# Patient Record
Sex: Female | Born: 1955 | Race: White | Hispanic: No | Marital: Married | Smoking: Current every day smoker
Health system: Southern US, Community
[De-identification: ages and names within clinical notes are randomized; demographics above are authoritative.]

## PROBLEM LIST (undated history)

## (undated) DIAGNOSIS — I10 Essential (primary) hypertension: Secondary | ICD-10-CM

## (undated) DIAGNOSIS — F419 Anxiety disorder, unspecified: Secondary | ICD-10-CM

## (undated) DIAGNOSIS — J449 Chronic obstructive pulmonary disease, unspecified: Secondary | ICD-10-CM

## (undated) HISTORY — PX: ABDOMINAL HYSTERECTOMY: SHX81

---

## 2005-02-06 ENCOUNTER — Emergency Department: Payer: Self-pay | Admitting: Emergency Medicine

## 2006-04-07 ENCOUNTER — Other Ambulatory Visit: Payer: Self-pay

## 2006-04-07 ENCOUNTER — Emergency Department: Payer: Self-pay | Admitting: Emergency Medicine

## 2006-09-15 ENCOUNTER — Emergency Department: Payer: Self-pay | Admitting: Emergency Medicine

## 2007-04-19 ENCOUNTER — Inpatient Hospital Stay: Payer: Self-pay | Admitting: Internal Medicine

## 2007-04-19 ENCOUNTER — Other Ambulatory Visit: Payer: Self-pay

## 2009-05-02 ENCOUNTER — Emergency Department: Payer: Self-pay | Admitting: Emergency Medicine

## 2009-08-04 ENCOUNTER — Emergency Department: Payer: Self-pay | Admitting: Emergency Medicine

## 2010-06-05 ENCOUNTER — Emergency Department: Payer: Self-pay | Admitting: Emergency Medicine

## 2011-11-24 ENCOUNTER — Inpatient Hospital Stay: Payer: Self-pay | Admitting: Internal Medicine

## 2011-12-10 ENCOUNTER — Emergency Department: Payer: Self-pay | Admitting: Emergency Medicine

## 2012-05-13 ENCOUNTER — Inpatient Hospital Stay: Payer: Self-pay | Admitting: Internal Medicine

## 2012-05-13 LAB — URINALYSIS, COMPLETE
Bacteria: NONE SEEN
Bilirubin,UR: NEGATIVE
Blood: NEGATIVE
Glucose,UR: NEGATIVE mg/dL
Leukocyte Esterase: NEGATIVE
Nitrite: NEGATIVE
Ph: 7
Protein: NEGATIVE
RBC,UR: <1 /HPF
Specific Gravity: 1.009
Squamous Epithelial: <1
WBC UR: 1 /HPF

## 2012-05-13 LAB — CBC
HGB: 14.5 g/dL (ref 12.0–16.0)
MCH: 30.2 pg (ref 26.0–34.0)
MCHC: 33.5 g/dL (ref 32.0–36.0)
MCV: 90 fL (ref 80–100)
RDW: 13.6 % (ref 11.5–14.5)

## 2012-05-13 LAB — COMPREHENSIVE METABOLIC PANEL
Albumin: 3.7 g/dL (ref 3.4–5.0)
Alkaline Phosphatase: 137 U/L — ABNORMAL HIGH (ref 50–136)
Bilirubin,Total: 0.4 mg/dL (ref 0.2–1.0)
Calcium, Total: 8.9 mg/dL (ref 8.5–10.1)
Chloride: 106 mmol/L (ref 98–107)
Co2: 29 mmol/L (ref 21–32)
EGFR (African American): >60
EGFR (Non-African Amer.): >60
Potassium: 3.2 mmol/L — ABNORMAL LOW (ref 3.5–5.1)
SGOT(AST): 18 U/L (ref 15–37)
Sodium: 144 mmol/L (ref 136–145)

## 2012-05-13 LAB — CK TOTAL AND CKMB (NOT AT ARMC): CK, Total: 45 U/L (ref 21–215)

## 2012-05-13 LAB — TROPONIN I: Troponin-I: <0.02 ng/mL

## 2012-05-14 LAB — BASIC METABOLIC PANEL
Anion Gap: 8 (ref 7–16)
Chloride: 107 mmol/L (ref 98–107)
Co2: 27 mmol/L (ref 21–32)
EGFR (African American): >60
Glucose: 156 mg/dL — ABNORMAL HIGH (ref 65–99)
Osmolality: 286 (ref 275–301)
Potassium: 4 mmol/L (ref 3.5–5.1)
Sodium: 142 mmol/L (ref 136–145)

## 2012-06-06 ENCOUNTER — Observation Stay: Payer: Self-pay | Admitting: Internal Medicine

## 2012-06-06 LAB — COMPREHENSIVE METABOLIC PANEL
Alkaline Phosphatase: 130 U/L (ref 50–136)
Anion Gap: 7 (ref 7–16)
BUN: 13 mg/dL (ref 7–18)
Bilirubin,Total: 1.2 mg/dL — ABNORMAL HIGH (ref 0.2–1.0)
Calcium, Total: 8.9 mg/dL (ref 8.5–10.1)
Chloride: 106 mmol/L (ref 98–107)
Co2: 28 mmol/L (ref 21–32)
Creatinine: 0.45 mg/dL — ABNORMAL LOW (ref 0.60–1.30)
EGFR (African American): >60
EGFR (Non-African Amer.): >60
Glucose: 122 mg/dL — ABNORMAL HIGH (ref 65–99)
SGPT (ALT): 20 U/L
Total Protein: 6.8 g/dL (ref 6.4–8.2)

## 2012-06-06 LAB — CBC
HCT: 45 % (ref 35.0–47.0)
HGB: 15 g/dL (ref 12.0–16.0)
MCH: 30.6 pg (ref 26.0–34.0)
MCHC: 33.2 g/dL (ref 32.0–36.0)
Platelet: 146 10*3/uL — ABNORMAL LOW (ref 150–440)
RBC: 4.89 10*6/uL (ref 3.80–5.20)
WBC: 5.2 10*3/uL (ref 3.6–11.0)

## 2012-06-11 LAB — CULTURE, BLOOD (SINGLE)

## 2012-06-24 ENCOUNTER — Inpatient Hospital Stay: Payer: Self-pay | Admitting: Internal Medicine

## 2012-06-24 LAB — BASIC METABOLIC PANEL
BUN: 14 mg/dL (ref 7–18)
Calcium, Total: 9.1 mg/dL (ref 8.5–10.1)
Chloride: 105 mmol/L (ref 98–107)
Co2: 32 mmol/L (ref 21–32)
EGFR (Non-African Amer.): >60
Glucose: 94 mg/dL (ref 65–99)
Osmolality: 283 (ref 275–301)
Potassium: 3.8 mmol/L (ref 3.5–5.1)
Sodium: 142 mmol/L (ref 136–145)

## 2012-06-25 LAB — CBC WITH DIFFERENTIAL/PLATELET
Basophil #: 0 10*3/uL (ref 0.0–0.1)
Eosinophil #: 0 10*3/uL (ref 0.0–0.7)
HCT: 38.4 % (ref 35.0–47.0)
Lymphocyte #: 0.6 10*3/uL — ABNORMAL LOW (ref 1.0–3.6)
Lymphocyte %: 6.1 %
MCH: 30.8 pg (ref 26.0–34.0)
MCHC: 33.6 g/dL (ref 32.0–36.0)
MCV: 92 fL (ref 80–100)
Monocyte #: 0.2 x10 3/mm (ref 0.2–0.9)
Monocyte %: 1.7 %
Neutrophil #: 9.5 10*3/uL — ABNORMAL HIGH (ref 1.4–6.5)
RBC: 4.18 10*6/uL (ref 3.80–5.20)
RDW: 13.3 % (ref 11.5–14.5)

## 2012-07-09 ENCOUNTER — Emergency Department: Payer: Self-pay | Admitting: Emergency Medicine

## 2012-07-09 LAB — CBC
HGB: 13.3 g/dL (ref 12.0–16.0)
MCH: 31 pg (ref 26.0–34.0)
MCV: 93 fL (ref 80–100)
Platelet: 152 10*3/uL (ref 150–440)
RDW: 14 % (ref 11.5–14.5)
WBC: 10.4 10*3/uL (ref 3.6–11.0)

## 2012-07-09 LAB — URINALYSIS, COMPLETE
Bilirubin,UR: NEGATIVE
Glucose,UR: NEGATIVE mg/dL (ref 0–75)
Ketone: NEGATIVE
Leukocyte Esterase: NEGATIVE
Ph: 6 (ref 4.5–8.0)
Protein: NEGATIVE
RBC,UR: <1 /HPF (ref 0–5)
Specific Gravity: 1.004 (ref 1.003–1.030)
Squamous Epithelial: <1
WBC UR: <1 /HPF (ref 0–5)

## 2012-07-09 LAB — BASIC METABOLIC PANEL
Anion Gap: 7 (ref 7–16)
BUN: 6 mg/dL — ABNORMAL LOW (ref 7–18)
Calcium, Total: 8.5 mg/dL (ref 8.5–10.1)
Chloride: 106 mmol/L (ref 98–107)
Co2: 31 mmol/L (ref 21–32)
Glucose: 85 mg/dL (ref 65–99)
Osmolality: 284 (ref 275–301)
Potassium: 3.5 mmol/L (ref 3.5–5.1)
Sodium: 144 mmol/L (ref 136–145)

## 2012-07-09 LAB — CK TOTAL AND CKMB (NOT AT ARMC)
CK, Total: 29 U/L (ref 21–215)
CK-MB: <0.5 ng/mL — ABNORMAL LOW (ref 0.5–3.6)

## 2012-07-10 LAB — URINE CULTURE

## 2012-08-20 ENCOUNTER — Emergency Department: Payer: Self-pay | Admitting: Emergency Medicine

## 2012-08-20 LAB — CBC WITH DIFFERENTIAL/PLATELET
Basophil %: 0.8 %
Eosinophil #: 0 10*3/uL (ref 0.0–0.7)
Eosinophil %: 0.8 %
HCT: 43.6 % (ref 35.0–47.0)
HGB: 15 g/dL (ref 12.0–16.0)
MCH: 31.8 pg (ref 26.0–34.0)
MCV: 92 fL (ref 80–100)
Monocyte %: 7.7 %
Neutrophil #: 2.4 10*3/uL (ref 1.4–6.5)
Neutrophil %: 54.8 %
Platelet: 223 10*3/uL (ref 150–440)
RBC: 4.71 10*6/uL (ref 3.80–5.20)
WBC: 4.4 10*3/uL (ref 3.6–11.0)

## 2012-08-20 LAB — COMPREHENSIVE METABOLIC PANEL
Alkaline Phosphatase: 118 U/L (ref 50–136)
BUN: 12 mg/dL (ref 7–18)
Calcium, Total: 9 mg/dL (ref 8.5–10.1)
Co2: 30 mmol/L (ref 21–32)
EGFR (African American): >60
EGFR (Non-African Amer.): >60
SGOT(AST): 28 U/L (ref 15–37)
SGPT (ALT): 15 U/L (ref 12–78)

## 2013-01-05 ENCOUNTER — Emergency Department: Payer: Self-pay | Admitting: Emergency Medicine

## 2013-04-19 ENCOUNTER — Emergency Department: Payer: Self-pay | Admitting: Emergency Medicine

## 2013-04-19 LAB — BASIC METABOLIC PANEL
Anion Gap: 12 (ref 7–16)
BUN: 10 mg/dL (ref 7–18)
Chloride: 106 mmol/L (ref 98–107)
Co2: 25 mmol/L (ref 21–32)
Creatinine: 0.69 mg/dL (ref 0.60–1.30)
EGFR (African American): >60
EGFR (Non-African Amer.): >60
Glucose: 103 mg/dL — ABNORMAL HIGH (ref 65–99)
Osmolality: 284 (ref 275–301)
Sodium: 143 mmol/L (ref 136–145)

## 2013-04-19 LAB — CBC
MCHC: 33.8 g/dL (ref 32.0–36.0)
MCV: 91 fL (ref 80–100)
Platelet: 230 10*3/uL (ref 150–440)
RBC: 5.03 10*6/uL (ref 3.80–5.20)
RDW: 13.6 % (ref 11.5–14.5)
WBC: 14.1 10*3/uL — ABNORMAL HIGH (ref 3.6–11.0)

## 2013-04-19 LAB — TROPONIN I: Troponin-I: <0.02 ng/mL

## 2013-04-25 LAB — CULTURE, BLOOD (SINGLE)

## 2013-06-08 ENCOUNTER — Inpatient Hospital Stay: Payer: Self-pay | Admitting: Internal Medicine

## 2013-06-08 LAB — COMPREHENSIVE METABOLIC PANEL
Alkaline Phosphatase: 124 U/L (ref 50–136)
BUN: 9 mg/dL (ref 7–18)
Bilirubin,Total: 0.4 mg/dL (ref 0.2–1.0)
Calcium, Total: 8.8 mg/dL (ref 8.5–10.1)
Chloride: 106 mmol/L (ref 98–107)
Co2: 28 mmol/L (ref 21–32)
Creatinine: 0.6 mg/dL (ref 0.60–1.30)
Glucose: 123 mg/dL — ABNORMAL HIGH (ref 65–99)
Osmolality: 279 (ref 275–301)
Potassium: 3.4 mmol/L — ABNORMAL LOW (ref 3.5–5.1)
SGOT(AST): 14 U/L — ABNORMAL LOW (ref 15–37)
SGPT (ALT): 14 U/L (ref 12–78)

## 2013-06-08 LAB — URINALYSIS, COMPLETE
Bacteria: NONE SEEN
Glucose,UR: >=500 mg/dL (ref 0–75)
Ketone: NEGATIVE
Nitrite: NEGATIVE
WBC UR: 6 /HPF (ref 0–5)

## 2013-06-08 LAB — TROPONIN I: Troponin-I: <0.02 ng/mL

## 2013-06-08 LAB — CBC
HCT: 41.2 % (ref 35.0–47.0)
HGB: 14.3 g/dL (ref 12.0–16.0)
MCH: 31.3 pg (ref 26.0–34.0)
MCHC: 34.6 g/dL (ref 32.0–36.0)
MCV: 90 fL (ref 80–100)
Platelet: 171 10*3/uL (ref 150–440)
RBC: 4.56 10*6/uL (ref 3.80–5.20)
RDW: 13.2 % (ref 11.5–14.5)
WBC: 10.4 10*3/uL (ref 3.6–11.0)

## 2013-06-08 LAB — CK TOTAL AND CKMB (NOT AT ARMC)
CK, Total: 36 U/L (ref 21–215)
CK-MB: 0.9 ng/mL (ref 0.5–3.6)

## 2013-06-08 LAB — PRO B NATRIURETIC PEPTIDE: B-Type Natriuretic Peptide: 239 pg/mL — ABNORMAL HIGH (ref 0–125)

## 2013-06-08 LAB — MAGNESIUM: Magnesium: 1.5 mg/dL — ABNORMAL LOW

## 2013-06-09 LAB — BASIC METABOLIC PANEL
Anion Gap: 5 — ABNORMAL LOW (ref 7–16)
BUN: 14 mg/dL (ref 7–18)
Co2: 29 mmol/L (ref 21–32)
Creatinine: 0.62 mg/dL (ref 0.60–1.30)
EGFR (African American): >60
EGFR (Non-African Amer.): >60
Glucose: 182 mg/dL — ABNORMAL HIGH (ref 65–99)
Osmolality: 285 (ref 275–301)

## 2013-06-09 LAB — CBC WITH DIFFERENTIAL/PLATELET
Basophil %: 0.3 %
Eosinophil #: 0 10*3/uL (ref 0.0–0.7)
Eosinophil %: 0 %
HCT: 40 % (ref 35.0–47.0)
Lymphocyte #: 0.5 10*3/uL — ABNORMAL LOW (ref 1.0–3.6)
Lymphocyte %: 8 %
MCH: 30.7 pg (ref 26.0–34.0)
MCHC: 33.9 g/dL (ref 32.0–36.0)
MCV: 91 fL (ref 80–100)

## 2013-06-14 ENCOUNTER — Emergency Department: Payer: Self-pay | Admitting: Emergency Medicine

## 2013-06-14 LAB — COMPREHENSIVE METABOLIC PANEL
Albumin: 3.5 g/dL (ref 3.4–5.0)
Alkaline Phosphatase: 134 U/L (ref 50–136)
Bilirubin,Total: 0.3 mg/dL (ref 0.2–1.0)
Calcium, Total: 8.8 mg/dL (ref 8.5–10.1)
Chloride: 105 mmol/L (ref 98–107)
Co2: 30 mmol/L (ref 21–32)
EGFR (African American): >60
Glucose: 110 mg/dL — ABNORMAL HIGH (ref 65–99)
Osmolality: 282 (ref 275–301)
SGOT(AST): 15 U/L (ref 15–37)
Sodium: 142 mmol/L (ref 136–145)
Total Protein: 6.7 g/dL (ref 6.4–8.2)

## 2013-06-14 LAB — CBC
HCT: 42.2 % (ref 35.0–47.0)
MCH: 30.7 pg (ref 26.0–34.0)
MCHC: 34.1 g/dL (ref 32.0–36.0)
Platelet: 283 10*3/uL (ref 150–440)
RBC: 4.69 10*6/uL (ref 3.80–5.20)
RDW: 13.2 % (ref 11.5–14.5)

## 2013-07-17 ENCOUNTER — Emergency Department: Payer: Self-pay | Admitting: Emergency Medicine

## 2013-07-17 LAB — BASIC METABOLIC PANEL WITH GFR
Anion Gap: 6 — ABNORMAL LOW (ref 7–16)
BUN: 10 mg/dL (ref 7–18)
Calcium, Total: 9.1 mg/dL (ref 8.5–10.1)
Chloride: 109 mmol/L — ABNORMAL HIGH (ref 98–107)
Co2: 27 mmol/L (ref 21–32)
Creatinine: 0.43 mg/dL — ABNORMAL LOW (ref 0.60–1.30)
EGFR (African American): >60
EGFR (Non-African Amer.): >60
Glucose: 100 mg/dL — ABNORMAL HIGH (ref 65–99)
Osmolality: 282 (ref 275–301)
Potassium: 4.3 mmol/L (ref 3.5–5.1)
Sodium: 142 mmol/L (ref 136–145)

## 2013-07-17 LAB — CBC
HCT: 44.1 % (ref 35.0–47.0)
MCHC: 34.1 g/dL (ref 32.0–36.0)
MCV: 88 fL (ref 80–100)
Platelet: 181 10*3/uL (ref 150–440)
RBC: 5 10*6/uL (ref 3.80–5.20)
RDW: 13.1 % (ref 11.5–14.5)
WBC: 5.2 10*3/uL (ref 3.6–11.0)

## 2013-07-17 LAB — TROPONIN I: Troponin-I: <0.02 ng/mL

## 2013-12-04 ENCOUNTER — Emergency Department: Payer: Self-pay | Admitting: Emergency Medicine

## 2013-12-04 LAB — URINALYSIS, COMPLETE
Bacteria: NONE SEEN
Bilirubin,UR: NEGATIVE
Glucose,UR: NEGATIVE mg/dL (ref 0–75)
Ketone: NEGATIVE
Ph: 7 (ref 4.5–8.0)
Squamous Epithelial: <1
WBC UR: 1 /HPF (ref 0–5)

## 2013-12-04 LAB — BASIC METABOLIC PANEL
Anion Gap: 4 — ABNORMAL LOW (ref 7–16)
BUN: 10 mg/dL (ref 7–18)
Calcium, Total: 9 mg/dL (ref 8.5–10.1)
Chloride: 106 mmol/L (ref 98–107)
Sodium: 139 mmol/L (ref 136–145)

## 2013-12-04 LAB — CBC WITH DIFFERENTIAL/PLATELET
Basophil #: 0.1 10*3/uL (ref 0.0–0.1)
Basophil %: 0.9 %
Eosinophil #: 0 10*3/uL (ref 0.0–0.7)
Eosinophil %: 0.5 %
HCT: 43.8 % (ref 35.0–47.0)
Lymphocyte #: 1.4 10*3/uL (ref 1.0–3.6)
Lymphocyte %: 24 %
MCH: 31.1 pg (ref 26.0–34.0)
MCHC: 34.6 g/dL (ref 32.0–36.0)
MCV: 90 fL (ref 80–100)
Monocyte #: 0.4 x10 3/mm (ref 0.2–0.9)
RBC: 4.87 10*6/uL (ref 3.80–5.20)
RDW: 13.7 % (ref 11.5–14.5)
WBC: 5.7 10*3/uL (ref 3.6–11.0)

## 2014-02-04 ENCOUNTER — Emergency Department: Payer: Self-pay | Admitting: Emergency Medicine

## 2014-02-04 LAB — COMPREHENSIVE METABOLIC PANEL
ALBUMIN: 3.7 g/dL (ref 3.4–5.0)
ANION GAP: 3 — AB (ref 7–16)
Alkaline Phosphatase: 129 U/L — ABNORMAL HIGH
BUN: 14 mg/dL (ref 7–18)
Bilirubin,Total: 0.2 mg/dL (ref 0.2–1.0)
CO2: 32 mmol/L (ref 21–32)
CREATININE: 0.63 mg/dL (ref 0.60–1.30)
Calcium, Total: 9.1 mg/dL (ref 8.5–10.1)
Chloride: 107 mmol/L (ref 98–107)
EGFR (African American): >60
EGFR (Non-African Amer.): >60
Glucose: 125 mg/dL — ABNORMAL HIGH (ref 65–99)
Osmolality: 285 (ref 275–301)
POTASSIUM: 3.7 mmol/L (ref 3.5–5.1)
SGOT(AST): 13 U/L — ABNORMAL LOW (ref 15–37)
SGPT (ALT): 15 U/L (ref 12–78)
Sodium: 142 mmol/L (ref 136–145)
TOTAL PROTEIN: 6.5 g/dL (ref 6.4–8.2)

## 2014-02-04 LAB — TROPONIN I

## 2014-02-05 LAB — CBC
HCT: 41.4 % (ref 35.0–47.0)
HGB: 14.1 g/dL (ref 12.0–16.0)
MCH: 31.7 pg (ref 26.0–34.0)
MCHC: 34.1 g/dL (ref 32.0–36.0)
MCV: 93 fL (ref 80–100)
Platelet: 217 10*3/uL (ref 150–440)
RBC: 4.46 10*6/uL (ref 3.80–5.20)
RDW: 13.3 % (ref 11.5–14.5)
WBC: 6.2 10*3/uL (ref 3.6–11.0)

## 2014-05-30 ENCOUNTER — Observation Stay: Payer: Self-pay | Admitting: Internal Medicine

## 2014-05-30 LAB — BASIC METABOLIC PANEL
ANION GAP: 6 — AB (ref 7–16)
BUN: 10 mg/dL (ref 7–18)
CREATININE: 0.5 mg/dL — AB (ref 0.60–1.30)
Calcium, Total: 8.7 mg/dL (ref 8.5–10.1)
Chloride: 107 mmol/L (ref 98–107)
Co2: 29 mmol/L (ref 21–32)
EGFR (African American): >60
Glucose: 92 mg/dL (ref 65–99)
Osmolality: 282 (ref 275–301)
POTASSIUM: 3.4 mmol/L — AB (ref 3.5–5.1)
Sodium: 142 mmol/L (ref 136–145)

## 2014-05-30 LAB — CBC
HCT: 42.6 % (ref 35.0–47.0)
HGB: 14 g/dL (ref 12.0–16.0)
MCH: 30.5 pg (ref 26.0–34.0)
MCHC: 33 g/dL (ref 32.0–36.0)
MCV: 93 fL (ref 80–100)
Platelet: 193 10*3/uL (ref 150–440)
RBC: 4.6 10*6/uL (ref 3.80–5.20)
RDW: 13 % (ref 11.5–14.5)
WBC: 4.6 10*3/uL (ref 3.6–11.0)

## 2014-05-30 LAB — TROPONIN I: Troponin-I: <0.02 ng/mL

## 2014-05-31 LAB — CBC WITH DIFFERENTIAL/PLATELET
BASOS PCT: 0.1 %
Basophil #: 0 10*3/uL (ref 0.0–0.1)
EOS PCT: 0 %
Eosinophil #: 0 10*3/uL (ref 0.0–0.7)
HCT: 38.2 % (ref 35.0–47.0)
HGB: 12.6 g/dL (ref 12.0–16.0)
LYMPHS PCT: 11.6 %
Lymphocyte #: 0.9 10*3/uL — ABNORMAL LOW (ref 1.0–3.6)
MCH: 30.5 pg (ref 26.0–34.0)
MCHC: 33.1 g/dL (ref 32.0–36.0)
MCV: 92 fL (ref 80–100)
Monocyte #: 0.5 x10 3/mm (ref 0.2–0.9)
Monocyte %: 6.7 %
Neutrophil #: 6.6 10*3/uL — ABNORMAL HIGH (ref 1.4–6.5)
Neutrophil %: 81.6 %
Platelet: 194 10*3/uL (ref 150–440)
RBC: 4.15 10*6/uL (ref 3.80–5.20)
RDW: 13 % (ref 11.5–14.5)
WBC: 8.1 10*3/uL (ref 3.6–11.0)

## 2014-05-31 LAB — BASIC METABOLIC PANEL
ANION GAP: 3 — AB (ref 7–16)
BUN: 9 mg/dL (ref 7–18)
CALCIUM: 9.1 mg/dL (ref 8.5–10.1)
Chloride: 106 mmol/L (ref 98–107)
Co2: 32 mmol/L (ref 21–32)
Creatinine: 0.54 mg/dL — ABNORMAL LOW (ref 0.60–1.30)
EGFR (African American): >60
EGFR (Non-African Amer.): >60
Glucose: 152 mg/dL — ABNORMAL HIGH (ref 65–99)
OSMOLALITY: 283 (ref 275–301)
Potassium: 3.9 mmol/L (ref 3.5–5.1)
Sodium: 141 mmol/L (ref 136–145)

## 2014-09-05 ENCOUNTER — Inpatient Hospital Stay: Payer: Self-pay | Admitting: Internal Medicine

## 2014-09-05 LAB — CBC
HCT: 40.1 % (ref 35.0–47.0)
HGB: 13.8 g/dL (ref 12.0–16.0)
MCH: 31.7 pg (ref 26.0–34.0)
MCHC: 34.5 g/dL (ref 32.0–36.0)
MCV: 92 fL (ref 80–100)
Platelet: 188 10*3/uL (ref 150–440)
RBC: 4.36 10*6/uL (ref 3.80–5.20)
RDW: 13.3 % (ref 11.5–14.5)
WBC: 7.6 10*3/uL (ref 3.6–11.0)

## 2014-09-05 LAB — BASIC METABOLIC PANEL
Anion Gap: 8 (ref 7–16)
BUN: 14 mg/dL (ref 7–18)
CALCIUM: 8.3 mg/dL — AB (ref 8.5–10.1)
Chloride: 109 mmol/L — ABNORMAL HIGH (ref 98–107)
Co2: 29 mmol/L (ref 21–32)
Creatinine: 0.59 mg/dL — ABNORMAL LOW (ref 0.60–1.30)
EGFR (African American): >60
EGFR (Non-African Amer.): >60
Glucose: 95 mg/dL (ref 65–99)
OSMOLALITY: 291 (ref 275–301)
Potassium: 3.6 mmol/L (ref 3.5–5.1)
Sodium: 146 mmol/L — ABNORMAL HIGH (ref 136–145)

## 2014-09-05 LAB — TROPONIN I: Troponin-I: <0.02 ng/mL

## 2015-01-11 ENCOUNTER — Inpatient Hospital Stay: Payer: Self-pay | Admitting: Internal Medicine

## 2015-01-11 LAB — CBC
HCT: 44.7 % (ref 35.0–47.0)
HGB: 14.7 g/dL (ref 12.0–16.0)
MCH: 30.4 pg (ref 26.0–34.0)
MCHC: 32.8 g/dL (ref 32.0–36.0)
MCV: 93 fL (ref 80–100)
PLATELETS: 181 10*3/uL (ref 150–440)
RBC: 4.82 10*6/uL (ref 3.80–5.20)
RDW: 13 % (ref 11.5–14.5)
WBC: 3.8 10*3/uL (ref 3.6–11.0)

## 2015-01-11 LAB — BASIC METABOLIC PANEL
Anion Gap: 4 — ABNORMAL LOW (ref 7–16)
BUN: 9 mg/dL (ref 7–18)
CHLORIDE: 108 mmol/L — AB (ref 98–107)
CO2: 32 mmol/L (ref 21–32)
CREATININE: 0.48 mg/dL — AB (ref 0.60–1.30)
Calcium, Total: 8.5 mg/dL (ref 8.5–10.1)
EGFR (African American): >60
EGFR (Non-African Amer.): >60
Glucose: 90 mg/dL (ref 65–99)
Osmolality: 285 (ref 275–301)
Potassium: 4.1 mmol/L (ref 3.5–5.1)
SODIUM: 144 mmol/L (ref 136–145)

## 2015-01-11 LAB — TROPONIN I: Troponin-I: <0.02 ng/mL

## 2015-01-11 LAB — PRO B NATRIURETIC PEPTIDE: B-Type Natriuretic Peptide: 132 pg/mL — ABNORMAL HIGH (ref 0–125)

## 2015-01-12 DIAGNOSIS — R000 Tachycardia, unspecified: Secondary | ICD-10-CM

## 2015-01-12 DIAGNOSIS — J80 Acute respiratory distress syndrome: Secondary | ICD-10-CM

## 2015-01-12 LAB — BASIC METABOLIC PANEL
ANION GAP: 8 (ref 7–16)
BUN: 9 mg/dL (ref 7–18)
CHLORIDE: 104 mmol/L (ref 98–107)
CO2: 28 mmol/L (ref 21–32)
CREATININE: 0.66 mg/dL (ref 0.60–1.30)
Calcium, Total: 9.3 mg/dL (ref 8.5–10.1)
EGFR (African American): >60
EGFR (Non-African Amer.): >60
GLUCOSE: 141 mg/dL — AB (ref 65–99)
Osmolality: 280 (ref 275–301)
POTASSIUM: 3.7 mmol/L (ref 3.5–5.1)
SODIUM: 140 mmol/L (ref 136–145)

## 2015-01-12 LAB — CBC WITH DIFFERENTIAL/PLATELET
BASOS PCT: 0.1 %
Basophil #: 0 10*3/uL (ref 0.0–0.1)
Eosinophil #: 0 10*3/uL (ref 0.0–0.7)
Eosinophil %: 0.1 %
HCT: 42.9 % (ref 35.0–47.0)
HGB: 14.2 g/dL (ref 12.0–16.0)
LYMPHS ABS: 0.3 10*3/uL — AB (ref 1.0–3.6)
LYMPHS PCT: 11.3 %
MCH: 30.4 pg (ref 26.0–34.0)
MCHC: 33.1 g/dL (ref 32.0–36.0)
MCV: 92 fL (ref 80–100)
MONOS PCT: 1.6 %
Monocyte #: 0 x10 3/mm — ABNORMAL LOW (ref 0.2–0.9)
Neutrophil #: 2.4 10*3/uL (ref 1.4–6.5)
Neutrophil %: 86.9 %
Platelet: 158 10*3/uL (ref 150–440)
RBC: 4.66 10*6/uL (ref 3.80–5.20)
RDW: 12.9 % (ref 11.5–14.5)
WBC: 2.7 10*3/uL — AB (ref 3.6–11.0)

## 2015-01-12 LAB — CK TOTAL AND CKMB (NOT AT ARMC)
CK, TOTAL: 38 U/L (ref 26–192)
CK-MB: 0.9 ng/mL (ref 0.5–3.6)

## 2015-01-12 LAB — TROPONIN I
TROPONIN-I: 0.04 ng/mL
Troponin-I: <0.02 ng/mL
Troponin-I: 0.03 ng/mL

## 2015-01-14 LAB — MAGNESIUM: Magnesium: 1.9 mg/dL

## 2015-01-14 LAB — CBC WITH DIFFERENTIAL/PLATELET
BASOS PCT: 0.4 %
Basophil #: 0 10*3/uL (ref 0.0–0.1)
Eosinophil #: 0.1 10*3/uL (ref 0.0–0.7)
Eosinophil %: 0.7 %
HCT: 48.2 % — AB (ref 35.0–47.0)
HGB: 16.2 g/dL — AB (ref 12.0–16.0)
LYMPHS ABS: 2.1 10*3/uL (ref 1.0–3.6)
LYMPHS PCT: 26.3 %
MCH: 30.8 pg (ref 26.0–34.0)
MCHC: 33.5 g/dL (ref 32.0–36.0)
MCV: 92 fL (ref 80–100)
MONO ABS: 0.6 x10 3/mm (ref 0.2–0.9)
Monocyte %: 7.2 %
NEUTROS ABS: 5.3 10*3/uL (ref 1.4–6.5)
Neutrophil %: 65.4 %
Platelet: 192 10*3/uL (ref 150–440)
RBC: 5.24 10*6/uL — ABNORMAL HIGH (ref 3.80–5.20)
RDW: 13 % (ref 11.5–14.5)
WBC: 8 10*3/uL (ref 3.6–11.0)

## 2015-01-14 LAB — LIPASE, BLOOD: LIPASE: 90 U/L (ref 73–393)

## 2015-01-14 LAB — PROTIME-INR
INR: 0.9
PROTHROMBIN TIME: 12.2 s (ref 11.5–14.7)

## 2015-01-15 ENCOUNTER — Inpatient Hospital Stay: Payer: Self-pay | Admitting: Internal Medicine

## 2015-01-15 LAB — COMPREHENSIVE METABOLIC PANEL
ALBUMIN: 4.1 g/dL (ref 3.4–5.0)
ALK PHOS: 120 U/L — AB
ANION GAP: 7 (ref 7–16)
AST: 28 U/L (ref 15–37)
BUN: 9 mg/dL (ref 7–18)
Bilirubin,Total: 0.4 mg/dL (ref 0.2–1.0)
CALCIUM: 9.1 mg/dL (ref 8.5–10.1)
CHLORIDE: 103 mmol/L (ref 98–107)
CO2: 33 mmol/L — AB (ref 21–32)
Creatinine: 0.63 mg/dL (ref 0.60–1.30)
EGFR (African American): >60
Glucose: 89 mg/dL (ref 65–99)
Osmolality: 283 (ref 275–301)
POTASSIUM: 3.5 mmol/L (ref 3.5–5.1)
SGPT (ALT): 19 U/L
SODIUM: 143 mmol/L (ref 136–145)
Total Protein: 7.4 g/dL (ref 6.4–8.2)

## 2015-01-15 LAB — CK-MB
CK-MB: 1.1 ng/mL (ref 0.5–3.6)
CK-MB: 1.1 ng/mL (ref 0.5–3.6)
CK-MB: 0.9 ng/mL (ref 0.5–3.6)

## 2015-01-15 LAB — TROPONIN I
Troponin-I: <0.02 ng/mL
Troponin-I: <0.02 ng/mL

## 2015-01-16 LAB — CBC WITH DIFFERENTIAL/PLATELET
BASOS PCT: 0 %
Basophil #: 0 10*3/uL (ref 0.0–0.1)
EOS PCT: 0 %
Eosinophil #: 0 10*3/uL (ref 0.0–0.7)
HCT: 39.9 % (ref 35.0–47.0)
HGB: 13.2 g/dL (ref 12.0–16.0)
Lymphocyte #: 0.8 10*3/uL — ABNORMAL LOW (ref 1.0–3.6)
Lymphocyte %: 11.5 %
MCH: 30.1 pg (ref 26.0–34.0)
MCHC: 33.1 g/dL (ref 32.0–36.0)
MCV: 91 fL (ref 80–100)
MONO ABS: 0.4 x10 3/mm (ref 0.2–0.9)
Monocyte %: 6.5 %
NEUTROS ABS: 5.6 10*3/uL (ref 1.4–6.5)
Neutrophil %: 82 %
Platelet: 194 10*3/uL (ref 150–440)
RBC: 4.39 10*6/uL (ref 3.80–5.20)
RDW: 12.7 % (ref 11.5–14.5)
WBC: 6.9 10*3/uL (ref 3.6–11.0)

## 2015-01-16 LAB — BASIC METABOLIC PANEL
ANION GAP: 7 (ref 7–16)
BUN: 14 mg/dL (ref 7–18)
CALCIUM: 9 mg/dL (ref 8.5–10.1)
CO2: 32 mmol/L (ref 21–32)
Chloride: 102 mmol/L (ref 98–107)
Creatinine: 0.69 mg/dL (ref 0.60–1.30)
EGFR (Non-African Amer.): >60
Glucose: 155 mg/dL — ABNORMAL HIGH (ref 65–99)
Osmolality: 285 (ref 275–301)
Potassium: 3.3 mmol/L — ABNORMAL LOW (ref 3.5–5.1)
Sodium: 141 mmol/L (ref 136–145)

## 2015-02-01 ENCOUNTER — Inpatient Hospital Stay: Payer: Self-pay | Admitting: Internal Medicine

## 2015-02-11 ENCOUNTER — Emergency Department: Payer: Self-pay | Admitting: Emergency Medicine

## 2015-03-06 ENCOUNTER — Emergency Department: Payer: Self-pay | Admitting: Emergency Medicine

## 2015-03-08 ENCOUNTER — Emergency Department: Payer: Self-pay | Admitting: Emergency Medicine

## 2015-03-10 ENCOUNTER — Emergency Department: Payer: Self-pay | Admitting: Internal Medicine

## 2015-03-17 ENCOUNTER — Emergency Department: Payer: Self-pay | Admitting: Emergency Medicine

## 2015-03-17 LAB — CBC WITH DIFFERENTIAL/PLATELET
BASOS PCT: 1 %
Basophil #: 0.1 10*3/uL (ref 0.0–0.1)
EOS PCT: 0.6 %
Eosinophil #: 0 10*3/uL (ref 0.0–0.7)
HCT: 47.6 % — AB (ref 35.0–47.0)
HGB: 15.6 g/dL (ref 12.0–16.0)
LYMPHS PCT: 15.9 %
Lymphocyte #: 1.2 10*3/uL (ref 1.0–3.6)
MCH: 29.5 pg (ref 26.0–34.0)
MCHC: 32.7 g/dL (ref 32.0–36.0)
MCV: 90 fL (ref 80–100)
MONOS PCT: 8.3 %
Monocyte #: 0.7 x10 3/mm (ref 0.2–0.9)
NEUTROS ABS: 5.8 10*3/uL (ref 1.4–6.5)
Neutrophil %: 74.2 %
Platelet: 272 10*3/uL (ref 150–440)
RBC: 5.29 10*6/uL — AB (ref 3.80–5.20)
RDW: 13.6 % (ref 11.5–14.5)
WBC: 7.9 10*3/uL (ref 3.6–11.0)

## 2015-03-17 LAB — COMPREHENSIVE METABOLIC PANEL WITH GFR
Albumin: 4.5 g/dL
Alkaline Phosphatase: 94 U/L
Anion Gap: 9
BUN: 8 mg/dL
Bilirubin,Total: 0.6 mg/dL
Calcium, Total: 9.6 mg/dL
Chloride: 101 mmol/L
Co2: 31 mmol/L
Creatinine: 0.56 mg/dL
EGFR (African American): >60
EGFR (Non-African Amer.): >60
Glucose: 133 mg/dL — ABNORMAL HIGH
Potassium: 3.5 mmol/L
SGOT(AST): 22 U/L
SGPT (ALT): 15 U/L
Sodium: 141 mmol/L
Total Protein: 7 g/dL

## 2015-03-17 LAB — TROPONIN I: Troponin-I: <0.03 ng/mL

## 2015-03-17 LAB — MAGNESIUM: MAGNESIUM: 1.9 mg/dL

## 2015-03-17 LAB — LIPASE, BLOOD: LIPASE: 45 U/L

## 2015-04-15 NOTE — Discharge Summary (Signed)
PATIENT NAME:  Ashley [name], Kaniya E MR#:  [id number] DATE OF BIRTH:  May 05, 1956  DATE OF ADMISSION:  06/08/2013 DATE OF DISCHARGE:  06/09/2013  PRIMARY CARE PHYSICIAN:  None local.  DISCHARGE DIAGNOSES:  1.  Chronic obstructive pulmonary disease exacerbation.  2.  Tachycardia.  3.  Hypertension.   CONDITION: Stable.   CODE STATUS: Full code.   HOME MEDICATIONS: 1.  DuoNeb 0.5 mg/2.5 mg/3 mL inhalations solution 3 mL inhaled  4 times a day p.r.n. for shortness of breath.  2.  Spiriva 18 mcg inhalation one inhaled once a day.  3.  Albuterol CFC-free 19 mcg inhalation aerosol 2 puffs 4 times a day p.r.n. for shortness of breath.  4.  Advair 250 mcg/50 mcg 1 puff b.i.d.  5.  Prednisone 20 mg p.o. 1 tablet daily for two days.   DIET: Low-sodium diet.   ACTIVITY: As tolerated.   FOLLOW-UP CARE: Follow with PCP within 1 to 2 weeks.   REASON FOR ADMISSION: Shortness of breath.   HOSPITAL COURSE: The patient is a 59 year old Caucasian female with a history of chronic obstructive pulmonary disease, hypertension, presented to the ED with shortness of breath starting last weekend and worsening over the weekend, so she came to the ED for further evaluation. In the ED, she still had wheezing and did not feel comfort going home, so she was admitted for chronic obstructive pulmonary disease exacerbation. For detailed history and physical examination, please refer to the admission note dictated by Dr. Sherryll BurgerShah. The patient's chest x-ray did not show any acute pulmonary disease, only chronic obstructive pulmonary disease.   LABORATORY AND RADIOLOGICAL DATA: Showed normal BNP, except potassium 3.4, and blood sugar 12, BNP 239. ABG showed pH 7.39, pCO2 44, pO2 103.   The patient was admitted for chronic obstructive pulmonary disease exacerbation. On admission, the patient has been treated with Solu-Medrol, Spiriva, Advair and DuoNeb. In addition, the patient has been treated with oxygen by nasal cannula, 2 liters.  Today, the patient is feeling much better. No shortness of breath, cough or wheezing. The patient has been off oxygen for the whole day without shortness of breath. The patient's hypokalemia was treated with potassium supplement and potassium level was normal today. The patient in a sinus tachycardia on admission, but improved after treatment for chronic obstructive pulmonary disease.    The patient is clinically stable. She will be discharged to home today. I discussed the patient's discharge plan with the patient, case manager and nurse.   TIME SPENT: About 33 minutes   ____________________________ Shaune PollackQing Iyanah Demont, MD qc:cc D: 06/09/2013 16:05:24 ET T: 06/09/2013 17:10:56 ET JOB#: [id number]  cc: Shaune PollackQing Jaree Dwight, MD, <Dictator> Shaune PollackQING Taisia Fantini MD ELECTRONICALLY SIGNED 06/10/2013 16:21

## 2015-04-15 NOTE — H&P (Signed)
PATIENT NAME:  Ashley [name], APGAR MR#:  [id number] DATE OF BIRTH:  Sep 25, 1956  DATE OF ADMISSION:  06/08/2013  PRIMARY CARE PHYSICIAN:  [facility]  PULMONARY:  [facility]  REQUESTING PHYSICIAN:  Dr. Suella Broad   CHIEF COMPLAINT:  Shortness of breath.   HISTORY OF PRESENT ILLNESS:  The patient is a 59 year old female with a known history of COPD. She is being admitted for COPD exacerbation. The patient was doing well until the end of last week when she started feeling short of breath. She had to keep her grandson, who  was running a cold and had some norovirus infection last week for about 3 days, on Tuesday, Wednesday, Thursday. On Friday, she felt like she was catching some infection and started feeling short of breath. This kept getting worse over the weekend and this morning she could not catch her breath. Usually Spiriva works, but it did not seem to help her today and she came down to the Emergency Department, as she could not breathe. While in the ED, she was still wheezing and did not feel comfortable going home, and is being admitted for further evaluation and management.   She feels some chest tightness, also has some dry cough. Denies any fever.   PAST MEDICAL HISTORY: 1.  COPD.   2.  Hypertension.   PAST SURGICAL HISTORY:  Hysterectomy.   ALLERGIES:  No known drug allergies.   SOCIAL HISTORY:  She is a former smoker. No alcohol drinking or illicit drug use.   FAMILY HISTORY:  Mother with heart disease. Father died of MI. Brother has cancer.   MEDICATIONS AT HOME:   1.  Advair 250/50 one puff b.i.d.  2.  Albuterol 2 puffs inhaled 4 times a day as needed. 3.  DuoNeb inhaled 4 times a day as needed. 4.  Spiriva once daily.   REVIEW OF SYSTEMS: CONSTITUTIONAL:  No fever, fatigue, weakness.  EYES:  No blurred or double vision.  EARS, NOSE, THROAT:  No tinnitus or ear pain.  RESPIRATORY: Positive for dry cough occasionally. Positive for wheezing. No hemoptysis.  Positive for dyspnea. CARDIOVASCULAR:  Some chest tightness present. No orthopnea or edema.  GASTROINTESTINAL:  No nausea, vomiting, diarrhea.  GENITOURINARY:  No dysuria or hematuria. ENDOCRINE:  No polyuria or nocturia.  HEMATOLOGY:  No anemia or easy bruising.   SKIN:  No rash or lesion.  MUSCULOSKELETAL:  No arthritis or muscle cramp.  NEUROLOGIC:  No tingling, numbness, weakness.  PSYCHIATRIC:  No history of anxiety or depression.   PHYSICAL EXAMINATION: VITAL SIGNS:  Temperature 98.7, heart rate 111 per minute, respirations 18 per minute, blood pressure 140/97 mmHg. She is saturating 94% on 2 liters oxygen via nasal cannula. Her room air oxygen saturation was 92%.  GENERAL:  The patient is a 59 year old female lying in the bed comfortably, without any acute distress.  EYES:  Pupils equal, round, react to light and accommodation. No scleral icterus. Extraocular muscles intact.  HEENT:  Head atraumatic, normocephalic. Oropharynx and nasopharynx clear.  NECK:  Supple. No jugular venous distention. No thyroid enlargement or tenderness.  LUNGS: Decreased breath sounds at bases. Bilateral expiratory wheezing. No rales or rhonchi.  CARDIOVASCULAR:  S1, S2 normal, tachycardic. No murmurs, rubs or gallop.  ABDOMEN:  Soft, nontender, nondistended. Bowel sounds present. No organomegaly or masses.  EXTREMITIES:  No pedal edema, cyanosis or clubbing.  NEUROLOGIC:  Cranial nerves II through XII intact. Muscle strength 5/5 in all extremities. Sensation intact.  PSYCHIATRIC:  The patient is alert and oriented to time, place and person x 3.  SKIN:  No obvious rash, lesion or ulcer.   LABORATORY PANEL:  Normal BMP, except potassium of 3.4. Blood sugar of 123. BNP of 239. We have normal liver function tests. Normal first set of cardiac enzymes. Normal CBC. ABG showed pH of 7.39, pCO2 of 44, pO2 of 103 and bicarb of 26.6.   Chest x-ray did not show any acute cardiopulmonary disease.   EKG showed sinus  tachycardia, rate of 110 per minute, right atrial enlargement, no major ST-T changes.   IMPRESSION AND PLAN: 1.  Chronic obstructive pulmonary disease exacerbation. Will continue her Advair and Spiriva. Will start her on IV Solu-Medrol, nebulizer breathing treatment and monitor oxygen saturation. Will keep her on off-unit telemetry.  2.  Sinus tachycardia, likely due to chronic obstructive pulmonary disease flare. Will monitor on off-unit telemetry.  3.  Hypokalemia. Will replete and recheck. Will check her magnesium.  4.  CODE STATUS:  FULL CODE.   Total time taking care of this patient is 55 minutes.   ____________________________ Ellamae SiaVipul S. Sherryll BurgerShah, MD vss:mr D: 06/08/2013 19:16:00 ET T: 06/08/2013 20:08:54 ET JOB#: [id number]  cc: [facility] Vaneta Hammontree S. Sherryll BurgerShah, MD, <Dictator> [facility]     Ellamae SiaVIPUL S [facility] MD ELECTRONICALLY SIGNED 06/10/2013 12:33

## 2015-04-16 NOTE — Discharge Summary (Signed)
PATIENT NAME:  Ashley [name], Ashley [name] MR#:  [id number] DATE OF BIRTH:  04/21/1956  DATE OF ADMISSION:  09/05/2014 DATE OF DISCHARGE:  09/06/2014  For a detailed note please see the history and physical done on admission by Dr. Angelica Ranavid Hower.   DIAGNOSIS AT DISCHARGE: Acute chronic obstructive pulmonary disease exacerbation secondary to bronchitis. Hypertension.   DISCHARGE INSTRUCTIONS: The patient is being discharged on a low-sodium diet. Activity as tolerated. Followup is with her primary care physician in next 1-2 weeks.   DISCHARGE MEDICATIONS: Aspirin 81 mg daily, albuterol inhaler 2 puffs 4 times daily as needed, Advair 250 one puff b.i.d., Norvasc 5 mg daily, DuoNebs q.i.d. as needed, Spiriva 1 puff daily, prednisone taper starting at 60 mg down to 10 mg over the next 6 days, Zithromax 500 mg daily x 3 days, lisinopril 10 mg daily, and HCTZ 12.5 mg daily.    PERTINENT STUDIES DURING THE HOSPITAL COURSE: Chest x-ray done on admission showing severe emphysema without acute cardiopulmonary disease.    HOSPITAL COURSE: This is a 59 year old female with medical problems as mentioned above who presented to the hospital with shortness of breath and noted to be in COPD exacerbation.    1. COPD exacerbation. This was likely secondary to an acute bronchitis. The patient apparently has a husband who has been suffering from upper respiratory illness. She presented with a dry cough and says that her shortness of breath felt worse when she had a cough. She was admitted to the hospital, started on IV steroids and around-the-clock nebulizer treatments, maintained on Advair and Spiriva and on empiric Zithromax. After maximal therapy the patient's clinical symptoms have improved. She was ambulated on room air, did not desaturate below 88%. Since she is less bronchospastic and feeling better she is being discharged home on oral prednisone taper along with oral Zithromax as stated.   2. Hypertension. The patient remained  hemodynamically stable. She will continue on lisinopril, HCTZ, and Norvasc as stated.   CODE STATUS: The patient is a full code.   TIME SPENT ON DISCHARGE: 40 minutes.     ____________________________ Rolly PancakeVivek J. Cherlynn KaiserSainani, MD vjs:bu D: 09/06/2014 15:24:33 ET T: 09/06/2014 19:24:54 ET JOB#: [id number]  cc: Rolly PancakeVivek J. Cherlynn KaiserSainani, MD, <Dictator> Primary care physician Houston SirenVIVEK J Welborn Keena MD ELECTRONICALLY SIGNED 09/13/2014 9:42

## 2015-04-16 NOTE — H&P (Signed)
PATIENT NAME:  Ashley [name], Ashley [name] MR#:  [id number] DATE OF BIRTH:  Jan 16, 1956  DATE OF ADMISSION:  05/30/2014  REASON FOR ADMISSION: Shortness of breath.    PRIMARY CARE PHYSICIAN: [facility], but now she goes to [facility]. Her pulmonologist is on [facility]. Her cardiologist is Dr.khan, who is monitoring her blood pressure.    CHIEF COMPLAINT: Shortness of breath.   HISTORY OF PRESENT ILLNESS:  This is a very nice 59 year old female with history of chronic obstructive pulmonary disease, hypertension, who comes today with a history of not feeling very good. She states that for past 6 months she quit smoking and she has been doing great, but she recently started smoking again. Yesterday, she had increased work of breathing. She needed to use her nebulizer more than 10 times. Last night, she had some worsening of the symptoms and this morning she has been hurting a lot around her chest wall whenever she takes deep breaths, She states that she is not coughing and she has not had any sputum or wheezing. She just have a lot of difficulty breathing, getting air in.  On examination, the patient actually is a little bit tight on her chest. There is no wheezing but her air entrance is diminished all over. When evaluated by the Emergency Room physician, the patient is doing overall okay. She came via EMS who reported that the patient has been at 97% on room air and 98% to 99% on 2 liters; received 2 DuoNebs and her breathing has improved and received 1 dose of Solu-Medrol as well. The patient is admitted for observation as she is still not feeling quite well.   REVIEW OF SYSTEMS:  A 12-system review of systems is done.    CONSTITUTIONAL: No fever, fatigue, weakness, weight loss or weight gain.  EYES:  No blurry vision, double vision.  EARS, NOSE, THROAT: No difficulty swallowing or tinnitus.  RESPIRATORY: Positive cough no wheezing. No hemoptysis. Positive dyspnea. Positive painful respirations. Positive chronic  obstructive pulmonary disease.  CARDIOVASCULAR: No chest pain, orthopnea, edema or arrhythmias. The patient does have some soreness around the rib cage due to increased work of breathing.  GENITOURINARY: No dysuria, hematuria or changes in frequency.  GASTROINTESTINAL:  No abdominal pain, constipation or diarrhea.  GYNECOLOGIC:  No breast masses.  ENDOCRINE: No polyuria, polydipsia or polyphagia.  HEMATOLOGIC AND LYMPHATIC: No anemia, easy bruising or bleeding.  SKIN: No rashes or petechiae.  MUSCULOSKELETAL: No neck pain or back pain.  NEUROLOGIC: No numbness, tingling, vertigo, cerebrovascular accident.  PSYCHIATRIC: No insomnia or depression.   PAST MEDICAL HISTORY: 1.  Chronic obstructive pulmonary disease.  2.  Hypertension.  3.  The patient states that she has what she calls leaky valve and she has been seen by cardiology service.   PAST SURGICAL HISTORY:  Teeth surgery and hysterectomy.   ALLERGIES: Not known drug allergies.   SOCIAL HISTORY: The patient lives with her husband. She has not been able to work since 2008 due to her shortness of breath. She is smokes on and off. She started smoking recently last month. She smokes 1 pack a day. Smoking cessation counseling given to the patient for over 5 minutes. The patient states that she just quits all the time and starts smoking again. She has been smoking since she was an early teenager. Denies any alcohol or drugs.   FAMILY HISTORY: Positive for father who died from myocardial infarction. Her mother had heart disease as well and her brother had  prostate cancer. Her daughter has melanoma.   MEDICATIONS: Advair 250/50 twice daily, albuterol 2 puffs 4 times a day as needed, DuoNebs at home as needed, Spiriva 18 mcg daily, Norvasc 5 mg daily, benazepril with HCTZ once daily.   PHYSICAL EXAMINATION: GENERAL:  The patient is alert, oriented x 3, in no acute distress. No respiratory distress. She is hemodynamically stable.  VITAL SIGNS:   Her blood pressure is 146/93, pulse 76, respirations 20, temperature 98.4, oxygen saturation 99% on 2 liters of oxygen.  HEENT: Her pupils are equal and reactive. Extraocular movements are intact. Mucosa are a little dry. Anicteric sclerae. Pink conjunctivae. No oral lesions. No oropharyngeal exudates.  NECK: Supple. No JVD. No thyromegaly. No adenopathy. No carotid bruits.  CARDIOVASCULAR: Regular rate and rhythm. No murmurs, rubs or gallops are appreciated. No displacement of PMI.  LUNGS: Clear at this moment, but decreased air entrance all throughout the lungs. No signs of consolidation. No dullness to percussion. I cannot hear any wheezing but, again, her air entrance is diminished all over her lungs. No use of accessory muscles at this moment.  ABDOMEN: Soft, nontender, nondistended. No hepatosplenomegaly. No masses. Bowel sounds are positive.  GENITAL: Deferred.  EXTREMITIES: No edema, cyanosis or clubbing.  VASCULAR: Pulses +1. Capillary refill less than 3.  MUSCULOSKELETAL: No joint effusions or joint swelling.  SKIN: No rashes or petechiae.  NEUROLOGIC: Cranial nerves II through XII intact. No focal findings.  PSYCHIATRIC: No agitation. The patient is alert, oriented x 3.  LYMPHATIC: Negative for lymphadenopathy in the neck or supraclavicular areas.   LABORATORY, DIAGNOSTIC AND RADIOLOGICAL DATA:  Creatinine 0.5, sodium 142, potassium 3.4, glucose of 92. Troponin is negative x 1. White count 4.6, hemoglobin 14, platelet count 193. EKG normal sinus rhythm, criteria for left ventricular hypertrophy is present. The patient had a P pulmonale, other that normal sinus rhythm.   ASSESSMENT AND PLAN: A 59 year old female with history of chronic obstructive pulmonary disease and hypertension, comes with increased shortness of breath.  1.  Chronic obstructive pulmonary disease exacerbation. At this moment, the patient is overall stable. She had some improvement after nebulizers and after  Solu-Medrol given by the paramedics. The patient is going to be admitted as an observation, continue advair , Spiriva. Continue nebulizers around the clock, pulmonary toilet as well as steroids. We are going to start her with oral steroids, prednisone 60 mg decreasing 10 mg every day until gone, and start her on azithromycin to help the inflammatory process on the lung. The patient is overall stable, titrate oxygen down. No signs of pneumonia.  2.  Hypokalemia. Replace with p.o. potassium.  3.  Hypertension. Continue Norvasc, benazepril and hydrochlorothiazide.  4.  Deep vein thrombosis prophylaxis. Heparin.  5.  Gastrointestinal prophylaxis. Protonix.  6.  The patient had some tightness of the chest with some pain in the rib cage. EKG is overall not indicative of ischemic changes and her first troponin is negative. P.r.n. troponin, p.r.n. EKG if the pain persists.  7.  CODE STATUS:  Full code. 8. [employer]obb[employer] abuse; smoking sessation councelling for 4 minutes  nicotine patches    TIME SPENT: I spent about 45 minutes with this patient.    ____________________________ Felipa Furnaceoberto Sanchez Gutierrez, MD rsg:cs D: 05/30/2014 13:27:00 ET T: 05/30/2014 14:23:16 ET JOB#: [id number]  cc: Felipa Furnaceoberto Sanchez Gutierrez, MD, <Dictator> Joseluis Alessio Juanda ChanceSANCHEZ GUTIERRE MD ELECTRONICALLY SIGNED 05/30/2014 18:27

## 2015-04-16 NOTE — Discharge Summary (Signed)
PATIENT NAME:  Ashley [name], Shelsie E MR#:  [id number] DATE OF BIRTH:  May 17, 1956  DATE OF ADMISSION:  05/30/2014 DATE OF DISCHARGE:  05/31/2014  DISCHARGE DIAGNOSES:  1. Chronic obstructive pulmonary disease exacerbation.  2. Hypertension.  3. Tobacco abuse.   DISCHARGE MEDICATIONS:  1. DuoNeb 3 mL every 4 hours as needed for shortness of breath or wheezing.  2. Advair Diskus 250/50 one puff inhaled 2 times a day.  3. Spiriva 18 mcg inhaled daily.  4. Hydrochlorothiazide 10/12.5 one tablet oral once a day.   6. Proventil HFA 2 puffs inhaled 4 times a day as needed.  7. Prednisone 60 mg tapered over 6 days.  8. Erythromycin 5 mg oral once a day for 3 days.   DISCHARGE INSTRUCTIONS: Low-sodium, regular consistency diet. Activity as tolerated. Follow up with primary care physician in 1 or 2 weeks and quit smoking.   ADMITTING HISTORY AND PHYSICAL AND HOSPITAL COURSE: Please see the detailed H and P dictated by Dr. Mordecai MaesSanchez. In brief, a 59 year old female patient with history of COPD who has done well over the past year after quitting smoking returns to the Emergency Room with complaints of worsening shortness of breath. The patient was wheezing.  Did not improve completely in the emergency room with IV steroids and multiple nebulizer treatment. Admitted to the hospitalist service. By the day of discharge, the patient does not have any wheezing, has good air entry, feels back to baseline, and is being discharged home.  Her flare-up of chronic obstructive pulmonary disease was secondary to restarting her smoking for which the patient has been counseled.   TIME SPENT ON DISCHARGE: On discharge day, was 45 minutes.    ____________________________ Molinda BailiffSrikar R. Leilani Cespedes, MD srs:dd D: 06/01/2014 15:34:52 ET T: 06/01/2014 19:51:25 ET JOB#: [id number]  cc: Wardell HeathSrikar R. Dalonte Hardage, MD, <Dictator> Orie FishermanSRIKAR R Lenora Gomes MD ELECTRONICALLY SIGNED 06/03/2014 16:39

## 2015-04-16 NOTE — H&P (Signed)
PATIENT NAME:  Ashley [name], Ashley [name] MR#:  [id number] DATE OF BIRTH:  August 30, 1956  DATE OF ADMISSION:  09/05/2014  REFERRING PHYSICIAN:  Lurena Joinerebecca L. Lord, MD  PR[facility]   CHIEF COMPLAINT:  Short of breath.   HISTORY OF PRESENT ILLNESS:  A 59 year old Caucasian female with past medical history of COPD, non-O2-dependent, as well as hypertension, presenting with shortness of breath. She describes shortness of breath worsening over the last 1-day duration with associated nonproductive cough. She also describes some URI-like symptoms consistent with nasal congestion, as well as positive sick contacts with her husband having similar symptoms for the last 2-3 days. Upon arrival to the Emergency Department she was unable to speak in full sentences secondary to respiratory distress. Her status has improved thus far after receiving multiple breathing treatments.   REVIEW OF SYSTEMS:  CONSTITUTIONAL:  Denies fever. Positive for fatigue, weakness.  EYES:  Denies blurred vision, double vision, or eye pain.  EARS, NOSE, THROAT:  Denies tinnitus, ear pain, hearing loss.  RESPIRATORY:  Positive for cough, shortness of breath, wheezing as described above.  CARDIOVASCULAR:  Denies chest pain, palpitations, edema.  GASTROINTESTINAL:  Denies nausea, vomiting, diarrhea, or abdominal pain.  GENITOURINARY:  Denies dysuria or hematuria.  ENDOCRINE:  Denies nocturia or thyroid problems. HEMATOLOGY AND LYMPHATIC:  Denies easy bruising or bleeding.  SKIN:  Denies rashes or lesions.  MUSCULOSKELETAL:  Denies pain in neck, back, shoulder, knees, hips, or arthritic symptoms.  NEUROLOGIC:  Denies paralysis, paresthesias.  PSYCHIATRIC:  Denies anxiety or depressive symptoms.  Otherwise, full review of systems performed is negative.   PAST MEDICAL HISTORY:  COPD, non-O2-dependence, hypertension.   SOCIAL HISTORY:  Remote tobacco use. Denies any alcohol or drug use.   FAMILY HISTORY:  Positive  for coronary artery disease.   ALLERGIES:  No known drug allergies.   HOME MEDICATIONS:  Include benazepril/hydrochlorothiazide 10/12.5 mg p.o. daily, Advair 250/50 mcg inhalation 1 puff b.i.d., DuoNeb treatments 3 mL up to 4 times daily as needed for shortness of breath, Proventil 90 mcg inhalation 2 puffs 4 times daily for shortness of breath, Spiriva 18 mcg inhalation once daily, Norvasc 5 mg p.o. daily.   PHYSICAL EXAMINATION:  VITAL SIGNS:  Temperature 97.9, heart rate 91, respirations 24, blood pressure 164/74, saturating 96% on supplemental O2. Weight 36.3 kg, BMI 14.7.  GENERAL:  Chronically ill-appearing Caucasian female currently in minimal distress given respiratory status.  HEAD:  Normocephalic, atraumatic.  EYES:  Pupils equal, round, reactive to light. Extraocular muscles intact. No scleral icterus.  MOUTH:  Moist mucosal membranes. Dentition intact. No abscess noted.  EARS, NOSE, THROAT:  Clear without exudates. No external lesions.  NECK:  Supple. No thyromegaly. No nodules. No JVD.  PULMONARY:  Grossly diminished breath sounds in all lung fields; however, no wheezing, rales, or rhonchi at this time. She has tachypneic without use of accessory muscles. Poor respiratory effort.  CHEST:  Nontender to palpation.  CARDIOVASCULAR:  S1, S2, regular rate and rhythm. No murmurs, rubs, or gallops. No edema. Pedal pulses 2+ bilaterally.  GASTROINTESTINAL:  Soft, nontender, nondistended. No masses. Positive bowel sounds. No hepatosplenomegaly.  MUSCULOSKELETAL:  No swelling, clubbing, or edema. Range of motion full in all extremities.  NEUROLOGIC:  Cranial nerves II to XII intact. No gross focal neurological deficits. Sensation intact. Reflexes intact.  SKIN:  No ulcerations, lesions, rashes, cyanosis. Skin warm, dry. Turgor intact.  PSYCHIATRIC:  Mood and affect within normal limits. She is  awake, alert, oriented  x 3. Insight and judgment intact.   LABORATORY DATA:  Chest x-ray  performed reveals severe emphysema without acute cardiopulmonary process. Remainder of laboratory data: Sodium 146, potassium 3.6, chloride 109, bicarbonate 29, BUN 14, creatinine 0.59, glucose 95. WBC 7.6, hemoglobin 13.8, platelets 188,000.   ASSESSMENT AND PLAN:  A 59 year old Caucasian female with history of chronic obstructive pulmonary disease, non-oxygen-dependent, presenting with shortness of breath. 1.  Chronic obstructive pulmonary disease exacerbation: Provide supplemental oxygen to keep oxygen saturation greater than 92%, DuoNeb treatments q. 4 hours, incentive spirometry, Solu-Medrol 60 mg intravenous daily, azithromycin daily, as well as continuing her Advair and Spiriva.  2.  Hypertension: Continue benazepril/hydrochlorothiazide and Norvasc.  3.  Venous thromboembolism prophylaxis with heparin subcutaneously.   CODE STATUS:  The patient is full code.  TIME SPENT: 45 minutes.    ____________________________ Cletis Athens. Aarthi Uyeno, MD dkh:ts D: 09/05/2014 22:06:57 ET T: 09/05/2014 22:52:12 ET JOB#: [id number]  cc: Cletis Athens. Sarah Baez, MD, <Dictator> Rolla Servidio Synetta Shadow MD ELECTRONICALLY SIGNED 09/06/2014 20:50

## 2015-04-17 NOTE — H&P (Signed)
PATIENT NAME:  Ashley [name], Ashley [name] MR#:  [id number] DATE OF BIRTH:  [birth year]  DATE OF ADMISSION:  06/24/2012  CHIEF COMPLAINT: Shortness of breath and wheezing for one day.   HISTORY OF PRESENT ILLNESS: The patient is a 59 year old Caucasian female with a history of chronic obstructive pulmonary disease, hypertension, and tobacco use who presented to the ED with shortness of breath and wheezing since yesterday. The patient was just discharged for chronic obstructive pulmonary disease exacerbation two weeks ago. She was treated with  steroid and nebulizer Advair and was discharged to home with prednisone. However, one week after discharge the patient felt drowsy and weak, and yesterday the patient developed shortness of breath, wheezing, and cough so she came to the ED for further evaluation. Her oxygen saturation decreased to 80% so she was admitted for chronic obstructive pulmonary disease exacerbation and treated with nebulizers.   PAST MEDICAL HISTORY:  1. Chronic obstructive pulmonary disease. 2. Hypertension. 3. Tobacco use.   PAST SURGICAL HISTORY: Hysterectomy.   MEDICATIONS: 1. Albuterol metered dose inhaler p.r.n.  2. Advair 250/50 1 puff b.i.d. 3. Norvasc 5 mg p.o. daily.   ALLERGIES: None.   SOCIAL HISTORY: The patient said she quit smoking four weeks ago. Denies any alcohol drinking or illicit drugs.   FAMILY HISTORY: Mother had heart disease. Father died of myocardial infarction. Brother has cancer.   REVIEW OF SYSTEMS: CONSTITUTIONAL: The patient denies any fever or chills. No headache or dizziness, but has weakness. EYES: No double vision or blurred vision. ENT: No postnasal drip, epistaxis, slurred speech, or dysphagia. PULMONARY: Positive for cough, sputum, wheezing, and shortness of breath, but no hemoptysis. CARDIOVASCULAR: No chest pain, palpitation, orthopnea, or nocturnal dyspnea. No leg edema. GI: No abdominal pain, nausea, vomiting, or diarrhea. GU: No dysuria or  hematuria. ENDOCRINE: No polyuria or polydipsia. HEMATOLOGY: No easy bruising or bleeding. SKIN: No rash or jaundice. NEUROLOGIC: No syncope, loss of consciousness, or seizure. PSYCHIATRIC: Positive for some anxiety.   PHYSICAL EXAMINATION:  VITALS: Temperature 99, blood pressure 124/65, pulse 106, respirations 30, oxygen saturation 100% on nasal cannula oxygen.   GENERAL: The patient is alert, awake, and oriented, in no distress.   HEENT: Pupils are round, equal, reactive to light and accommodation.   NECK: Supple. No JVD or carotid bruit. No lymphadenopathy. No thyromegaly.   PULMONARY: Bilateral air entry. No wheezing, but very weak breath sounds.  No crackles. No rales.   ABDOMEN: Soft. No distention or tenderness. No organomegaly. Bowel sounds present.   EXTREMITIES: No edema, clubbing, or cyanosis. No calf tenderness. Strong bilateral pedal pulses.   SKIN: No rash or jaundice.   NEUROLOGY: Alert and oriented times three. No focal deficits. Power 5/5. Sensation intact. Deep tendon reflexes 2+.   LABORATORY DATA:  Glucose 94, BUN 14, creatinine 0.73. Electrolytes normal. Chest x-ray: Marked hyperinflation. No evidence of pneumonia or congestive heart failure. EKG shows sinus tachycardia at 105 beats per minute with biatrial enlargement.   IMPRESSION:  1. Chronic obstructive pulmonary disease exacerbation.  2. Hypertension, controlled.  3. Tobacco abuse.  4.   Anxiety.  PLAN OF TREATMENT: The patient will be admitted to the medical floor. We will start Solu-Medrol, DuoNeb, and Zithromax, and continue Norvasc. Smoking cessation was counseled.   TIME SPENT: About 55 minutes. Discussed the patient's situation and plan of treatment with the patient and the patient's husband.   ____________________________ Shaune Pollack, MD qc:bjt D: 06/24/2012 13:23:02 ET T: 06/24/2012 14:40:18 ET JOB#: [id number]  cc: Shaune Pollack,  MD, <Dictator> Shaune PollackQING Azul Brumett MD ELECTRONICALLY SIGNED 06/24/2012 15:00

## 2015-04-17 NOTE — H&P (Signed)
PATIENT NAME:  Ashley [name], Ashley [name] MR#:  [id number] DATE OF BIRTH:  26-Oct-1956  DATE OF ADMISSION:  06/06/2012  PRIMARY CARE PHYSICIAN: [facility]   CHIEF COMPLAINT: Shortness of breath today.   HISTORY OF PRESENT ILLNESS: Ashley [name] is a 59 year old Caucasian female who was just discharged May 23rd with similar symptoms of COPD exacerbation who comes to the Emergency Room after she started experiencing increasing shortness of breath and chest tightness today along with feeling very shaky after she took nebulizer treatment here in the Emergency Room. She was found to have PaO2 of 53 on ABG. Currently sats are 93%. She felt shaky after the frequent nebulizer treatment. She received a dose of IV Solu-Medrol. She is being admitted for COPD exacerbation. Denies any fever or any productive cough. Chest x-ray in the ER is negative for pneumonia.   PAST MEDICAL HISTORY:  1. Chronic obstructive pulmonary disease.  2. Tobacco abuse. The patient reports quitting about two weeks ago.  3. Hypertension.   PAST SURGICAL HISTORY: Partial hysterectomy.   MEDICATIONS:  1. Albuterol metered-dose inhaler p.r.n.  2. Advair 250/50 1 puff b.i.d.  3. Norvasc 5 mg p.o. daily.    SOCIAL HISTORY: She lives at home. Reports smoking cessation about two weeks ago. Denies any alcohol or drug use.    FAMILY HISTORY: Mother had heart disease. Father died of MI. Brother had lung cancer.   REVIEW OF SYSTEMS: CONSTITUTIONAL: No fever, fatigue, weakness. EYES: No blurred or double vision. No glaucoma. ENT: No tinnitus, ear pain, hearing loss. RESPIRATORY: Positive for shortness of breath, wheeze, and COPD. CARDIOVASCULAR: No chest pain, orthopnea, edema. Positive for hypertension. GI: No nausea, vomiting, diarrhea, or abdominal pain. GU: No dysuria or hematuria. ENDOCRINE: No polyuria or nocturia. HEMATOLOGY: No anemia or easy bruising. SKIN: No acne or rash. MUSCULOSKELETAL: No arthritis. NEUROLOGIC: No CVA or TIA. PSYCH:  Positive for some anxiety. No depression. All other systems reviewed and negative.   LABORATORY, DIAGNOSTIC, AND RADIOLOGICAL DATA: Chest x-ray COPD without evidence of any pneumonia.   pH 7.41, pCO2 44, pO2 53 on room air with oxygen saturation of 87.4. Glucose 122, BUN 13, creatinine 0.45. Rest of the electrolytes normal. Bilirubin 1.2. Platelet count 146. Rest of the hemogram is normal. Troponin is 0.02.   EKG shows normal sinus rhythm with biatrial enlargement.   PHYSICAL EXAMINATION:   GENERAL: The patient is awake, alert, and oriented x3, mild to moderate distress, mildly anxious. She is thin, cachectic.   VITAL SIGNS: Afebrile, pulse 80, blood pressure 146/67, sats are 93% on room air.   HEENT: Atraumatic, normocephalic. Pupils equal, round, and reactive to light and accommodation. Extraocular movements intact. Oral mucosa is moist.   NECK: Supple. No JVD. No carotid bruits.   RESPIRATORY: Distant breath sounds. No wheezing, acute respiratory distress, or use of accessory muscles. No crackles heard.   CARDIOVASCULAR: Both the heart sounds are normal. Rate, rhythm is regular. PMI not lateralized. Chest nontender. Good femoral pulses. No lower extremity edema.   ABDOMEN: Soft, benign, nontender. No organomegaly. Positive bowel sounds.   NEUROLOGIC: Grossly intact cranial nerves II through XII. No motor or sensory deficits.   PSYCH: The patient is awake, alert, and oriented x3. She has some anxiety present.   ASSESSMENT: 59 year old Ashley [name] with:  1. Acute COPD exacerbation with hypoxia. Will admit patient to regular floor. FULL CODE. Will continue IV Solu-Medrol, nebulizer treatment along with inhalers. Chest x-ray no pneumonia. The patient is otherwise clinically stable. Hold off  on antibiotics. Currently sats are 93% on room air.  2. Hypertension. Continue Norvasc. Appears to be controlled well.  3. Mild hyperglycemia, appears reactive likely due to steroids. Will continue to  monitor.  4. Tobacco abuse. The patient reports she is not smoking for the past 10 to 12 days. She is also asked to continue cessation and not to go back smoking again after discharge. The patient is agreeable to it. About three minutes spent.  5. Further work-up according to the patient's clinical course.   Hospital admission plan was discussed with patient. No family members present.   TIME SPENT: 50 minutes.   ____________________________ Wylie HailSona A. Allena KatzPatel, MD sap:drc D: 06/06/2012 11:42:03 ET T: 06/06/2012 12:00:40 ET JOB#: [id number]  cc: Celinda Dethlefs A. Allena KatzPatel, MD, <Dictator> [facility] Willow OraSONA A Rayman Petrosian MD ELECTRONICALLY SIGNED 06/14/2012 15:43

## 2015-04-17 NOTE — Discharge Summary (Signed)
PATIENT NAME:  Ashley [name], Ashley [name] MR#:  [id number] DATE OF BIRTH:  1956/10/31  DATE OF ADMISSION:  06/24/2012 DATE OF DISCHARGE:  06/27/2012  PRIMARY CARE PHYSICIAN: [facility]   REASON FOR ADMISSION: Shortness of breath, cough and wheezing.   DISCHARGE DIAGNOSES:  1. Acute chronic obstructive pulmonary disease exacerbation.  2. Acute bronchitis.  3. History of hypertension.  4. Anxiety.   CONSULTATIONS: None.   DISCHARGE DISPOSITION: Home.   DISCHARGE MEDICATIONS:  1. Prednisone taper as written on prescription. 2. Omnicef 300 mg p.o. every 12 hours x3 days.  3. Azithromycin 250 mg p.o. daily x1 day.  4. Spiriva HandiHaler one cap inhaled daily.  5. Albuterol metered dose inhaler 1 to 2 puffs inhaled every 4 to 6 hours p.r.n. shortness of breath or wheezing.  6. DuoNebs one dose inhaled every 6 hours p.r.n. shortness of breath or wheezing.  7. Xanax 0.25 mg p.o. every 8 hours p.r.n. anxiety.  8. Tylenol 325 mg, 1 to 2 tablets p.o. every 6 hours p.r.n. pain.  9. Amlodipine 5 mg daily.  10. Advair Diskus 250/50, 1 dose inhaled b.i.d.   DISCHARGE CONDITION: Improved, stable, with improvement of shortness of breath and resolution of cough and wheezing.   DISCHARGE ACTIVITY: As tolerated.   DISCHARGE DIET: Low sodium, low fat, low cholesterol.   DISCHARGE INSTRUCTIONS:  1. Take medications as prescribed.  2. Return to Emergency Department for worsening shortness of breath or for recurrence of wheezing or cough or any development of any fever or chills.   FOLLOW-UP INSTRUCTIONS:   Follow-up with the [facility] within 1 to 2 weeks.   REFERRALS: The patient is being referred to [facility] within 2 to 3 weeks for chronic obstructive pulmonary disease.   PERTINENT LABORATORY, DIAGNOSTIC AND RADIOLOGICAL DATA:  Chest x-ray, PA and lateral, 06/24/2012: Marked hyperinflation. No evidence of pneumonia or congestive heart failure and otherwise no acute cardiopulmonary  abnormalities are noted.  BMP normal on admission.  CBC normal on admission.   BRIEF HISTORY/HOSPITAL COURSE: The patient is a very pleasant 59 year old female with past medical history of former tobacco abuse, chronic obstructive pulmonary disease, hypertension and anxiety, presented to the Emergency Department  with complaints of shortness of breath, cough and wheezing. Please see dictated admission History and Physical for pertinent details surrounding the onset of this hospitalization. Please see below for further details.   1. Acute chronic obstructive pulmonary disease exacerbation: This was due to acute bronchitis, and chest x-ray in the ER did not reveal any evidence of pneumonia or any acute cardiopulmonary abnormalities suggestive of hyperinflated lungs. She had significant shortness of breath and wheezing as well as cough at the time of admission which was nonproductive. She was initially placed on supplemental oxygen via nasal cannula, started on IV steroids and IV antibiotic therapy, and was given bronchodilator support and has responded extremely well to these measures with significant improvement of her shortness of breath and eventual resolution of her cough and wheezing. Once her condition was noted to have improved, she was transitioned off IV steroids onto oral prednisone taper which she will complete as an outpatient. Her antibiotics are transitioned from IV to oral, she is to continue antibiotics at the time of discharge as mentioned under discharge medications. She will also continue Advair and was also started on Spiriva. She will be referred to [facility] at the time of discharge for ongoing chronic obstructive pulmonary disease management.   2. Hypertension: Blood pressures remained well  controlled during this hospital on Norvasc therapy, and she will continue this as an outpatient.  3. Anxiety: The patient has responded well to p.r.n. Xanax for which she will be given a  prescription at the time of discharge to be taken on a p.r.n. basis, and she is currently not anxious at the time of discharge.   On the day of discharge on 06/27/2012, she reports that her shortness of breath has significantly improved, and she has ambulated in the hallways without worsening of her shortness of breath, and she is without any cough or wheezing and is otherwise hemodynamically stable. On 06/27/2012, the patient when I rounded on the patient she was hemodynamically and medically stable and felt to be stable for discharge home with close outpatient follow-up, to which the patient was agreeable.   DISCHARGE PHYSICAL EXAMINATION: VITAL SIGNS: Temperature 98, pulse 64, blood pressure 111/69, respirations 18, and oxygen saturation 97% on room air. The patient also ambulated in the hallways on room air, and oxygen saturations have persistently remained above 90%. She did not significantly desaturate with ambulation. GENERAL: The patient is alert and oriented, not acutely distressed, not in any respiratory distress. HEENT: Normocephalic, atraumatic. Pupils are equal, round and reactive to light and accommodation.. Extraocular movements are intact. Anicteric sclerae. Conjunctivae pink. Hearing intact to voice. Nares without drainage. Oral mucosa moist without lesions. NECK: Supple with full range of motion. No jugular venous distention, lymphadenopathy, or carotid bruits bilaterally. No thyromegaly or tenderness to palpation over the thyroid gland. CHEST: She has decreased breath sounds bilaterally with hyperresonance to percussion, but overall there has been significantly improved aeration of both lungs bilaterally since the time of admission, and she has got no wheezing, rhonchi, crackles or rales. She has normal respiratory effort without use of accessory respiratory muscles. CARDIOVASCULAR: S1, S2 positive but distant. Regular rate and rhythm. No murmurs, rubs, or gallops. PMI is nonlateralized.  ABDOMEN: Soft, nontender, nondistended. Normoactive bowel sounds. No hepatosplenomegaly or palpable masses. No hernias. EXTREMITIES: No clubbing, cyanosis, or edema. Pedal pulses are palpable bilaterally. SKIN: No suspicious rashes. Skin turgor is good. LYMPH: No cervical lymphadenopathy. NEUROLOGIC: Alert, and oriented x3. Cranial nerves II through XII are grossly intact. No  focal deficits. PSYCHIATRIC: A pleasant female with an appropriate affect.        TIME SPENT ON DISCHARGE: Greater than 30 minutes.  ____________________________ Elon AlasKamran N. Keiva Dina, MD knl:cbb D: 06/27/2012 11:06:07 ET T: 06/28/2012 15:11:13 ET JOB#: [id number]  cc: Elon AlasKamran N. Mandi Mattioli, MD, <Dictator> [facility] Elon AlasKAMRAN N Anuradha Chabot MD ELECTRONICALLY SIGNED 07/04/2012 17:35

## 2015-04-17 NOTE — H&P (Signed)
PATIENT NAME:  Ashley [name], Ashley [name] MR#:  [id number] DATE OF BIRTH:  [birth year]  DATE OF ADMISSION:  05/13/2012  REFERRING PHYSICIAN: ER physician, Dr. Mindi Junker PRIMARY CARE PHYSICIAN: [facility]  CHIEF COMPLAINT: Sinus congestion, drainage, shortness of breath.    HISTORY OF PRESENT ILLNESS: Patient is a 59 year old female with past medical history of chronic obstructive pulmonary disease, bronchitis, ongoing smoking who was last admitted to [facility] in December 2012 for an acute chronic obstructive pulmonary disease exacerbation. Patient reports that she was doing well since her last admission. About four days ago patient felt like she was having a cold. She developed sinus drainage, stuffiness in her nose and itching in her throat. She has been coughing up clear phlegm. She did not take her temperature but felt hot and sweaty. In the ED she was afebrile. Since last night she became severely short of breath. When she came in she was in some respiratory distress and was wheezing and received several respiratory treatments. Patient denies any other symptoms including lightheadedness, dizziness, chest pain, nausea, vomiting, diarrhea, constipation.   PAST MEDICAL HISTORY:  1. Chronic obstructive pulmonary disease. 2. Ongoing smoking.   PAST SURGICAL HISTORY: Partial hysterectomy.   MEDICATIONS:  1. Albuterol metered dose inhaler p.r.n.  2. Advair 250/50, 1 puff b.i.d.  3. DuoNebs p.r.n.   SOCIAL HISTORY: Patient continues to smoke. She says she smokes a couple of cigarettes very single day. Denies any alcohol or drug abuse. She is married and lives with her husband.   FAMILY HISTORY: Mother had heart disease. Father died of an myocardial infarction at age of 63. Brother had lung cancer.   REVIEW OF SYSTEMS: CONSTITUTIONAL: Denies any fever. Reports fatigue, weakness. EYES: Denies any blurred or double vision. ENT: Denies any tinnitus, ear pain. RESPIRATORY: Reports cough, shortness of breath.  CARDIOVASCULAR: Denies any chest pain, syncope. GASTROINTESTINAL: Denies any nausea, vomiting, diarrhea abdominal pain. GENITOURINARY: Denies any dysuria, hematuria. ENDO: Denies any polyuria or nocturia. HEME/LYMPH: Denies any anemia, easy bruisability. INTEGUMENT: Denies any acne, rash. MUSCULOSKELETAL: Denies any swelling, gout. NEUROLOGICAL: Denies any numbness, weakness. PSYCH: Denies any anxiety, depression.   PHYSICAL EXAMINATION:  VITAL SIGNS: Temperature 97.7, heart rate 99, respiratory rate 20, blood pressure 182/126, pulse ox 94% on room air.   GENERAL: Patient is a thin built Caucasian female laying in bed, not in acute distress.   HEAD: Atraumatic, normocephalic.   EYES: There is no pallor, icterus, or cyanosis. Pupils equal, round, and reactive to light and accommodation. Extraocular movements intact.   ENT: Wet mucous membranes. No oropharyngeal erythema or thrush.   NECK: Supple. No masses. No JVD. No thyromegaly or lymphadenopathy.   CHEST WALL: No tenderness to palpation. Not using accessory muscles of respiration. No intracostal muscle retractions.   LUNGS: Bilaterally reduced breath sounds, some scattered wheezing. No rhonchi or crepitus.   CARDIOVASCULAR: S1, S2 regular. No murmur, rubs, or gallops.  ABDOMEN: Soft, nontender, nondistended. No guarding. No rigidity. No organomegaly.   SKIN: No rashes or lesions.   PERIPHERIES: No pedal edema. 2+ pedal pulses.   MUSCULOSKELETAL: No cyanosis, clubbing.   NEUROLOGICAL: Awake, alert, oriented x3. Nonfocal neurological exam. Cranial nerves grossly intact.   PSYCH: Normal mood and affect.   LABORATORY, DIAGNOSTIC, AND RADIOLOGICAL DATA: CK, troponin normal. White count 7.4, hemoglobin 14.5, hematocrit 43.5, platelets 125, glucose 106, BUN 13, creatinine 0.63, sodium 144, potassium 3.2, chloride 106, CO2 29, calcium 8.9, bilirubin 0.4, alkaline phosphatase 137, ALT 13, AST 18.   ASSESSMENT AND  PLAN: 59 year old female  with past medical history of chronic obstructive pulmonary disease presents with shortness of breath, sinus symptoms.  1. Acute chronic obstructive pulmonary disease exacerbation and possible bronchitis, possibly induced by recent viral illness. Patient was in some respiratory distress and wheezing when she initially presented. Will admit her to the hospital and start her on Advair, IV Solu-Medrol, IV antibiotics and respiratory treatments. Will provide with oxygen supplementation.  2. Accelerated hypertension. [name] blood pressure was 182/126 on presentation possibly due to acute distress. Currently down to 155/77. Will start patient on low dose Norvasc and continue to monitor.  3. Hyperglycemia, possibly reactive. Patient has no history of diabetes.  4. Hypokalemia. Will replace p.o.  5. History of smoking. Patient was counseled about cessation for more than three minutes. Will provide with a nicotine patch.   TIME SPENT: 75 minutes.   ____________________________ Darrick MeigsSangeeta Jaylyn Booher, MD sp:cms D: 05/13/2012 09:53:51 ET T: 05/13/2012 10:10:42 ET JOB#: [id number]  cc: Darrick MeigsSangeeta Jeselle Hiser, MD, <Dictator> [facility] Darrick MeigsSANGEETA Infant Zink MD ELECTRONICALLY SIGNED 05/14/2012 15:42

## 2015-04-17 NOTE — Discharge Summary (Signed)
PATIENT NAME:  Ashley [name], Ashley [name] MR#:  [id number] DATE OF BIRTH:  04/20/1956  DATE OF ADMISSION:  06/06/2012 DATE OF DISCHARGE:  06/07/2012  PRESENTING COMPLAINT: Shortness of breath.   DISCHARGE DIAGNOSIS: Acute chronic obstructive pulmonary disease exacerbation.   CONDITION ON DISCHARGE: Fair. Sats are 95% on room air.   MEDICATIONS: 1. Norvasc 5 mg p.o. daily.  2. Advair 250/50, 1 puff b.i.d.  3. DuoNebs 3 mL every six hours as needed.  4. Prednisone taper.   FOLLOW UP: Follow up with [facility] in 1 to 2 weeks.   LABORATORY, DIAGNOSTIC AND RADIOLOGICAL DATA: Blood culture negative in 48 hours. pO2 on ABG was 53. Chest x-ray consistent with chronic obstructive pulmonary disease. Cardiac enzymes negative. Comprehensive metabolic panel within normal limits except bilirubin of 1.2.   Platelet count 146. Rest of CBC is normal.   BRIEF SUMMARY OF HOSPITAL COURSE: [name] is a 59 year old Caucasian female with history of chronic obstructive pulmonary disease comes in with increasing shortness of breath. She was admitted with:  1. Acute chronic obstructive pulmonary disease exacerbation with hypoxemia. She was started on IV Solu-Medrol around-the-clock, nebulizer treatment along with inhalers. Chest x-ray did not show pneumonia. Antibiotics were not initiated since patient was afebrile, white count was normal, patient was not having any cough. Sats were 93% to 95% on room air. [name] steroids were changed to p.o. and will taper off in the next few days as outpatient.  2. Hypertension. Norvasc was continued, appeared well controlled.  3. Mild hyperglycemia, appeared reactive likely due to steroids.  4. Tobacco abuse. Patient quit about two weeks ago. She was advised to continue cessation.  5. Hospital stay otherwise remained stable. CODE STATUS: Patient remained a FULL CODE.   TIME SPENT: 40 minutes.   ____________________________ Wylie HailSona A. Allena KatzPatel, MD sap:cms D: 06/08/2012 06:56:33  ET T: 06/09/2012 10:14:29 ET JOB#: [id number]  cc: Charlye Spare A. Allena KatzPatel, MD, <Dictator> [facility] Willow OraSONA A Morgin Halls MD ELECTRONICALLY SIGNED 06/14/2012 15:43

## 2015-04-17 NOTE — Discharge Summary (Signed)
PATIENT NAME:  Ashley [name], Ashley [name] MR#:  [id number] DATE OF BIRTH:  1956/07/27  DATE OF ADMISSION:  05/13/2012 DATE OF DISCHARGE:  05/15/2012  DISCHARGE DIAGNOSES: 1. Acute chronic obstructive pulmonary disease exacerbation. 2. Acute bronchitis. 3. Accelerated hypertension.  4. Hyperglycemia.  5. Hypokalemia. 6. Tobacco abuse.   DISPOSITION: The patient is being discharged home. Follow-up with PCP at the [facility] in 1 to 2 weeks after discharge.   DIET: Low sodium.   ACTIVITY: As tolerated.   DISCHARGE MEDICATIONS:  1. Norvasc 5 mg daily.  2. Levaquin 500 mg daily for five days. 3. Prednisone taper as prescribed. 4. Albuterol metered dose inhaler p.r.n.  5. Advair 250/50 one puff twice a day. 6. DuoNebs p.r.n.   RESULTS: Blood cultures no growth so far.   Chest x-ray showed no evidence of infiltrative. There was chronic obstructive pulmonary disease with fibrosis. CBC normal other than platelet count of 125. Hyperglycemia with glucose 106, likely steroid induced. Potassium 3.2, replaced. Cardiac enzymes negative. ALT 137. The rest of the CMP was normal.   HOSPITAL COURSE: The patient is a 59 year old female with past medical history of chronic obstructive pulmonary disease and ongoing smoking who presented with symptoms of shortness of breath and sinus drainage. She was found to have acute chronic obstructive pulmonary disease exacerbation and bronchitis possibly induced by recent sinus infection/viral illness. She was admitted to the hospital and started on nebulizer treatments, Advair, IV antibiotics and steroids with good response. She is being switched over to oral antibiotics and steroid taper by the time of discharge. The patient had accelerated hypertension on admission. She was started on low dose Norvasc. Her blood pressure is well controlled currently. The patient was found to be hyperglycemic, likely due to stress/steroids. She has no history of diabetes. She had mild  hypokalemia which was supplemented. She has been extensively counseled about smoking cessation. She will discharged home in stable condition.   TIME SPENT: 45 minutes.  ____________________________ Darrick MeigsSangeeta Carmichael Burdette, MD sp:slb D: 05/15/2012 13:00:28 ET T: 05/16/2012 11:55:53 ET JOB#: [id number]  cc: Darrick MeigsSangeeta Merinda Victorino, MD, <Dictator> [facility] Darrick MeigsSANGEETA Danamarie Minami MD ELECTRONICALLY SIGNED 05/16/2012 14:00

## 2015-04-24 NOTE — Discharge Summary (Signed)
PATIENT NAME:  Ashley [name], Ashley [name] MR#:  [id number] DATE OF BIRTH:  11/14/1956  DATE OF ADMISSION:  01/11/2015 DATE OF DISCHARGE:  01/13/2015  PRESENTING COMPLAINT: Shortness of breath.   DISCHARGE DIAGNOSES: 1.  Chronic obstructive pulmonary disease exacerbation.  2.  Protein calorie malnutrition, body mass index 14.9.  3.  Hypertension. 4.  Ongoing tobacco abuse.  CONSULTATIONS: Dr. Dema Severin, pulmonology.   PROCEDURES: Chest x-ray, January 19th: No active disease. Changes consistent with COPD.  Chest x-ray, January 20th: COPD. No acute findings.   HISTORY OF PRESENT ILLNESS: This very pleasant 59 year old female with history of known COPD and ongoing smoking presents for acute on chronic COPD exacerbation with acute respiratory failure. She had been feeling short of breath with coughing and clear sputum for several days. She was using her inhalers and nebulizers without benefit. On presentation to the Emergency Room, she was breathing 22 times per minute using accessory muscles and did not have improvement with nebulizer treatment.   HOSPITAL COURSE BY PROBLEM: 1.  COPD exacerbation: She was treated initially with IV Solu-Medrol, which was transitioned to p.o. prednisone within 24 hours of admission. Treated initially with IV Levaquin, which was transitioned to azithromycin. She was treated with albuterol ipratropium nebulizers during the admission. She did very well and recovered quickly. She has been saturating about 95% on room air for the 48 hours prior to discharge. She is being discharged on an increased dose of Advair, continued Spiriva, nebulizers and p.r.n. albuterol. She was followed by pulmonology inpatient. She will need to continue to follow with her pulmonologist at [facility] after she is discharged.  2.  Ongoing tobacco abuse: The patient was counseled multiple times during the admission regarding smoking cessation. She understands that she needs to do this for her health.  3.  Hypertension:  The patient was initially fairly hypertensive, but blood pressure was well controlled on her home medications of amlodipine and lisinopril/hydrochlorothiazide. No changes were made to her chronic regimen.  4.  Protein calorie malnutrition with a BMI of 14.9: I have recommended that the patient consume 1 to 2 bottles of Ensure daily. Her decreased body mass is likely also due to COPD. 5.  Weakness: The patient was seen by physical therapy during the hospitalization and does not have any further physical therapy needs.   DISCHARGE PHYSICAL EXAMINATION: VITAL SIGNS: Temperature 98.2, pulse 91, respirations 18, blood pressure 105/65, oxygenation 93% on room air.  GENERAL: No acute distress.  RESPIRATORY: Lungs clear to auscultation with good air movement. No respiratory distress.  CARDIOVASCULAR: Regular rate and rhythm. No murmurs, rubs, or gallops.  ABDOMEN: Soft, nontender, nondistended.  EXTREMITIES: No edema, clubbing or cyanosis. Peripheral pulses 2+.  PSYCHIATRIC: The patient alert and oriented x4 with good insight into her clinical conditions.   DIAGNOSTIC DATA: Sodium 140, potassium 3.7, chloride 104, bicarb 28, BUN 9, creatinine 0.66. Troponin is negative x4. White blood cells 2.7, hemoglobin 14.2, platelets 158,000, MCV 92.   DISCHARGE MEDICATIONS: 1.  Aspirin 81 mg 1 tablet daily.  2.  Proventil HFA CFC free 90 mcg/inhalation inhaler 2 puffs inhaled 4 times a day as needed for shortness of breath.  3.  DuoNeb 0.5 mg/2.5 mg/3 mL inhalation 3 mL inhaled 4 times a day as needed for shortness of breath.  4. Spiriva 18 mcg 1 capsule inhaled once a day.  5.  Amlodipine 5 mg 1 tablet once a day.  6.  Lisinopril 10 mg 1 tablet once a day.  7.  Hydrochlorothiazide 12.5  mg 1 tablet once a day.  8.  Prednisone 20 mg 2 tablets orally twice a day for the next 4 days.  9.  Fluticasone salmeterol 500 mcg/50 mcg inhalation powder 1 puff inhaled 2 times a day.  10.  Azithromycin 250 mg 1 tablet once a  day for the next 3 days.  11.  Nicotine patch 14 mg applied topically 1 patch once a day x14 days.  12.  Ensure 240 mL orally once a day for 30 days.   CONDITION ON DISCHARGE: Stable.   DISCHARGE INSTRUCTIONS: Diet: Regular. Dietary supplements: Ensure once a day. Activity: As tolerated.   DISPOSITION: The patient is being discharged to home with no further home health needs.   TIMEFRAME FOR FOLLOW-UP: Follow up within 1 to 2 weeks with your primary care physician. [facility]   TIME SPENT ON DISCHARGE: 40 minutes.  ____________________________ Ena Dawleyatherine P. Clent RidgesWalsh, MD cpw:sb D: 01/13/2015 14:38:50 ET T: 01/13/2015 15:18:33 ET JOB#: [id number]  cc: Santina Evansatherine P. Clent RidgesWalsh, MD, <Dictator> [facility] JourneyATHERINE P WALSH MD ELECTRONICALLY SIGNED 01/13/2015 18:47

## 2015-04-24 NOTE — Discharge Summary (Signed)
PATIENT NAME:  Ashley [name], Ashley [name] MR#:  [id number] DATE OF BIRTH:  Jun 28, 1956  DATE OF ADMISSION:  01/15/2015 DATE OF DISCHARGE:  01/16/2015  [facility]YS[facility].   FINAL DIAGNOSES:  1.  Acute respiratory failure, which resolved.  2.  Chronic obstructive pulmonary disease exacerbation.  3.  Hypertension.  4.  Tobacco abuse.  5.  Hypokalemia.   MEDICATIONS ON DISCHARGE: Include aspirin 81 mg daily, Proventil HFA 2 puffs 4 times a day as needed for shortness of breath, DuoNeb nebulizer solution 3 mL 4 times a day as needed for shortness of breath, Spiriva 1 inhalation daily, hydrochlorothiazide 12.5 mg daily, Advair Diskus 500/50 one puff twice a day, Ensure 240 mL daily, lisinopril 10 mg daily, prednisone 20 mg 2 tablets once a day for 4 days, nicotine patch 21 mg daily, doxycycline 100 mg 1 capsule twice a day for 8 more days, hydrocortisone topical 1% applied to face twice a day for 10 days. Stop taking amlodipine and Zithromax.   DIET: Low-sodium diet, regular consistency.   ACTIVITY: As tolerated.   HOSPITAL COURSE: The patient was admitted January 15, 2015, and discharged January 16, 2015. The patient was recently in the hospital for a COPD exacerbation and came back with shortness of breath and cough. She was admitted with COPD exacerbation, acute respiratory failure, initially placed on BiPAP.   LABORATORY AND RADIOLOGICAL DATA DURING THE HOSPITAL COURSE: Included troponin negative. INR 0.9. Magnesium 1.9. Lipase 90. Glucose 89, BUN 9, creatinine 0.63, sodium 143, potassium 3.5, chloride 103, CO2 of 33, calcium 9.1. Liver function tests: Alkaline phosphatase 120. Other liver function tests normal range. White blood cell count 8.0, H and H 16.2 and 48.2, platelet count of 192,000. Chest x-ray: Findings consistent with COPD, otherwise clear. Venous ABG showed a pH of 7.34, pCO2 of 74. Next 2 troponins were negative. Repeat hemoglobin 13.2. Repeat potassium 3.3.  Repeat creatinine 0.69.   Pulse oximetry on room air 95% after exercise.   ASSESSMENT AND PLAN:  1.  Acute respiratory failure. This had resolved. The patient initially required bilevel positive airway pressure and on nasal cannula, then off nasal cannula.  2.  Chronic obstructive pulmonary disease exacerbation. The patient was given intravenous Solu-Medrol here and a prednisone taper. She was given Levaquin here. The patient was recently sent home with Zithromax. I did switch this to doxycycline upon going home. The patient has all of her usual inhalers and nebulizer at home.  3.  Hypertension. We stopped the Norvasc here. Continue hydrochlorothiazide and lisinopril.  4.  Tobacco abuse. The patient was counseled 3 minutes smoking cessation counseling by me upon discharge.  5.  Hypokalemia. This was replaced upon discharge. If it continues to be low as outpatient can consider stopping the hydrochlorothiazide.   TIME SPENT ON DISCHARGE: 35 minutes.    ____________________________ Herschell Dimesichard J. Renae GlossWieting, [name] rjw:ts D: 01/16/2015 14:18:53 ET T: 01/16/2015 20:59:23 ET JOB#: [id number]  cc: Herschell Dimesichard J. Renae GlossWieting, [name], <Dictator> [facility] Ashley [name] ELECTRONICALLY SIGNED 01/21/2015 14:41

## 2015-04-24 NOTE — H&P (Signed)
PATIENT NAME:  Ashley [name], Ashley [name] MR#:  [id number] DATE OF BIRTH:  [birth year]  DATE OF ADMISSION:  01/11/2015  [facility]   REQUESTING PHYSICIAN: Sheryl L. Mindi JunkerGottlieb, MD   CHIEF COMPLAINT: Shortness of breath.   HISTORY OF PRESENT ILLNESS: The patient is a 59 year old female with a known history of COPD with ongoing smoking, hypertension, is being admitted for acute on chronic respiratory failure due to COPD exacerbation. The patient has been feeling short of breath associated with some coughing, producing clear phlegm since Friday. Was trying all her inhalers and nebulizer without any benefit until today, but now, she was at the point that she could not manage at home and decided to come to the Emergency Department via EMS. While in the ED, she was breathing 22 times per minute; using her accessory muscles of respiration, was given some nebulizer breathing treatment in the ED with some relief, and she is being admitted for further evaluation and management.   PAST MEDICAL HISTORY: 1. COPD.  2. Hypertension.   ALLERGIES: No known drug allergies.    SOCIAL HISTORY: She has been trying to quit now. She has been using nicotine patch on 7 mg per day in a gradual tapering dose. She was smoking 1/2 pack of cigarettes daily for a long time, but she has been having trouble with her labs and hoping that she can sustain off smoking. Occasional alcohol. No IV drugs of abuse. No drug use.   FAMILY HISTORY: Positive for coronary artery disease.   MEDICATIONS AT HOME: 1. Advair 250/50 one puff b.i.d.  2. Amlodipine  5 mg p.o. daily. 3. aspirin 81 mg p.o. daily.  4. DuoNeb inhalation 4 times a day as needed.  5. Hydrochlorothiazide 12.5 mg p.o. daily.  6. Lisinopril 10 mg p.o. daily.  7. Proventil 2 puffs inhaled 4 times a day as needed.  8. Spiriva once daily.   REVIEW OF SYSTEMS:  CONSTITUTIONAL: No fever, fatigue, weakness.  EYES: No blurred or double vision.  EARS, NOSE,  AND THROAT: No tinnitus or ear pain.  RESPIRATORY: Positive for cough with clear sputum, wheezing, also dyspnea, along with COPD.  CARDIOVASCULAR: No chest pain, orthopnea, edema.  GASTROINTESTINAL: No nausea, vomiting, diarrhea.  GENITOURINARY: No dysuria or hematuria.  ENDOCRINE: No polyuria or nocturia.  HEMATOLOGY: No anemia or easy bruising.  SKIN: No rash or lesion.  MUSCULOSKELETAL: No arthritis or muscle cramp.  NEUROLOGIC: No tingling, numbness, weakness.  PSYCHIATRIC: No history of anxiety or depression.   PHYSICAL EXAMINATION: VITAL SIGNS: Temperature 98.1, heart rate 89 per minute, respirations 22 per minute, blood pressure 159/115. She is saturating 93% on room air. GENERAL: The patient is a 59 year old female lying in bed, in acute respiratory distress.  EYES: Pupils equal, round, and reactive to light and accommodation. No scleral icterus. Extraocular muscles intact.  HEENT: Head atraumatic, normocephalic. Oropharynx and nasopharynx clear.  NECK: Supple. No jugular venous distention. No thyromegaly noted. LUNGS: Decreased breath sounds at the bases, wheezing throughout both lungs, using accessory muscles of respiration. She is tachypneic, has good respiratory effort at this time.  CARDIOVASCULAR: S1, S2 normal. No murmurs, rubs, or gallop.  ABDOMEN: Soft, nontender, nondistended. Bowel sounds present. No organomegaly or mass.  EXTREMITIES: No pedal edema, cyanosis, or clubbing.  NEUROLOGIC: Cranial nerves II through XII intact. Muscle strength is 5/5 in all extremities. Sensation intact.  PSYCHIATRIC: The patient is alert and oriented x3.  SKIN: No obvious rash, lesion, ulcers.  MUSCULOSKELETAL: No joint effusion  or tenderness.   LABORATORY PANEL: Normal BMP. Normal CBC. Normal 1st set of troponin.   Chest x-ray in the Emergency Department showed COPD. No other acute cardiopulmonary disease. EKG shows normal sinus rhythm, no major ST or T changes.   IMPRESSION AND  PLAN: 1. Acute on chronic respiratory failure, likely due to chronic obstructive pulmonary disease exacerbation. We will continue Advair, Spiriva. Start her on IV steroids, antibiotics empirically. Consult pulmonary. Continue nebulizer breathing treatment. 2. Chronic obstructive pulmonary disease exacerbation. Management as above. Consult pulmonary. Discussed the case with Dr. Meredeth Ide, who will see her.  3. Hypertension. We will continue home medication. Monitor her blood pressure.  4. Tobacco abuse.  She was counseled for about 3 minutes. She is really trying hard to quit, has been relapsing several times. As of now, she is using 7 mg nicotine patch, which we will continue at this time.   TOTAL TIME TAKING CARE OF THIS PATIENT: 55 minutes.   CRITICAL CARE: She remains at very high risk for cardiovascular failure and if she deteriorates, she may need intubation.   CODE STATUS: Full code.    ____________________________ Ellamae Sia. Sherryll Burger, MD vss:mw D: 01/11/2015 18:36:59 ET T: 01/11/2015 19:20:54 ET JOB#: [id number]  cc: Enzley Kitchens S. Sherryll Burger, MD, <Dictator> [facility] Herbon [name]. Meredeth Ide, MD Ellamae [facility] MD ELECTRONICALLY SIGNED 01/13/2015 10:15

## 2015-04-24 NOTE — H&P (Signed)
PATIENT NAME:  Ashley [name], Ashley [name] MR#:  [id number] DATE OF BIRTH:  Jul 31, 1956  DATE OF ADMISSION:  01/15/2015  PRIMARY CARE PHYSICIAN: Nonlocal.   REFERRING PHYSICIAN: Cory R. York Cerise, MD   CHIEF COMPLAINT: Shortness of breath.   HISTORY OF PRESENT ILLNESS: Ashley [name] is a 59 year old female with a history of COPD, malnutrition, hypertension, continued tobacco use who comes to the Emergency Department with severe shortness of breath. The patient had a recent admission on 01/11/2015 and was discharged on 01/13/2015 with good improvement of her shortness of breath. The patient states this evening the patient started to experience irritation in the throat followed by further worsening of the shortness of breath, which gradually worsened to the point that called EMS and was brought to the Emergency Department. The patient was diffusely wheezing. The patient was initially placed on BiPAP and given multiple breathing treatments with some improvement. The patient also received Rocephin, Zithromax, and Solu-Medrol.   PAST MEDICAL HISTORY:  1.  COPD.  2.  Hypertension.   ALLERGIES: No known drug allergies.   PAST SURGICAL HISTORY: Hysterectomy.   HOME MEDICATIONS:  1.  Spiriva 1 capsule once a day.  2.  Proventil 2 puffs 4 times as needed.  3.  Prednisone 40 mg daily.  4.  Nicotine as needed.  5.  Lisinopril 10 mg daily.  6.  Hydrochlorothiazide once a day.  7.  Fluticasone/Solu-Medrol 1 puff 2 times a day.  8.  DuoNebs 4 times a day.  9.  Azithromycin 250 mg once a day.  10.  Aspirin 81 mg daily.  11.  Amlodipine 5 mg daily.   SOCIAL HISTORY: Continues to smoke 1/2 pack a day. Denies drinking alcohol or using illicit drugs. Lives with her husband.   FAMILY HISTORY: Positive for coronary artery disease.   REVIEW OF SYSTEMS:  CONSTITUTIONAL: Generalized weakness.  EYES: No change in vision.  EAR, NOSE, AND THROAT: No change in hearing.  RESPIRATORY: Has cough, shortness of breath.   CARDIOVASCULAR: No chest pain, palpations.  GASTROINTESTINAL: No nausea, vomiting, abdominal pain.  GENITOURINARY: No dysuria or hematuria.  HEMATOLOGIC: No easy bruising or bleeding.  SKIN: No rash or lesions.  MUSCULOSKELETAL: No joint pains and aches.  NEUROLOGIC: No weakness or numbness in any part of the body.   PHYSICAL EXAMINATION:  GENERAL: This is a thin-built female lying down in the bed on BiPAP.  VITAL SIGNS: Temperature 97.8, pulse 99, blood pressure 157/80, respiratory rate of 26, oxygen saturation 97% on BiPAP.  HEENT: Head normocephalic, atraumatic. There is no scleral icterus. Conjunctivae normal. Pupils equal and react to light. Mucous membranes moist. No pharyngeal erythema.  NECK: Supple. No lymphadenopathy. No JVD. No carotid bruit. No thyromegaly.  CHEST: Has no focal tenderness. Bilateral diffuse wheezing. Has air entry bilaterally. Able to speak full sentences, she sometimes takes a pause about three-fourths of the sentence.   HEART: S1, S2, regular, tachycardia.  ABDOMEN: Bowel sounds present. Soft, nontender, nondistended. No hepatosplenomegaly.  EXTREMITIES: No pedal edema. Pulses 2+.  NEUROLOGIC: The patient is oriented to place, person, and time. Cranial nerves II-XII intact. Motor 5/5 in upper and lower extremities.   LABORATORY DATA: ABG: PH of 7.34, pCO2 of 74. CBC, CMP are completely within normal limits.   ASSESSMENT AND PLAN:  1.  Chronic obstructive pulmonary disease exacerbation: Continue with the breathing treatments and Solu-Medrol. Considering the patient's recent admission, the patient is started on vancomycin and cefepime, treating as a healthcare-associated pneumonia.  2.  Hypercarbic respiratory  failure: The patient was placed on BiPAP. We will repeat the ABG and admit the patient to a monitored bed.  3.  Hypertension, moderately controlled: Continue with home medications.  4.  Continued tobacco use: Counseled with the patient. The patient  expressed understanding.  5.  Debility: Involve physical therapy.  6.  Protein calorie malnutrition: Dietary consult. 7.  Keep the patient on deep vein thrombosis prophylaxis with Lovenox.   TIME SPENT: 50 minutes.    ____________________________ Susa GriffinsPadmaja Kiara Keep, MD pv:bm D: 01/15/2015 01:54:38 ET T: 01/15/2015 02:18:41 ET JOB#: [id number]  cc: Susa GriffinsPadmaja Capone Schwinn, MD, <Dictator> Susa GriffinsPADMAJA Thierry Dobosz MD ELECTRONICALLY SIGNED 01/15/2015 21:18

## 2015-07-31 ENCOUNTER — Emergency Department: Payer: Medicaid Other

## 2015-07-31 ENCOUNTER — Emergency Department
Admission: EM | Admit: 2015-07-31 | Discharge: 2015-07-31 | Disposition: A | Discharge status: Home / Self Care | Payer: Medicaid Other | Attending: Emergency Medicine | Admitting: Emergency Medicine

## 2015-07-31 ENCOUNTER — Encounter: Payer: Self-pay | Admitting: Medical Oncology

## 2015-07-31 ENCOUNTER — Other Ambulatory Visit: Payer: Self-pay

## 2015-07-31 DIAGNOSIS — Z720 Tobacco use: Secondary | ICD-10-CM | POA: Insufficient documentation

## 2015-07-31 DIAGNOSIS — J441 Chronic obstructive pulmonary disease with (acute) exacerbation: Secondary | ICD-10-CM | POA: Insufficient documentation

## 2015-07-31 DIAGNOSIS — I10 Essential (primary) hypertension: Secondary | ICD-10-CM | POA: Insufficient documentation

## 2015-07-31 HISTORY — DX: Essential (primary) hypertension: I10

## 2015-07-31 HISTORY — DX: Chronic obstructive pulmonary disease, unspecified: J44.9

## 2015-07-31 MED ORDER — PREDNISONE 20 MG PO TABS: 40.0000 mg | ORAL_TABLET | Freq: Every day | ORAL | Status: DC

## 2015-07-31 MED ORDER — PREDNISONE 20 MG PO TABS
40.0000 mg | ORAL_TABLET | Freq: Once | ORAL | Status: AC
  Administered 2015-07-31: 40 mg via ORAL
  Filled 2015-07-31: qty 2

## 2015-07-31 MED ORDER — IPRATROPIUM-ALBUTEROL 0.5-2.5 (3) MG/3ML IN SOLN
3.0000 mL | Freq: Once | RESPIRATORY_TRACT | Status: AC
  Administered 2015-07-31: 3 mL via RESPIRATORY_TRACT
  Filled 2015-07-31: qty 3

## 2015-07-31 NOTE — ED Provider Notes (Signed)
[facility] Emergency Department Provider Note  ____________________________________________  Time seen: 3:15 PM  I have reviewed the triage vital signs and the nursing notes.   HISTORY  Chief Complaint Shortness of Breath    HPI Ashley [name] is a 59 y.o. female who complains of central chest tightness and shortness of breath since last night. It's been constant since then and feels like her usual COPD. She notes that several of her husbands coworkers have been sick recently with viral illnesses, and he's been feeling ill for the past week or so, and she's been feeling fatigued with some intermittent nausea for the past 2 or 3 days. She denies any increased cough or sputum production. No fever chills dizziness syncope abdominal pain diarrhea. She used her home nebulizer which did help momentarily but the relief did not last.  No recent travel, hospitalization or surgery.  The chest tightness has been constant since last night. It is nonradiating, no associated vomiting diaphoresis. It is not exertional nor pleuritic.   Past Medical History  Diagnosis Date  . COPD (chronic obstructive pulmonary disease)   . Hypertension     There are no active problems to display for this patient.   Past Surgical History  Procedure Laterality Date  . Abdominal hysterectomy      Current Outpatient Rx  Name  Route  Sig  Dispense  Refill  . predniSONE (DELTASONE) 20 MG tablet   Oral   Take 2 tablets (40 mg total) by mouth daily.   8 tablet   0     Allergies Review of patient's allergies indicates no known allergies.  No family history on file.  Social History History  Substance Use Topics  . Smoking status: Current Every Day Smoker -- 1.00 packs/day    Types: Cigarettes  . Smokeless tobacco: Not on file  . Alcohol Use: No    Review of Systems  Constitutional: No fever or chills. No weight changes Eyes:No blurry vision or double vision.  ENT: No sore  throat. Cardiovascular: Chest tightness as above  Respiratory: Dyspnea without cough as above Gastrointestinal: Negative for abdominal pain, vomiting and diarrhea.  No BRBPR or melena. Genitourinary: Negative for dysuria, urinary retention, bloody urine, or difficulty urinating. Musculoskeletal: Negative for back pain. No joint swelling or pain. Skin: Negative for rash. Neurological: Negative for headaches, focal weakness or numbness. Psychiatric:No anxiety or depression.   Endocrine:No hot/cold intolerance, changes in energy, or sleep difficulty.  10-point ROS otherwise negative.  ____________________________________________   PHYSICAL EXAM:  VITAL SIGNS: ED Triage Vitals  Enc Vitals Group     BP 07/31/15 1444 184/89 mmHg     Pulse Rate 07/31/15 1444 86     Resp 07/31/15 1444 20     Temp 07/31/15 1444 98.1 F (36.7 C)     Temp Source 07/31/15 1444 Oral     SpO2 07/31/15 1444 95 %     Weight 07/31/15 1444 75 lb (34.02 kg)     Height 07/31/15 1444  (1.575 m)     Head Cir --      Peak Flow --      Pain Score --      Pain Loc --      Pain Edu? --      Excl. in GC? --      Constitutional: Alert and oriented. Well appearing and in no distress. Gaunt appearance Eyes: No scleral icterus. No conjunctival pallor. PERRL. EOMI ENT   Head: Normocephalic and  atraumatic.   Nose: No congestion/rhinnorhea. No septal hematoma   Mouth/Throat: MMM, no pharyngeal erythema. No peritonsillar mass. No uvula shift.   Neck: No stridor. No SubQ emphysema. No meningismus. Hematological/Lymphatic/Immunilogical: No cervical lymphadenopathy. Cardiovascular: RRR. Normal and symmetric distal pulses are present in all extremities. No murmurs, rubs, or gallops. Respiratory: Distant ref sounds in all fields. Mildly prolonged expiratory phase with inducible wheezing on forced expiration. No focal crackles or consolidation.. Gastrointestinal: Soft and nontender. No distention. There is  no CVA tenderness.  No rebound, rigidity, or guarding. No bruit or pulsatile mass Genitourinary: deferred Musculoskeletal: Nontender with normal range of motion in all extremities. No joint effusions.  No lower extremity tenderness.  No edema. Neurologic:   Normal speech and language.  CN 2-10 normal. Motor grossly intact. No pronator drift.  Normal gait. No gross focal neurologic deficits are appreciated.  Skin:  Skin is warm, dry and intact. No rash noted.  No petechiae, purpura, or bullae. Psychiatric: Mood and affect are normal. Speech and behavior are normal. Patient exhibits appropriate insight and judgment.  ____________________________________________    LABS (pertinent positives/negatives) (all labs ordered are listed, but only abnormal results are displayed) Labs Reviewed - No data to display ____________________________________________   EKG  Interpreted by me Normal sinus rhythm rate of 86, normal axis intervals QRS ST segments and T waves.  ____________________________________________    RADIOLOGY  Chest x-ray reveals emphysema but otherwise unremarkable  ____________________________________________   PROCEDURES  ____________________________________________   INITIAL IMPRESSION / ASSESSMENT AND PLAN / ED COURSE  Pertinent labs & imaging results that were available during my care of the patient were reviewed by me and considered in my medical decision making (see chart for details).  Patient presents with dyspnea that feels like her usual COPD. This appears to be exacerbation in the setting of a viral illness. The patient is otherwise well-appearing not in severe distress. Her vital signs are unremarkable except for chronic hypertension. Her exam is consistent with emphysema. Low suspicion for ACS PE TAD pneumothorax carditis mediastinitis pneumonia or sepsis. We'll give prednisone DuoNeb and check chest x-ray. Anticipate discharge home on a short course of  prednisone. ----------------------------------------- 4:42 PM on 07/31/2015 -----------------------------------------  Vital signs stable, comfortable, feeling better. We'll discharge home on prednisone. ____________________________________________   FINAL CLINICAL IMPRESSION(S) / ED DIAGNOSES  Final diagnoses:  COPD with acute exacerbation      Sharman Cheek, MD 07/31/15 1642

## 2015-07-31 NOTE — Discharge Instructions (Signed)

## 2015-07-31 NOTE — ED Notes (Signed)
AAOx3.  Skin warm and dry. No SOB/ DOE.  D/C home. 

## 2015-07-31 NOTE — ED Notes (Signed)
Pt from home via ems with reports that she began having some sob and felt "tight" with breathing last night. Pt has hx of copd. Denies pain.

## 2015-09-15 ENCOUNTER — Other Ambulatory Visit: Payer: Self-pay

## 2015-09-15 ENCOUNTER — Emergency Department
Admission: EM | Admit: 2015-09-15 | Discharge: 2015-09-15 | Disposition: A | Discharge status: Home / Self Care | Payer: Medicaid Other | Attending: Emergency Medicine | Admitting: Emergency Medicine

## 2015-09-15 ENCOUNTER — Emergency Department: Payer: Medicaid Other

## 2015-09-15 DIAGNOSIS — I10 Essential (primary) hypertension: Secondary | ICD-10-CM | POA: Insufficient documentation

## 2015-09-15 DIAGNOSIS — Z720 Tobacco use: Secondary | ICD-10-CM | POA: Insufficient documentation

## 2015-09-15 DIAGNOSIS — J449 Chronic obstructive pulmonary disease, unspecified: Secondary | ICD-10-CM

## 2015-09-15 DIAGNOSIS — J441 Chronic obstructive pulmonary disease with (acute) exacerbation: Secondary | ICD-10-CM | POA: Insufficient documentation

## 2015-09-15 DIAGNOSIS — R0602 Shortness of breath: Secondary | ICD-10-CM

## 2015-09-15 LAB — CBC WITH DIFFERENTIAL/PLATELET
Basophils Absolute: 0 10*3/uL (ref 0–0.1)
Basophils Relative: 0 %
Eosinophils Absolute: 0 10*3/uL (ref 0–0.7)
Eosinophils Relative: 1 %
HEMATOCRIT: 40 % (ref 35.0–47.0)
Hemoglobin: 13.8 g/dL (ref 12.0–16.0)
LYMPHS ABS: 1.2 10*3/uL (ref 1.0–3.6)
LYMPHS PCT: 14 %
MCH: 31 pg (ref 26.0–34.0)
MCHC: 34.6 g/dL (ref 32.0–36.0)
MCV: 89.4 fL (ref 80.0–100.0)
MONO ABS: 0.6 10*3/uL (ref 0.2–0.9)
MONOS PCT: 8 %
Neutro Abs: 6.6 10*3/uL — ABNORMAL HIGH (ref 1.4–6.5)
Neutrophils Relative %: 77 %
Platelets: 221 10*3/uL (ref 150–440)
RBC: 4.47 MIL/uL (ref 3.80–5.20)
RDW: 13 % (ref 11.5–14.5)
WBC: 8.5 10*3/uL (ref 3.6–11.0)

## 2015-09-15 LAB — BASIC METABOLIC PANEL
ANION GAP: 7 (ref 5–15)
BUN: 8 mg/dL (ref 6–20)
CO2: 29 mmol/L (ref 22–32)
Calcium: 9.1 mg/dL (ref 8.9–10.3)
Chloride: 107 mmol/L (ref 101–111)
Creatinine, Ser: 0.53 mg/dL (ref 0.44–1.00)
GFR calc Af Amer: >60 mL/min (ref >60)
GFR calc non Af Amer: >60 mL/min (ref >60)
GLUCOSE: 182 mg/dL — AB (ref 65–99)
Potassium: 3.1 mmol/L — ABNORMAL LOW (ref 3.5–5.1)
Sodium: 143 mmol/L (ref 135–145)

## 2015-09-15 LAB — TROPONIN I: Troponin I: <0.03 ng/mL (ref <0.031)

## 2015-09-15 MED ORDER — IPRATROPIUM-ALBUTEROL 0.5-2.5 (3) MG/3ML IN SOLN
3.0000 mL | Freq: Once | RESPIRATORY_TRACT | Status: AC
  Administered 2015-09-15: 3 mL via RESPIRATORY_TRACT

## 2015-09-15 MED ORDER — PREDNISONE 20 MG PO TABS: 40.0000 mg | ORAL_TABLET | Freq: Every day | ORAL | Status: DC

## 2015-09-15 NOTE — Discharge Instructions (Signed)
Please seek medical attention for any high fevers, chest pain, shortness of breath, change in behavior, persistent vomiting, bloody stool or any other new or concerning symptoms. ° °Chronic Obstructive Pulmonary Disease °Chronic obstructive pulmonary disease (COPD) is a common lung condition in which airflow from the lungs is limited. COPD is a general term that can be used to describe many different lung problems that limit airflow, including both chronic bronchitis and emphysema.  If you have COPD, your lung function will probably never return to normal, but there are measures you can take to improve lung function and make yourself feel better.  °CAUSES  °· Smoking (common).   °· Exposure to secondhand smoke.   °· Genetic problems. °· Chronic inflammatory lung diseases or recurrent infections. °SYMPTOMS  °· Shortness of breath, especially with physical activity.   °· Deep, persistent (chronic) cough with a large amount of thick mucus.   °· Wheezing.   °· Rapid breaths (tachypnea).   °· Gray or bluish discoloration (cyanosis) of the skin, especially in fingers, toes, or lips.   °· Fatigue.   °· Weight loss.   °· Frequent infections or episodes when breathing symptoms become much worse (exacerbations).   °· Chest tightness. °DIAGNOSIS  °Your health care provider will take a medical history and perform a physical examination to make the initial diagnosis.  Additional tests for COPD may include:  °· Lung (pulmonary) function tests. °· Chest X-ray. °· CT scan. °· Blood tests. °TREATMENT  °Treatment available to help you feel better when you have COPD includes:  °· Inhaler and nebulizer medicines. These help manage the symptoms of COPD and make your breathing more comfortable. °· Supplemental oxygen. Supplemental oxygen is only helpful if you have a low oxygen level in your blood.   °· Exercise and physical activity. These are beneficial for nearly all people with COPD. Some people may also benefit from a pulmonary  rehabilitation program. °HOME CARE INSTRUCTIONS  °· Take all medicines (inhaled or pills) as directed by your health care provider. °· Avoid over-the-counter medicines or cough syrups that dry up your airway (such as antihistamines) and slow down the elimination of secretions unless instructed otherwise by your health care provider.   °· If you are a smoker, the most important thing that you can do is stop smoking. Continuing to smoke will cause further lung damage and breathing trouble. Ask your health care provider for help with quitting smoking. He or she can direct you to community resources or hospitals that provide support. °· Avoid exposure to irritants such as smoke, chemicals, and fumes that aggravate your breathing. °· Use oxygen therapy and pulmonary rehabilitation if directed by your health care provider. If you require home oxygen therapy, ask your health care provider whether you should purchase a pulse oximeter to measure your oxygen level at home.   °· Avoid contact with individuals who have a contagious illness. °· Avoid extreme temperature and humidity changes. °· Eat healthy foods. Eating smaller, more frequent meals and resting before meals may help you maintain your strength. °· Stay active, but balance activity with periods of rest. Exercise and physical activity will help you maintain your ability to do things you want to do. °· Preventing infection and hospitalization is very important when you have COPD. Make sure to receive all the vaccines your health care provider recommends, especially the pneumococcal and influenza vaccines. Ask your health care provider whether you need a pneumonia vaccine. °· Learn and use relaxation techniques to manage stress. °· Learn and use controlled breathing techniques as directed by your health care provider.   Controlled breathing techniques include:   °¨ Pursed lip breathing. Start by breathing in (inhaling) through your nose for 1 second. Then, purse your  lips as if you were going to whistle and breathe out (exhale) through the pursed lips for 2 seconds.   °¨ Diaphragmatic breathing. Start by putting one hand on your abdomen just above your waist. Inhale slowly through your nose. The hand on your abdomen should move out. Then purse your lips and exhale slowly. You should be able to feel the hand on your abdomen moving in as you exhale.   °· Learn and use controlled coughing to clear mucus from your lungs. Controlled coughing is a series of short, progressive coughs. The steps of controlled coughing are:   °1. Lean your head slightly forward.   °2. Breathe in deeply using diaphragmatic breathing.   °3. Try to hold your breath for 3 seconds.   °4. Keep your mouth slightly open while coughing twice.   °5. Spit any mucus out into a tissue.   °6. Rest and repeat the steps once or twice as needed. °SEEK MEDICAL CARE IF:  °· You are coughing up more mucus than usual.   °· There is a change in the color or thickness of your mucus.   °· Your breathing is more labored than usual.   °· Your breathing is faster than usual.   °SEEK IMMEDIATE MEDICAL CARE IF:  °· You have shortness of breath while you are resting.   °· You have shortness of breath that prevents you from: °¨ Being able to talk.   °¨ Performing your usual physical activities.   °· You have chest pain lasting longer than 5 minutes.   °· Your skin color is more cyanotic than usual. °· You measure low oxygen saturations for longer than 5 minutes with a pulse oximeter. °MAKE SURE YOU:  °· Understand these instructions. °· Will watch your condition. °· Will get help right away if you are not doing well or get worse. °Document Released: 09/19/2005 Document Revised: 04/26/2014 Document Reviewed: 08/06/2013 °[employer]® Patient Information ©2015 [employer]. This information is not intended to replace advice given to you by your health care provider. Make sure you discuss any questions you have with your health care  provider. ° ° °

## 2015-09-15 NOTE — ED Notes (Signed)
According to EMS, pt had respiratory distress 1hr ago, x1 albuterol treatment at home self administered. EMS administered x1 duoneb and  of solu-medrol.

## 2015-09-15 NOTE — ED Provider Notes (Signed)
[facility] Emergency Department [place] Note   ____________________________________________  Time seen: On EMS arrival  I have reviewed the triage vital signs and the nursing notes.   HISTORY  Chief Complaint No chief complaint on file.   History limited by: Not Limited   HPI Ashley [name] is a 59 y.o. female who presents to the emergency department today via EMS because of shortness of breath. The patient states she is feeling slightly unwell for the past couple of days. This afternoon however her shortness of breath got worse. She does have a history of COPD and states that this feels like her previous COPD exacerbations. She denies any chest pain but does have some associated chest tightness. She has tried her albuterol inhaler at home. She denies any fevers. Denies any significant cough.   Past Medical History  Diagnosis Date  . COPD (chronic obstructive pulmonary disease)   . Hypertension     There are no active problems to display for this patient.   Past Surgical History  Procedure Laterality Date  . Abdominal hysterectomy      Current Outpatient Rx  Name  Route  Sig  Dispense  Refill  . predniSONE (DELTASONE) 20 MG tablet   Oral   Take 2 tablets (40 mg total) by mouth daily.   8 tablet   0     Allergies Review of patient's allergies indicates no known allergies.  No family history on file.  Social History Social History  Substance Use Topics  . Smoking status: Current Every Day Smoker -- 1.00 packs/day    Types: Cigarettes  . Smokeless tobacco: Not on file  . Alcohol Use: No    Review of Systems  Constitutional: Negative for fever. Cardiovascular: Negative for chest pain. Respiratory: positive for shortness of breath. Gastrointestinal: Negative for abdominal pain, vomiting and diarrhea. Genitourinary: Negative for dysuria. Musculoskeletal: Negative for back pain. Skin: Negative for rash. Neurological: Negative for  headaches, focal weakness or numbness.  10-point ROS otherwise negative.  ____________________________________________   PHYSICAL EXAM:  VITAL SIGNS:   99.4 F (37.4 C)  89  19   169/99 mmHg  100 %     Constitutional: Alert and oriented. Appears in mild respiratory distress Eyes: Conjunctivae are normal. PERRL. Normal extraocular movements. ENT   Head: Normocephalic and atraumatic.   Nose: No congestion/rhinnorhea.   Mouth/Throat: Mucous membranes are moist.   Neck: No stridor. Hematological/Lymphatic/Immunilogical: No cervical lymphadenopathy. Cardiovascular: Normal rate, regular rhythm.  No murmurs, rubs, or gallops. Respiratory: mildly increased respiratory effort. Type breath sounds bilaterally. Gastrointestinal: Soft and nontender. No distention.  Genitourinary: Deferred Musculoskeletal: Normal range of motion in all extremities. No joint effusions.  No lower extremity tenderness nor edema. Neurologic:  Normal speech and language. No gross focal neurologic deficits are appreciated. Speech is normal.  Skin:  Skin is warm, dry and intact. No rash noted. Psychiatric: Mood and affect are normal. Speech and behavior are normal. Patient exhibits appropriate insight and judgment.  ____________________________________________    LABS (pertinent positives/negatives)  Labs Reviewed  CBC WITH DIFFERENTIAL/PLATELET - Abnormal; Notable for the following:    Neutro Abs 6.6 (*)    All other components within normal limits  BASIC METABOLIC PANEL - Abnormal; Notable for the following:    Potassium 3.1 (*)    Glucose, Bld 182 (*)    All other components within normal limits  TROPONIN I     ____________________________________________   EKG  I, Phineas Semen, attending physician, personally viewed  and interpreted this EKG  EKG Time: 2014 Rate: 95 Rhythm: NSR Axis: normal Intervals: qtc 462 QRS: narrow ST changes: no st elevation Impression: no acute  STEMI   ____________________________________________    RADIOLOGY  CXR IMPRESSION: 1. No acute cardiopulmonary disease. 2. Advanced COPD.   ____________________________________________   PROCEDURES  Procedure(s) performed: None  Critical Care performed: No  ____________________________________________   INITIAL IMPRESSION / ASSESSMENT AND PLAN / ED COURSE  Pertinent labs & imaging results that were available during my care of the patient were reviewed by me and considered in my medical decision making (see chart for details).  Patient presented to the emergency department today with concerns for shortness breath and COPD exacerbation. The patient received Solu-Medrol by EMS and one DuoNeb by EMS. Patient received 2 more DuoNeb's here in the emergency department. She stated she felt better. Felt ready to go home at time of discharge. Will give prescription for steroids.  ____________________________________________   FINAL CLINICAL IMPRESSION(S) / ED DIAGNOSES  Final diagnoses:  Chronic obstructive pulmonary disease, unspecified COPD, unspecified chronic bronchitis type  Shortness of breath     Phineas Semen, MD 09/16/15 [phone]

## 2015-09-22 ENCOUNTER — Emergency Department: Payer: Self-pay

## 2015-09-22 ENCOUNTER — Encounter: Payer: Self-pay | Admitting: Emergency Medicine

## 2015-09-22 ENCOUNTER — Observation Stay
Admission: EM | Admit: 2015-09-22 | Discharge: 2015-09-24 | Disposition: A | Discharge status: Home / Self Care | Payer: Self-pay | Attending: Internal Medicine | Admitting: Internal Medicine

## 2015-09-22 DIAGNOSIS — Z8249 Family history of ischemic heart disease and other diseases of the circulatory system: Secondary | ICD-10-CM | POA: Insufficient documentation

## 2015-09-22 DIAGNOSIS — I272 Other secondary pulmonary hypertension: Secondary | ICD-10-CM | POA: Insufficient documentation

## 2015-09-22 DIAGNOSIS — F17210 Nicotine dependence, cigarettes, uncomplicated: Secondary | ICD-10-CM | POA: Insufficient documentation

## 2015-09-22 DIAGNOSIS — K760 Fatty (change of) liver, not elsewhere classified: Secondary | ICD-10-CM | POA: Insufficient documentation

## 2015-09-22 DIAGNOSIS — Z7982 Long term (current) use of aspirin: Secondary | ICD-10-CM | POA: Insufficient documentation

## 2015-09-22 DIAGNOSIS — E43 Unspecified severe protein-calorie malnutrition: Secondary | ICD-10-CM | POA: Insufficient documentation

## 2015-09-22 DIAGNOSIS — R000 Tachycardia, unspecified: Secondary | ICD-10-CM | POA: Insufficient documentation

## 2015-09-22 DIAGNOSIS — I10 Essential (primary) hypertension: Secondary | ICD-10-CM | POA: Insufficient documentation

## 2015-09-22 DIAGNOSIS — F419 Anxiety disorder, unspecified: Secondary | ICD-10-CM | POA: Insufficient documentation

## 2015-09-22 DIAGNOSIS — J439 Emphysema, unspecified: Secondary | ICD-10-CM | POA: Insufficient documentation

## 2015-09-22 DIAGNOSIS — J449 Chronic obstructive pulmonary disease, unspecified: Secondary | ICD-10-CM | POA: Insufficient documentation

## 2015-09-22 DIAGNOSIS — Z681 Body mass index (BMI) 19 or less, adult: Secondary | ICD-10-CM | POA: Insufficient documentation

## 2015-09-22 DIAGNOSIS — Z79899 Other long term (current) drug therapy: Secondary | ICD-10-CM | POA: Insufficient documentation

## 2015-09-22 DIAGNOSIS — Z7901 Long term (current) use of anticoagulants: Secondary | ICD-10-CM | POA: Insufficient documentation

## 2015-09-22 DIAGNOSIS — Z801 Family history of malignant neoplasm of trachea, bronchus and lung: Secondary | ICD-10-CM | POA: Insufficient documentation

## 2015-09-22 DIAGNOSIS — R079 Chest pain, unspecified: Secondary | ICD-10-CM

## 2015-09-22 DIAGNOSIS — Z8042 Family history of malignant neoplasm of prostate: Secondary | ICD-10-CM | POA: Insufficient documentation

## 2015-09-22 DIAGNOSIS — J479 Bronchiectasis, uncomplicated: Secondary | ICD-10-CM | POA: Insufficient documentation

## 2015-09-22 DIAGNOSIS — D72829 Elevated white blood cell count, unspecified: Secondary | ICD-10-CM | POA: Insufficient documentation

## 2015-09-22 DIAGNOSIS — Z7951 Long term (current) use of inhaled steroids: Secondary | ICD-10-CM | POA: Insufficient documentation

## 2015-09-22 DIAGNOSIS — R0781 Pleurodynia: Principal | ICD-10-CM | POA: Insufficient documentation

## 2015-09-22 HISTORY — DX: Anxiety disorder, unspecified: F41.9

## 2015-09-22 LAB — BASIC METABOLIC PANEL
Anion gap: 9 (ref 5–15)
BUN: 8 mg/dL (ref 6–20)
CO2: 29 mmol/L (ref 22–32)
CREATININE: 0.62 mg/dL (ref 0.44–1.00)
Calcium: 9.1 mg/dL (ref 8.9–10.3)
Chloride: 98 mmol/L — ABNORMAL LOW (ref 101–111)
GFR calc non Af Amer: >60 mL/min (ref >60)
Glucose, Bld: 125 mg/dL — ABNORMAL HIGH (ref 65–99)
POTASSIUM: 3.5 mmol/L (ref 3.5–5.1)
SODIUM: 136 mmol/L (ref 135–145)

## 2015-09-22 LAB — CBC
HCT: 45.7 % (ref 35.0–47.0)
Hemoglobin: 15.2 g/dL (ref 12.0–16.0)
MCH: 30 pg (ref 26.0–34.0)
MCHC: 33.4 g/dL (ref 32.0–36.0)
MCV: 89.8 fL (ref 80.0–100.0)
PLATELETS: 304 10*3/uL (ref 150–440)
RBC: 5.09 MIL/uL (ref 3.80–5.20)
RDW: 12.7 % (ref 11.5–14.5)
WBC: 18.7 10*3/uL — AB (ref 3.6–11.0)

## 2015-09-22 LAB — TROPONIN I
Troponin I: <0.03 ng/mL (ref <0.031)
Troponin I: <0.03 ng/mL (ref <0.031)

## 2015-09-22 MED ORDER — LISINOPRIL 10 MG PO TABS
10.0000 mg | ORAL_TABLET | Freq: Every day | ORAL | Status: DC
  Administered 2015-09-22: 10 mg via ORAL
  Filled 2015-09-22: qty 1

## 2015-09-22 MED ORDER — ACETAMINOPHEN 325 MG PO TABS: 650.0000 mg | ORAL_TABLET | Freq: Four times a day (QID) | ORAL | Status: DC | PRN

## 2015-09-22 MED ORDER — ENOXAPARIN SODIUM 40 MG/0.4ML ~~LOC~~ SOLN: 40.0000 mg | SUBCUTANEOUS | Status: DC

## 2015-09-22 MED ORDER — IOHEXOL 350 MG/ML SOLN
75.0000 mL | Freq: Once | INTRAVENOUS | Status: AC | PRN
  Administered 2015-09-22: 75 mL via INTRAVENOUS

## 2015-09-22 MED ORDER — ONDANSETRON HCL 4 MG/2ML IJ SOLN
4.0000 mg | Freq: Four times a day (QID) | INTRAMUSCULAR | Status: DC | PRN
  Administered 2015-09-23: 4 mg via INTRAVENOUS
  Filled 2015-09-22: qty 2

## 2015-09-22 MED ORDER — IPRATROPIUM-ALBUTEROL 0.5-2.5 (3) MG/3ML IN SOLN
3.0000 mL | Freq: Four times a day (QID) | RESPIRATORY_TRACT | Status: DC
  Administered 2015-09-22 – 2015-09-24 (×6): 3 mL via RESPIRATORY_TRACT
  Filled 2015-09-22 (×7): qty 3

## 2015-09-22 MED ORDER — ACETAMINOPHEN 650 MG RE SUPP: 650.0000 mg | Freq: Four times a day (QID) | RECTAL | Status: DC | PRN

## 2015-09-22 MED ORDER — HYDROCHLOROTHIAZIDE 12.5 MG PO CAPS
12.5000 mg | ORAL_CAPSULE | Freq: Every day | ORAL | Status: DC
  Administered 2015-09-22: 12.5 mg via ORAL
  Filled 2015-09-22: qty 1

## 2015-09-22 MED ORDER — ASPIRIN EC 81 MG PO TBEC
81.0000 mg | DELAYED_RELEASE_TABLET | Freq: Every day | ORAL | Status: DC
  Administered 2015-09-22 – 2015-09-24 (×3): 81 mg via ORAL
  Filled 2015-09-22 (×3): qty 1

## 2015-09-22 MED ORDER — TIOTROPIUM BROMIDE MONOHYDRATE 18 MCG IN CAPS
1.0000 | ORAL_CAPSULE | Freq: Every day | RESPIRATORY_TRACT | Status: DC
  Administered 2015-09-23 – 2015-09-24 (×2): 18 ug via RESPIRATORY_TRACT
  Filled 2015-09-22: qty 5

## 2015-09-22 MED ORDER — MOMETASONE FURO-FORMOTEROL FUM 200-5 MCG/ACT IN AERO
2.0000 | INHALATION_SPRAY | Freq: Two times a day (BID) | RESPIRATORY_TRACT | Status: DC
  Administered 2015-09-22 – 2015-09-24 (×4): 2 via RESPIRATORY_TRACT
  Filled 2015-09-22: qty 8.8

## 2015-09-22 MED ORDER — MORPHINE SULFATE (PF) 2 MG/ML IV SOLN: 2.0000 mg | Freq: Four times a day (QID) | INTRAVENOUS | Status: DC | PRN

## 2015-09-22 MED ORDER — SODIUM CHLORIDE 0.9 % IJ SOLN
3.0000 mL | Freq: Two times a day (BID) | INTRAMUSCULAR | Status: DC
  Administered 2015-09-22 – 2015-09-24 (×4): 3 mL via INTRAVENOUS

## 2015-09-22 MED ORDER — NICOTINE 21 MG/24HR TD PT24
21.0000 mg | MEDICATED_PATCH | Freq: Every day | TRANSDERMAL | Status: DC
  Administered 2015-09-22 – 2015-09-24 (×3): 21 mg via TRANSDERMAL
  Filled 2015-09-22 (×3): qty 1

## 2015-09-22 MED ORDER — ONDANSETRON HCL 4 MG PO TABS: 4.0000 mg | ORAL_TABLET | Freq: Four times a day (QID) | ORAL | Status: DC | PRN

## 2015-09-22 MED ORDER — HYDRALAZINE HCL 20 MG/ML IJ SOLN: 10.0000 mg | Freq: Four times a day (QID) | INTRAMUSCULAR | Status: DC | PRN

## 2015-09-22 MED ORDER — OXYCODONE HCL 5 MG PO TABS: 5.0000 mg | ORAL_TABLET | ORAL | Status: DC | PRN

## 2015-09-22 MED ORDER — ONDANSETRON HCL 4 MG/2ML IJ SOLN
4.0000 mg | Freq: Once | INTRAMUSCULAR | Status: AC
  Administered 2015-09-22: 4 mg via INTRAVENOUS
  Filled 2015-09-22: qty 2

## 2015-09-22 NOTE — H&P (Addendum)
[facility] [place] at [facility]   PATIENT NAME: Ashley [name]    MR#:  [id number]  DATE OF BIRTH:  [birth year]  DATE OF ADMISSION:  09/22/2015  PRIMARY CARE PHYSICIAN: [facility]  REQUESTING/REFERRING PHYSICIAN: Jene Every  CHIEF COMPLAINT:   Chief Complaint  Patient presents with  . Chest Pain    HISTORY OF PRESENT ILLNESS:  Ashley [name]  is a 59 y.o. female with a known history of COPD not on any home oxygen, anxiety and hypertension presents to the hospital secondary to chest pain that started yesterday. Pain is around the left shoulder blade, pleuritic in nature whenever she is taking deep breath. She is very concerned shin she has significant cardiac history in the family. It is also associated with some nausea. No shortness of breath, cough or fevers or chills noted. Patient just finished her prednisone taper 2 days ago. Yesterday she used her Spiriva in the morning after which she complains of chest pain has started. She is also tachycardic, heart rate in the 120s even though hasn't taken albuterol here. Patient is being admitted under observation. Her first troponin is negative.  PAST MEDICAL HISTORY:   Past Medical History  Diagnosis Date  . COPD (chronic obstructive pulmonary disease)   . Hypertension   . Anxiety     PAST SURGICAL HISTORY:   Past Surgical History  Procedure Laterality Date  . Abdominal hysterectomy      SOCIAL HISTORY:   Social History  Substance Use Topics  . Smoking status: Current Every Day Smoker -- 0.50 packs/day    Types: Cigarettes  . Smokeless tobacco: Not on file  . Alcohol Use: No    FAMILY HISTORY:   Family History  Problem Relation Age of Onset  . CAD Mother   . CAD Father   . Cancer - Prostate Brother   . Cancer - Lung Brother     DRUG ALLERGIES:  No Known Allergies  REVIEW OF SYSTEMS:   Review of Systems  Constitutional: Negative for fever, chills, weight loss and  malaise/fatigue.  HENT: Negative for ear discharge, ear pain, hearing loss, nosebleeds and tinnitus.   Eyes: Negative for blurred vision, double vision and photophobia.  Respiratory: Negative for cough, hemoptysis, shortness of breath and wheezing.   Cardiovascular: Positive for chest pain. Negative for palpitations, orthopnea and leg swelling.  Gastrointestinal: Positive for nausea. Negative for heartburn, vomiting, abdominal pain, diarrhea, constipation and melena.  Genitourinary: Negative for dysuria, urgency, frequency and hematuria.  Musculoskeletal: Negative for myalgias, back pain and neck pain.  Skin: Negative for rash.  Neurological: Positive for weakness. Negative for dizziness, tingling, tremors, sensory change, speech change, focal weakness and headaches.  Endo/Heme/Allergies: Does not bruise/bleed easily.  Psychiatric/Behavioral: Negative for depression. The patient is nervous/anxious.     MEDICATIONS AT HOME:   Prior to Admission medications   Medication Sig Start Date End Date Taking? Authorizing Provider  albuterol (PROAIR HFA) 108 (90 BASE) MCG/ACT inhaler Inhale 2 puffs into the lungs every 6 (six) hours as needed.   Yes Historical Provider, MD  aspirin EC 81 MG tablet Take 1 tablet by mouth daily.   Yes Historical Provider, MD  Fluticasone-Salmeterol (ADVAIR DISKUS) 500-50 MCG/DOSE AEPB Inhale 1 Dose into the lungs daily.   Yes Historical Provider, MD  ipratropium-albuterol (DUONEB) 0.5-2.5 (3) MG/3ML SOLN Inhale 3 mLs into the lungs 4 (four) times daily.   Yes Historical Provider, MD  predniSONE (DELTASONE) 20 MG tablet Take 2 tablets (40  mg total) by mouth daily. 09/15/15 09/14/16 Yes Phineas Semen, MD  tiotropium (SPIRIVA) 18 MCG inhalation capsule Place 1 capsule into inhaler and inhale daily.   Yes Historical Provider, MD  hydrochlorothiazide (MICROZIDE) 12.5 MG capsule Take 1 capsule by mouth daily.    Historical Provider, MD  lisinopril (PRINIVIL,ZESTRIL) 10 MG  tablet Take 1 tablet by mouth daily.    Historical Provider, MD      VITAL SIGNS:  Blood pressure 158/90, pulse 110, temperature 99.4 F (37.4 C), temperature source Oral, resp. rate 16, height  (1.575 m), weight 34.02 kg (75 lb), SpO2 96 %.  PHYSICAL EXAMINATION:   Physical Exam  GENERAL:  59 y.o.-year-old patient lying in the bed with no acute distress.  EYES: Pupils equal, round, reactive to light and accommodation. No scleral icterus. Extraocular muscles intact.  HEENT: Head atraumatic, normocephalic. Oropharynx and nasopharynx clear.  NECK:  Supple, no jugular venous distention. No thyroid enlargement, no tenderness.  LUNGS: Normal breath sounds bilaterally, no wheezing, rales,rhonchi or crepitation. No use of accessory muscles of respiration.  CARDIOVASCULAR: S1, S2 normal. No murmurs, rubs, or gallops.  ABDOMEN: Soft, nontender, nondistended. Bowel sounds present. No organomegaly or mass.  EXTREMITIES: No pedal edema, cyanosis, or clubbing.  NEUROLOGIC: Cranial nerves II through XII are intact. Muscle strength 5/5 in all extremities. Sensation intact. Gait not checked.  PSYCHIATRIC: The patient is alert and oriented x 3.  SKIN: No obvious rash, lesion, or ulcer.   LABORATORY PANEL:   CBC  Recent Labs Lab 09/22/15 1212  WBC 18.7*  HGB 15.2  HCT 45.7  PLT 304   ------------------------------------------------------------------------------------------------------------------  Chemistries   Recent Labs Lab 09/22/15 1212  NA 136  K 3.5  CL 98*  CO2 29  GLUCOSE 125*  BUN 8  CREATININE 0.62  CALCIUM 9.1   ------------------------------------------------------------------------------------------------------------------  Cardiac Enzymes  Recent Labs Lab 09/22/15 1212  TROPONINI <0.03   ------------------------------------------------------------------------------------------------------------------  RADIOLOGY:  Ct Angio Chest Pe W/cm &/or Wo  Cm  09/22/2015   CLINICAL DATA:  Left-sided chest pain  EXAM: CT ANGIOGRAPHY CHEST WITH CONTRAST  TECHNIQUE: Multidetector CT imaging of the chest was performed using the standard protocol during bolus administration of intravenous contrast. Multiplanar CT image reconstructions and MIPs were obtained to evaluate the vascular anatomy.  CONTRAST:  75mL OMNIPAQUE IOHEXOL 350 MG/ML SOLN  COMPARISON:  Chest radiograph September 22, 2015  FINDINGS: There is no demonstrable pulmonary embolus. There is prominence of the central main pulmonary arteries with rather rapid peripheral tapering suggesting a degree of underlying pulmonary arterial hypertension. There is no thoracic aortic aneurysm or dissection. The visualized great vessels appear unremarkable.  There is extensive centrilobular emphysematous change throughout the lungs, most pronounced in the upper lobe regions bilaterally. There is lobular septal thickening in the upper lobes bilaterally, more severe on the left than on the right, probably due to a combination of scarring and chronic mucous plugging. There is honeycombing in the periphery of the left upper lobe suggesting end-stage fibrosis in much of the left upper lobe. There is upper lobe bronchiectasis with a somewhat lesser degree of lower lobe bronchiectatic change. There is no lung edema or consolidation.  There is slight inhomogeneity in the thyroid without dominant mass. There is no appreciable thoracic adenopathy. Pericardium is not thickened.  In the visualized upper abdomen, there is a degree of hepatic steatosis. There is atherosclerotic change in the upper abdominal aorta.  There are no blastic or lytic bone lesions.  Review of the  MIP images confirms the above findings.  IMPRESSION: No demonstrable pulmonary embolus.  Emphysema with pulmonary arterial hypertension.  Areas of bronchiectatic change bilaterally, most notably in the upper lobes bilaterally.  Evidence of fibrotic type change in much of  the left upper lobe peripherally. Areas of lobular septal thickening throughout the left upper lobe are probably due to scarring and mild peripheral mucous plugging, chronic appearing. No lung edema or consolidation appreciable.  No appreciable thoracic adenopathy.   Electronically Signed   By: Bretta Bang III M.D.   On: 09/22/2015 14:46   Dg Chest Portable 1 View  09/22/2015   CLINICAL DATA:  Left-sided chest pain.  History of emphysema.  EXAM: PORTABLE CHEST 1 VIEW  COMPARISON:  09/15/2015  FINDINGS: The heart is normal in size and stable. The mediastinal and hilar contours are unchanged. Mild elevation of both pulmonary hila due to retraction from apical lung disease. There are advanced emphysematous changes and pulmonary scarring most notable in the left upper lobe. No acute overlying pulmonary process is identified. No pleural effusion. Bilateral nipple shadows are noted.  IMPRESSION: Stable advanced emphysematous changes and pulmonary scarring without definite acute overlying pulmonary process.   Electronically Signed   By: Rudie Meyer M.D.   On: 09/22/2015 13:00    EKG:   Orders placed or performed during the hospital encounter of 09/22/15  . EKG 12-Lead  . EKG 12-Lead  . ED EKG within 10 minutes  . ED EKG within 10 minutes    IMPRESSION AND PLAN:   Ashley [name]  is a 59 y.o. female with a known history of COPD not on any home oxygen, anxiety and hypertension presents to the hospital secondary to chest pain that started yesterday.  #1 chest pain-likely pleuritic chest pain. No prior cardiac history. -Admit to telemetry, recycle troponins. -Echocardiogram. -CT of the chest negative for any pulmonary embolus. She has significant emphysema and scarring noted on most of her left side. -No evidence of any bronchitis or pneumonia, so we'll hold off on antibiotics at this time. -Pain medications when necessary.  #2 leukocytosis-likely secondary to recent use of steroids  #3  anxiety-started on Xanax while in the hospital  #4 chronic COPD-stable at this time. Just finished her prednisone taper. -Continue her inhalers and also when necessary nebulizer treatments. -No indication for systemic steroids again.  #5 hypertension-with sinus tachycardia as well.  - Supposed to be on lisinopril hydrochlorothiazide at home, not taking this medications according to patient. -Blood pressures is elevated, so restart medications  #6 DVT prophylaxis-on Lovenox  #7 tobacco use disorder-counseled against smoking for 3 minutes. Started on nicotine patch    All the records are reviewed and case discussed with ED provider. Management plans discussed with the patient, family and they are in agreement.  CODE STATUS: Full Code  TOTAL TIME TAKING CARE OF THIS PATIENT: 50 minutes.    Enid Baas M.D on 09/22/2015 at 3:47 PM  Between 7am to 6pm - Pager - [phone]  After 6pm go to [email or web address] - password [email or web address] [facility]  [facility]  Office  [phone]  CC: Primary care physician; Suan Halter, MD

## 2015-09-22 NOTE — ED Notes (Signed)
Report given to Lauryn C.  

## 2015-09-22 NOTE — ED Notes (Signed)
Pt returned from CT and assisted to the bathroom.

## 2015-09-22 NOTE — ED Notes (Signed)
Pt seen here last week for bronchitis and COPD exacerbation. Pt states chest pain to L side of her ribs began last night, denies trauma or injury. Pt states the pain is like before when she was dx with pleurisy.

## 2015-09-22 NOTE — ED Provider Notes (Signed)
[facility] Emergency Department Provider Note  ____________________________________________  Time seen: On arrival  I have reviewed the triage vital signs and the nursing notes.   HISTORY  Chief Complaint Chest Pain    HPI Ashley [name] is a 59 y.o. female who presents with complaints of left sided chest pain. She reports this pain began last night and is mild to moderate. The pain is pleuritic in nature. She denies fevers chills. She has a history of COPD. She denies recent travel. No leg swelling or calf pain. No nausea no vomiting. She is never had this pain before.     Past Medical History  Diagnosis Date  . COPD (chronic obstructive pulmonary disease)   . Hypertension   . Anxiety     There are no active problems to display for this patient.   Past Surgical History  Procedure Laterality Date  . Abdominal hysterectomy      Current Outpatient Rx  Name  Route  Sig  Dispense  Refill  . albuterol (PROAIR HFA) 108 (90 BASE) MCG/ACT inhaler   Inhalation   Inhale 2 puffs into the lungs every 6 (six) hours as needed.         Marland Kitchen aspirin EC 81 MG tablet   Oral   Take 1 tablet by mouth daily.         . Fluticasone-Salmeterol (ADVAIR DISKUS) 500-50 MCG/DOSE AEPB   Inhalation   Inhale 1 Dose into the lungs daily.         Marland Kitchen ipratropium-albuterol (DUONEB) 0.5-2.5 (3) MG/3ML SOLN   Inhalation   Inhale 3 mLs into the lungs 4 (four) times daily.         . predniSONE (DELTASONE) 20 MG tablet   Oral   Take 2 tablets (40 mg total) by mouth daily.   8 tablet   0   . tiotropium (SPIRIVA) 18 MCG inhalation capsule   Inhalation   Place 1 capsule into inhaler and inhale daily.         . hydrochlorothiazide (MICROZIDE) 12.5 MG capsule   Oral   Take 1 capsule by mouth daily.         Marland Kitchen lisinopril (PRINIVIL,ZESTRIL) 10 MG tablet   Oral   Take 1 tablet by mouth daily.           Allergies Review of patient's allergies indicates no  known allergies.  History reviewed. No pertinent family history.  Social History Social History  Substance Use Topics  . Smoking status: Current Every Day Smoker -- 0.50 packs/day    Types: Cigarettes  . Smokeless tobacco: None  . Alcohol Use: No    Review of Systems  Constitutional: Negative for fever. Eyes: Negative for visual changes. ENT: Negative for sore throat Cardiovascular: Positive for chest pain Respiratory: Negative for shortness of breath. Gastrointestinal: Negative for abdominal pain, vomiting and diarrhea. Genitourinary: Negative for dysuria. Musculoskeletal: Negative for back pain. Skin: Negative for rash. Neurological: Negative for headaches or focal weakness Psychiatric: Anxiety    ____________________________________________   PHYSICAL EXAM:  VITAL SIGNS: ED Triage Vitals  Enc Vitals Group     BP 09/22/15 1206 171/107 mmHg     Pulse Rate 09/22/15 1206 118     Resp 09/22/15 1206 22     Temp 09/22/15 1206 99.4 F (37.4 C)     Temp Source 09/22/15 1206 Oral     SpO2 09/22/15 1206 98 %     Weight 09/22/15 1206 75 lb (34.02 kg)  Height 09/22/15 1206  (1.575 m)     Head Cir --      Peak Flow --      Pain Score 09/22/15 1208 4     Pain Loc --      Pain Edu? --      Excl. in GC? --      Constitutional: Alert and oriented. Cachectic, chronically ill-appearing Eyes: Conjunctivae are normal.  ENT   Head: Normocephalic and atraumatic.   Mouth/Throat: Mucous membranes are moist. Cardiovascular: Tachycardic, regular rhythm. Normal and symmetric distal pulses are present in all extremities. No murmurs, rubs, or gallops. Respiratory: Normal respiratory effort without tachypnea nor retractions. Breath sounds are clear and equal bilaterally.  Gastrointestinal: Soft and non-tender in all quadrants. No distention. There is no CVA tenderness. Genitourinary: deferred Musculoskeletal: Nontender with normal range of motion in all extremities.  No lower extremity tenderness nor edema. Neurologic:  Normal speech and language. No gross focal neurologic deficits are appreciated. Skin:  Skin is warm, dry and intact. No rash noted. Psychiatric: Mood and affect are normal. Patient exhibits appropriate insight and judgment.  ____________________________________________    LABS (pertinent positives/negatives)  Labs Reviewed  BASIC METABOLIC PANEL - Abnormal; Notable for the following:    Chloride 98 (*)    Glucose, Bld 125 (*)    All other components within normal limits  CBC - Abnormal; Notable for the following:    WBC 18.7 (*)    All other components within normal limits  TROPONIN I    ____________________________________________   EKG ED ECG REPORT I, Jene Every, the attending physician, personally viewed and interpreted this ECG.  Date: 09/22/2015 EKG Time: 12:08 PM Rate: 116 Rhythm: Sinus tachycardia rhythm QRS Axis: normal Intervals: normal ST/T Wave abnormalities: normal Conduction Disutrbances: none Narrative Interpretation: unremarkable   ____________________________________________    RADIOLOGY I have personally reviewed any xrays that were ordered on this patient: Chest x-ray unremarkable  ____________________________________________   PROCEDURES  Procedure(s) performed: none  Critical Care performed: none  ____________________________________________   INITIAL IMPRESSION / ASSESSMENT AND PLAN / ED COURSE  Pertinent labs & imaging results that were available during my care of the patient were reviewed by me and considered in my medical decision making (see chart for details).  Patient with concerning presentation of tachycardia, elevated white blood cell count and chest discomfort. Given her pleurisy I have a high suspicion for pulmonary embolus. We will obtain CT angiogram, give fluids and reevaluate  ----------------------------------------- 3:12 PM on  09/22/2015 -----------------------------------------  CT angiogram shows no PE. Patient continues to be significantly tachycardic after fluids and continues to have chest pain. I will admit the patient for observation  ____________________________________________   FINAL CLINICAL IMPRESSION(S) / ED DIAGNOSES  Final diagnoses:  Chest pain, unspecified chest pain type     Jene Every, MD 09/22/15 1527

## 2015-09-23 ENCOUNTER — Observation Stay
Admit: 2015-09-23 | Discharge: 2015-09-23 | Disposition: A | Discharge status: Home / Self Care | Payer: Medicaid Other | Attending: Internal Medicine | Admitting: Internal Medicine

## 2015-09-23 DIAGNOSIS — R079 Chest pain, unspecified: Secondary | ICD-10-CM

## 2015-09-23 LAB — CBC
HEMATOCRIT: 38.3 % (ref 35.0–47.0)
HEMOGLOBIN: 13 g/dL (ref 12.0–16.0)
MCH: 30.4 pg (ref 26.0–34.0)
MCHC: 33.9 g/dL (ref 32.0–36.0)
MCV: 89.7 fL (ref 80.0–100.0)
Platelets: 253 10*3/uL (ref 150–440)
RBC: 4.27 MIL/uL (ref 3.80–5.20)
RDW: 12.4 % (ref 11.5–14.5)
WBC: 16 10*3/uL — ABNORMAL HIGH (ref 3.6–11.0)

## 2015-09-23 LAB — LIPID PANEL
Cholesterol: 123 mg/dL (ref 0–200)
HDL: 46 mg/dL (ref >40)
LDL CALC: 68 mg/dL (ref 0–99)
TRIGLYCERIDES: 43 mg/dL (ref <150)
Total CHOL/HDL Ratio: 2.7 RATIO
VLDL: 9 mg/dL (ref 0–40)

## 2015-09-23 LAB — BASIC METABOLIC PANEL
ANION GAP: 6 (ref 5–15)
BUN: 11 mg/dL (ref 6–20)
CO2: 28 mmol/L (ref 22–32)
Calcium: 8.3 mg/dL — ABNORMAL LOW (ref 8.9–10.3)
Chloride: 105 mmol/L (ref 101–111)
Creatinine, Ser: 0.56 mg/dL (ref 0.44–1.00)
GFR calc Af Amer: >60 mL/min (ref >60)
GLUCOSE: 126 mg/dL — AB (ref 65–99)
POTASSIUM: 3.3 mmol/L — AB (ref 3.5–5.1)
Sodium: 139 mmol/L (ref 135–145)

## 2015-09-23 LAB — TROPONIN I: Troponin I: 0.03 ng/mL (ref <0.031)

## 2015-09-23 LAB — GLUCOSE, CAPILLARY: GLUCOSE-CAPILLARY: 125 mg/dL — AB (ref 65–99)

## 2015-09-23 MED ORDER — PNEUMOCOCCAL VAC POLYVALENT 25 MCG/0.5ML IJ INJ: 0.5000 mL | INJECTION | INTRAMUSCULAR | Status: DC

## 2015-09-23 MED ORDER — SODIUM CHLORIDE 0.9 % IV BOLUS (SEPSIS)
1000.0000 mL | Freq: Once | INTRAVENOUS | Status: AC
  Administered 2015-09-23: 1000 mL via INTRAVENOUS

## 2015-09-23 MED ORDER — POTASSIUM CHLORIDE CRYS ER 20 MEQ PO TBCR
40.0000 meq | EXTENDED_RELEASE_TABLET | Freq: Once | ORAL | Status: AC
  Administered 2015-09-23: 40 meq via ORAL
  Filled 2015-09-23: qty 2

## 2015-09-23 MED ORDER — ENOXAPARIN SODIUM 30 MG/0.3ML ~~LOC~~ SOLN
30.0000 mg | SUBCUTANEOUS | Status: DC
  Administered 2015-09-23: 30 mg via SUBCUTANEOUS
  Filled 2015-09-23: qty 0.3

## 2015-09-23 MED ORDER — ENSURE ENLIVE PO LIQD
237.0000 mL | Freq: Three times a day (TID) | ORAL | Status: DC
  Administered 2015-09-23 – 2015-09-24 (×2): 237 mL via ORAL

## 2015-09-23 MED ORDER — AZITHROMYCIN 500 MG IV SOLR
500.0000 mg | INTRAVENOUS | Status: DC
Start: 2015-09-23 — End: 2015-09-24
  Administered 2015-09-23: 500 mg via INTRAVENOUS
  Filled 2015-09-23 (×2): qty 500

## 2015-09-23 MED ORDER — KETOROLAC TROMETHAMINE 15 MG/ML IJ SOLN
15.0000 mg | Freq: Three times a day (TID) | INTRAMUSCULAR | Status: DC
  Administered 2015-09-23 – 2015-09-24 (×3): 15 mg via INTRAVENOUS
  Filled 2015-09-23 (×5): qty 1

## 2015-09-23 NOTE — Progress Notes (Signed)
   09/23/15 0945  Clinical Encounter Type  Visited With Patient  Visit Type Follow-up  Referral From Chaplain  Consult/Referral To Chaplain  Spiritual Encounters  Spiritual Needs Emotional  Stress Factors  Patient Stress Factors Exhausted;Financial concerns;Health changes  Followed up w/patient after last night rapid response. Patient is concerned about the ability to continue treatment due to problems with Medicare. Request Social Work follow up to access options.  Chap. Ilyana Manuele G. Kushi Kun, ext. [phone]

## 2015-09-23 NOTE — Progress Notes (Signed)
   09/23/15 0221  Clinical Encounter Type  Visited With Patient  Visit Type Initial  Referral From Nurse  Consult/Referral To Chaplain  Responded to Rapid Response page.  Patient was slow to respond and told nursing staff she was ok but felt "sick".  Nursing staff administered medication to pt.  No family present.  Pastoral presence and support provided to patient and staff.  [employer] Onie Hayashi-pager [phone]

## 2015-09-23 NOTE — Progress Notes (Signed)
[facility] - [place] [facility]   PATIENT NAME: Ashley [name]    MR#:  [id number]  DATE OF BIRTH:  1956/11/17  SUBJECTIVE:  CHIEF COMPLAINT: Patient is reporting left-sided rib pain with deep inspirations. Denies any  sick contacts   REVIEW OF SYSTEMS:  CONSTITUTIONAL: No fever, fatigue or weakness.  EYES: No blurred or double vision.  EARS, NOSE, AND THROAT: No tinnitus or ear pain.  RESPIRATORY: No cough, shortness of breath, wheezing or hemoptysis.  CARDIOVASCULAR: Left pleuritic chest pain with deep inspirations, orthopnea, edema.  GASTROINTESTINAL: No nausea, vomiting, diarrhea or abdominal pain.  GENITOURINARY: No dysuria, hematuria.  ENDOCRINE: No polyuria, nocturia,  HEMATOLOGY: No anemia, easy bruising or bleeding SKIN: No rash or lesion. MUSCULOSKELETAL: No joint pain or arthritis.   NEUROLOGIC: No tingling, numbness, weakness.  PSYCHIATRY: No anxiety or depression.   DRUG ALLERGIES:  No Known Allergies  VITALS:  Blood pressure 96/54, pulse 97, temperature 98.3 F (36.8 C), temperature source Oral, resp. rate 16, height  (1.575 m), weight 32.614 kg (71 lb 14.4 oz), SpO2 95 %.  PHYSICAL EXAMINATION:  GENERAL:  59 y.o.-year-old patient lying in the bed with no acute distress.  EYES: Pupils equal, round, reactive to light and accommodation. No scleral icterus. Extraocular muscles intact.  HEENT: Head atraumatic, normocephalic. Oropharynx and nasopharynx clear.  NECK:  Supple, no jugular venous distention. No thyroid enlargement, no tenderness.  LUNGS: Normal breath sounds bilaterally, no wheezing, rales,rhonchi or crepitation. No use of accessory muscles of respiration.  CARDIOVASCULAR: Left pleuritic chest pain with deep inspiration, reproducible S1, S2 normal. No murmurs, rubs, or gallops.  ABDOMEN: Soft, nontender, nondistended. Bowel sounds present. No organomegaly or mass.  EXTREMITIES: No pedal edema, cyanosis, or clubbing.   NEUROLOGIC: Cranial nerves II through XII are intact. Muscle strength 5/5 in all extremities. Sensation intact. Gait not checked.  PSYCHIATRIC: The patient is alert and oriented x 3.  SKIN: No obvious rash, lesion, or ulcer.    LABORATORY PANEL:   CBC  Recent Labs Lab 09/23/15 0541  WBC 16.0*  HGB 13.0  HCT 38.3  PLT 253   ------------------------------------------------------------------------------------------------------------------  Chemistries   Recent Labs Lab 09/23/15 0541  NA 139  K 3.3*  CL 105  CO2 28  GLUCOSE 126*  BUN 11  CREATININE 0.56  CALCIUM 8.3*   ------------------------------------------------------------------------------------------------------------------  Cardiac Enzymes  Recent Labs Lab 09/23/15 0541  TROPONINI <0.03   ------------------------------------------------------------------------------------------------------------------  RADIOLOGY:  Ct Angio Chest Pe W/cm &/or Wo Cm  09/22/2015   CLINICAL DATA:  Left-sided chest pain  EXAM: CT ANGIOGRAPHY CHEST WITH CONTRAST  TECHNIQUE: Multidetector CT imaging of the chest was performed using the standard protocol during bolus administration of intravenous contrast. Multiplanar CT image reconstructions and MIPs were obtained to evaluate the vascular anatomy.  CONTRAST:  75mL OMNIPAQUE IOHEXOL 350 MG/ML SOLN  COMPARISON:  Chest radiograph September 22, 2015  FINDINGS: There is no demonstrable pulmonary embolus. There is prominence of the central main pulmonary arteries with rather rapid peripheral tapering suggesting a degree of underlying pulmonary arterial hypertension. There is no thoracic aortic aneurysm or dissection. The visualized great vessels appear unremarkable.  There is extensive centrilobular emphysematous change throughout the lungs, most pronounced in the upper lobe regions bilaterally. There is lobular septal thickening in the upper lobes bilaterally, more severe on the left than on  the right, probably due to a combination of scarring and chronic mucous plugging. There is honeycombing in the periphery of the left upper  lobe suggesting end-stage fibrosis in much of the left upper lobe. There is upper lobe bronchiectasis with a somewhat lesser degree of lower lobe bronchiectatic change. There is no lung edema or consolidation.  There is slight inhomogeneity in the thyroid without dominant mass. There is no appreciable thoracic adenopathy. Pericardium is not thickened.  In the visualized upper abdomen, there is a degree of hepatic steatosis. There is atherosclerotic change in the upper abdominal aorta.  There are no blastic or lytic bone lesions.  Review of the MIP images confirms the above findings.  IMPRESSION: No demonstrable pulmonary embolus.  Emphysema with pulmonary arterial hypertension.  Areas of bronchiectatic change bilaterally, most notably in the upper lobes bilaterally.  Evidence of fibrotic type change in much of the left upper lobe peripherally. Areas of lobular septal thickening throughout the left upper lobe are probably due to scarring and mild peripheral mucous plugging, chronic appearing. No lung edema or consolidation appreciable.  No appreciable thoracic adenopathy.   Electronically Signed   By: Bretta Bang III M.D.   On: 09/22/2015 14:46   Dg Chest Portable 1 View  09/22/2015   CLINICAL DATA:  Left-sided chest pain.  History of emphysema.  EXAM: PORTABLE CHEST 1 VIEW  COMPARISON:  09/15/2015  FINDINGS: The heart is normal in size and stable. The mediastinal and hilar contours are unchanged. Mild elevation of both pulmonary hila due to retraction from apical lung disease. There are advanced emphysematous changes and pulmonary scarring most notable in the left upper lobe. No acute overlying pulmonary process is identified. No pleural effusion. Bilateral nipple shadows are noted.  IMPRESSION: Stable advanced emphysematous changes and pulmonary scarring without  definite acute overlying pulmonary process.   Electronically Signed   By: Rudie Meyer M.D.   On: 09/22/2015 13:00    EKG:   Orders placed or performed during the hospital encounter of 09/22/15  . EKG 12-Lead  . EKG 12-Lead  . ED EKG within 10 minutes  . ED EKG within 10 minutes  . EKG 12-Lead  . EKG 12-Lead    ASSESSMENT AND PLAN:   Breon Diss is a 59 y.o. female with a known history of COPD not on any home oxygen, anxiety and hypertension presents to the hospital secondary to chest pain that started yesterday.  #1 chest pain-likely pleuritic chest pain with leukocytosis could be from atypical pneumonia -Started patient on anti-inflammatories Toradol -Empiric antibiotic Azithromycin -Cardiology consult is pending -Ruled out acute MI with 3 negative sets of cardiac enzymes -Echocardiogram pending -CT of the chest negative for any pulmonary embolus. She has significant emphysema and scarring noted on most of her left side. -No evidence of any bronchitis or pneumonia, -Pain medications when necessary.  #2 leukocytosis-likely secondary to #1 / recent use of steroids  #3 anxiety-started on Xanax while in the hospital  #4 chronic COPD-stable at this time. Just finished her prednisone taper. -Continue her inhalers and also when necessary nebulizer treatments. -No indication for systemic steroids again.  #5 hypertension-with sinus tachycardia as well.  - Supposed to be on lisinopril hydrochlorothiazide at home, not taking this medications according to patient. -Blood pressures is elevated, so restart medications  #6 DVT prophylaxis-on Lovenox  #7 tobacco use disorder-counseled against smoking for 3 minutes. Started on nicotine patch     All the records are reviewed and case discussed with Care Management/Social Workerr. Management plans discussed with the patient, family and they are in agreement.  CODE STATUS: full code  TOTAL TIME TAKING CARE  OF THIS PATIENT: 35   minutes.   POSSIBLE D/C IN am DAYS, DEPENDING ON CLINICAL CONDITION.   Ramonita Lab M.D on 09/23/2015 at 4:07 PM  Between 7am to 6pm - Pager - [phone] After 6pm go to [email or web address] - password [email or web address] [facility]  [place] [facility]  Office  [phone]  CC: Primary care physician; Suan Halter, MD

## 2015-09-23 NOTE — Progress Notes (Signed)
Pt called this nurse in the room saying she didn't feel well, when this nurse got into the room the pt stated the room was turning black and it felt like she was going to pass out. Pt was sweaty. Got a blood sugar- 125. Got a set of vitals BP was 57/25. pt was starting to become unresponsive. Called a rapid response. MD paged. Started a bolus of normal saline & got a stat EKG. Dr. Anne Hahn came up to see pt. BP came back up & pt become more arousable, talking to [place], completed a set of neuro checks, with no deficits. Pt stated she felt sick to her stomach so gave some zofran. Pt is not resting in bed. No other complaints. Will continue to monitor. Shirley Friar, RN

## 2015-09-23 NOTE — Progress Notes (Signed)
Initial Nutrition Assessment  DOCUMENTATION CODES:   Severe malnutrition in context of chronic illness  INTERVENTION:   Meals and Snacks: Cater to patient preferences Medical Food Supplement Therapy: will recommend Ensure Enlive po TID, each supplement provides 350 kcal and 20 grams of protein Education: RD provided "High-Calorie High-Protein Nutrition Therapy" handout from the Academy of [employer] and [employer]etics. Reviewed patient's dietary recall. Provided examples on ways to increase caloric density of foods and beverages frequently consumed by the patient. Also provided ideas to promote variety and to incorporate additional nutrient dense foods into patient's diet. Discussed eating small frequent meals and snacks to assist in increasing overall po intake. Teach back method used. Expect good compliance.   NUTRITION DIAGNOSIS:   Malnutrition related to chronic illness as evidenced by percent weight loss, energy intake < or equal to 75% for > or equal to 1 month, severe depletion of muscle mass, severe depletion of body fat.  GOAL:   Patient will meet greater than or equal to 90% of their needs  MONITOR:    (Energy Intake, Electrolyte and Renal Profile, Anthropometrics, Digestive system)  REASON FOR ASSESSMENT:    (Low BMI)    ASSESSMENT:   Pt admitted with chest pain s/p ECHO. Per Nsg note, pt with rapid response last night with low BP.   Past Medical History  Diagnosis Date  . COPD (chronic obstructive pulmonary disease)   . Hypertension   . Anxiety     Diet Order:  Diet Heart Room service appropriate?: Yes; Fluid consistency:: Thin    Current Nutrition: Pt reports eating 'a little bit' of lunch today.  Food/Nutrition-Related History: Pt reports eating very small amounts of food multiple times a day. Pt reports back in March having a stomach bug that lasted 3 months where she lost weight and could not keep foods down.  Since then pt has had difficulty eating enough to  gain weight back. Pt does report drinking Ensure PTA.    Medications: KCl, Zofran  Electrolyte/Renal Profile and Glucose Profile:   Recent Labs Lab 09/22/15 1212 09/23/15 0541  NA 136 139  K 3.5 3.3*  CL 98* 105  CO2 29 28  BUN 8 11  CREATININE 0.62 0.56  CALCIUM 9.1 8.3*  GLUCOSE 125* 126*   Protein Profile: No results for input(s): ALBUMIN in the last 168 hours.  Gastrointestinal Profile: Last BM:  09/22/2015   Nutrition-Focused Physical Exam Findings: Nutrition-Focused physical exam completed. Findings are severe fat depletion, severe muscle depletion, and no edema.    Weight Change: Pt reports weight os 89lbs in March (20% weight loss in 7 months)   Skin:  Reviewed, no issues   Height:   Ht Readings from Last 1 Encounters:  09/22/15  (1.575 m)    Weight:   Wt Readings from Last 1 Encounters:  09/22/15 71 lb 14.4 oz (32.614 kg)    Wt Readings from Last 10 Encounters:  09/22/15 71 lb 14.4 oz (32.614 kg)  07/31/15 75 lb (34.02 kg)    Ideal Body Weight:   50kg  BMI:  Body mass index is 13.15 kg/(m^2).  Estimated Nutritional Needs:   Kcal:  using IBW of 50kg, BEE: 1066kcals, TEE: (IF 1.2-1.4)(AF 1.2) 1535-1790kcals  Protein:  50-60g protein (1.0-1.2g/kg) using IBW of 50kg  Fluid:  1250-1582mL of fluid (25-67mL/kg) using IBw of 50kg  EDUCATION NEEDS:   Education needs addressed   HIGH Care Level  Leda Quail, RD, LDN Pager [phone]

## 2015-09-23 NOTE — Progress Notes (Signed)
*  PRELIMINARY RESULTS* Echocardiogram 2D Echocardiogram has been performed.  Ashley [name] 09/23/2015, 2:10 PM

## 2015-09-24 LAB — CBC
HEMATOCRIT: 37.1 % (ref 35.0–47.0)
HEMOGLOBIN: 12.4 g/dL (ref 12.0–16.0)
MCH: 30.2 pg (ref 26.0–34.0)
MCHC: 33.4 g/dL (ref 32.0–36.0)
MCV: 90.3 fL (ref 80.0–100.0)
Platelets: 258 10*3/uL (ref 150–440)
RBC: 4.11 MIL/uL (ref 3.80–5.20)
RDW: 12.6 % (ref 11.5–14.5)
WBC: 9.4 10*3/uL (ref 3.6–11.0)

## 2015-09-24 LAB — BASIC METABOLIC PANEL
Anion gap: 6 (ref 5–15)
BUN: 12 mg/dL (ref 6–20)
CHLORIDE: 105 mmol/L (ref 101–111)
CO2: 30 mmol/L (ref 22–32)
CREATININE: 0.51 mg/dL (ref 0.44–1.00)
Calcium: 8.4 mg/dL — ABNORMAL LOW (ref 8.9–10.3)
GFR calc non Af Amer: >60 mL/min (ref >60)
Glucose, Bld: 89 mg/dL (ref 65–99)
POTASSIUM: 3.7 mmol/L (ref 3.5–5.1)
Sodium: 141 mmol/L (ref 135–145)

## 2015-09-24 MED ORDER — AZITHROMYCIN 250 MG PO TABS
250.0000 mg | ORAL_TABLET | Freq: Every day | ORAL | Status: DC
  Administered 2015-09-24: 250 mg via ORAL
  Filled 2015-09-24: qty 1

## 2015-09-24 MED ORDER — OXYCODONE HCL 5 MG PO TABS: 5.0000 mg | ORAL_TABLET | Freq: Four times a day (QID) | ORAL | Status: DC | PRN

## 2015-09-24 MED ORDER — AZITHROMYCIN 250 MG PO TABS: ORAL_TABLET | ORAL | Status: DC

## 2015-09-24 NOTE — Progress Notes (Signed)
A & O. No pain. Takes meds ok. IV and tele removed. Prescriptions given to pt. Discharge instructions given to pt. Brother at bedside to take pt home. Pt has no further concerns at this time.

## 2015-09-24 NOTE — Progress Notes (Signed)
A&O. Up with one assist. BP has been stable but low with the systolic BP in the 90s. No complaints.

## 2015-09-24 NOTE — Discharge Summary (Signed)
[facility] -  at [facility]   PATIENT NAME: Ashley [name]    MR#:  [id number]  DATE OF BIRTH:  04-03-1956  DATE OF ADMISSION:  09/22/2015 ADMITTING PHYSICIAN: Enid Baas, MD  DATE OF DISCHARGE:   PRIMARY CARE PHYSICIAN: Suan Halter, MD    ADMISSION DIAGNOSIS:  Chest pain, unspecified chest pain type [R07.9]  DISCHARGE DIAGNOSIS:  Left sided pleuritic chest pain suspected due to broncheictasis (likley chronic) Severe Emphysema/COPD Ongoing tobacco abuse  SECONDARY DIAGNOSIS:   Past Medical History  Diagnosis Date  . COPD (chronic obstructive pulmonary disease)   . Hypertension   . Anxiety     HOSPITAL COURSE:  Ashley [name] is a 58 y.o. female with a known history of COPD not on any home oxygen, anxiety and hypertension presents to the hospital secondary to chest pain that started yesterday.  #1 chest pain-likely pleuritic chest pain with leukocytosis could be from acute on crhonic bronchiectasis --Empiric antibiotic Azithromycin -Cardiology consult not needed at present -pt feels better with abxs and po pain meds. Vitals stable. CE x 4 negative -Ruled out acute MI with 4 negative sets of cardiac enzymes -CT of the chest negative for any pulmonary embolus. She has significant emphysema and scarring noted on most of her left side. -No evidence of any bronchitis or pneumonia -Pain medications when necessary. -wbc 18K-->9K  #2 leukocytosis-likely secondary to #1 / recent use of steroids  #3 anxiety- on Xanax while in the hospital  #4 chronic COPD-stable at this time. Just finished her prednisone taper. -Continue her inhalers and also when necessary nebulizer treatments. -No indication for systemic steroids again.  #5 hypertension-with sinus tachycardia as well.  - Supposed to be on lisinopril hydrochlorothiazide at home, not taking this medications according to patient. -Blood pressures is elevated, so restart  medications  #6 DVT prophylaxis-on Lovenox  #7 tobacco use disorder-counseled against smoking for 3 minutes.   #8 Severe protein-calorie malnutiriton Follow dietitian recommendations  Overall stable D/c home CONSULTS OBTAINED:  none  DRUG ALLERGIES:  No Known Allergies  DISCHARGE MEDICATIONS:   Current Discharge Medication List    START taking these medications   Details  azithromycin (ZITHROMAX) 250 MG tablet Take as directed Qty: 6 each, Refills: 0    oxyCODONE (OXY IR/ROXICODONE) 5 MG immediate release tablet Take 1 tablet (5 mg total) by mouth every 6 (six) hours as needed for moderate pain. Qty: 25 tablet, Refills: 0      CONTINUE these medications which have NOT CHANGED   Details  albuterol (PROAIR HFA) 108 (90 BASE) MCG/ACT inhaler Inhale 2 puffs into the lungs every 6 (six) hours as needed.    aspirin EC 81 MG tablet Take 1 tablet by mouth daily.    Fluticasone-Salmeterol (ADVAIR DISKUS) 500-50 MCG/DOSE AEPB Inhale 1 Dose into the lungs daily.    ipratropium-albuterol (DUONEB) 0.5-2.5 (3) MG/3ML SOLN Inhale 3 mLs into the lungs 4 (four) times daily.    tiotropium (SPIRIVA) 18 MCG inhalation capsule Place 1 capsule into inhaler and inhale daily.    hydrochlorothiazide (MICROZIDE) 12.5 MG capsule Take 1 capsule by mouth daily.    lisinopril (PRINIVIL,ZESTRIL) 10 MG tablet Take 1 tablet by mouth daily.      STOP taking these medications     predniSONE (DELTASONE) 20 MG tablet         If you experience worsening of your admission symptoms, develop shortness of breath, life threatening emergency, suicidal or homicidal thoughts you must seek medical attention immediately  by calling 911 or calling your MD immediately  if symptoms less severe.  You Must read complete instructions/literature along with all the possible adverse reactions/side effects for all the Medicines you take and that have been prescribed to you. Take any new Medicines after you have  completely understood and accept all the possible adverse reactions/side effects.   Please note  You were cared for by a hospitalist during your hospital stay. If you have any questions about your discharge medications or the care you received while you were in the hospital after you are discharged, you can call the unit and asked to speak with the hospitalist on call if the hospitalist that took care of you is not available. Once you are discharged, your primary care physician will handle any further medical issues. Please note that NO REFILLS for any discharge medications will be authorized once you are discharged, as it is imperative that you return to your primary care physician (or establish a relationship with a primary care physician if you do not have one) for your aftercare needs so that they can reassess your need for medications and monitor your lab values. Today   SUBJECTIVE  Feels better after getting po pain meds. No fever. Breathing ok.   VITAL SIGNS:  Blood pressure 101/57, pulse 89, temperature 99.3 F (37.4 C), temperature source Oral, resp. rate 20, height  (1.575 m), weight 32.614 kg (71 lb 14.4 oz), SpO2 95 %.  I/O:   Intake/Output Summary (Last 24 hours) at 09/24/15 0834 Last data filed at 09/24/15 0100  Gross per 24 hour  Intake      0 ml  Output   1200 ml  Net  -1200 ml    PHYSICAL EXAMINATION:  GENERAL:  59 y.o.-year-old patient lying in the bed with no acute distress. Thin,cahectic EYES: Pupils equal, round, reactive to light and accommodation. No scleral icterus. Extraocular muscles intact.  HEENT: Head atraumatic, normocephalic. Oropharynx and nasopharynx clear.  NECK:  Supple, no jugular venous distention. No thyroid enlargement, no tenderness.  LUNGS: Normal breath sounds bilaterally, no wheezing, rales,rhonchi or crepitation. No use of accessory muscles of respiration.  CARDIOVASCULAR: S1, S2 normal. No murmurs, rubs, or gallops.  ABDOMEN: Soft,  non-tender, non-distended. Bowel sounds present. No organomegaly or mass.  EXTREMITIES: No pedal edema, cyanosis, or clubbing.  NEUROLOGIC: Cranial nerves II through XII are intact. Muscle strength 5/5 in all extremities. Sensation intact. Gait not checked.  PSYCHIATRIC: The patient is alert and oriented x 3.  SKIN: No obvious rash, lesion, or ulcer.   DATA REVIEW:   CBC   Recent Labs Lab 09/24/15 0547  WBC 9.4  HGB 12.4  HCT 37.1  PLT 258    Chemistries   Recent Labs Lab 09/24/15 0547  NA 141  K 3.7  CL 105  CO2 30  GLUCOSE 89  BUN 12  CREATININE 0.51  CALCIUM 8.4*    Microbiology Results   No results found for this or any previous visit (from the past 240 hour(s)).  RADIOLOGY:  Ct Angio Chest Pe W/cm &/or Wo Cm  09/22/2015   CLINICAL DATA:  Left-sided chest pain  EXAM: CT ANGIOGRAPHY CHEST WITH CONTRAST  TECHNIQUE: Multidetector CT imaging of the chest was performed using the standard protocol during bolus administration of intravenous contrast. Multiplanar CT image reconstructions and MIPs were obtained to evaluate the vascular anatomy.  CONTRAST:  75mL OMNIPAQUE IOHEXOL 350 MG/ML SOLN  COMPARISON:  Chest radiograph September 22, 2015  FINDINGS: There  is no demonstrable pulmonary embolus. There is prominence of the central main pulmonary arteries with rather rapid peripheral tapering suggesting a degree of underlying pulmonary arterial hypertension. There is no thoracic aortic aneurysm or dissection. The visualized great vessels appear unremarkable.  There is extensive centrilobular emphysematous change throughout the lungs, most pronounced in the upper lobe regions bilaterally. There is lobular septal thickening in the upper lobes bilaterally, more severe on the left than on the right, probably due to a combination of scarring and chronic mucous plugging. There is honeycombing in the periphery of the left upper lobe suggesting end-stage fibrosis in much of the left upper  lobe. There is upper lobe bronchiectasis with a somewhat lesser degree of lower lobe bronchiectatic change. There is no lung edema or consolidation.  There is slight inhomogeneity in the thyroid without dominant mass. There is no appreciable thoracic adenopathy. Pericardium is not thickened.  In the visualized upper abdomen, there is a degree of hepatic steatosis. There is atherosclerotic change in the upper abdominal aorta.  There are no blastic or lytic bone lesions.  Review of the MIP images confirms the above findings.  IMPRESSION: No demonstrable pulmonary embolus.  Emphysema with pulmonary arterial hypertension.  Areas of bronchiectatic change bilaterally, most notably in the upper lobes bilaterally.  Evidence of fibrotic type change in much of the left upper lobe peripherally. Areas of lobular septal thickening throughout the left upper lobe are probably due to scarring and mild peripheral mucous plugging, chronic appearing. No lung edema or consolidation appreciable.  No appreciable thoracic adenopathy.   Electronically Signed   By: Bretta Bang III M.D.   On: 09/22/2015 14:46   Dg Chest Portable 1 View  09/22/2015   CLINICAL DATA:  Left-sided chest pain.  History of emphysema.  EXAM: PORTABLE CHEST 1 VIEW  COMPARISON:  09/15/2015  FINDINGS: The heart is normal in size and stable. The mediastinal and hilar contours are unchanged. Mild elevation of both pulmonary hila due to retraction from apical lung disease. There are advanced emphysematous changes and pulmonary scarring most notable in the left upper lobe. No acute overlying pulmonary process is identified. No pleural effusion. Bilateral nipple shadows are noted.  IMPRESSION: Stable advanced emphysematous changes and pulmonary scarring without definite acute overlying pulmonary process.   Electronically Signed   By: Rudie Meyer M.D.   On: 09/22/2015 13:00     Management plans discussed with the patient, family and they are in  agreement.  CODE STATUS:     Code Status Orders        Start     Ordered   09/22/15 1748  Full code   Continuous     09/22/15 1747      TOTAL TIME TAKING CARE OF THIS PATIENT: 40 minutes.    Mistie Adney M.D on 09/24/2015 at 8:34 AM  Between 7am to 6pm - Pager - [phone] After 6pm go to [email or web address] - password [email or web address] [facility]  [place] [facility]  Office  [phone]  CC: Primary care physician; Suan Halter, MD

## 2015-09-24 NOTE — Discharge Instructions (Signed)
Stop smoking

## 2015-09-25 ENCOUNTER — Emergency Department
Admission: EM | Admit: 2015-09-25 | Discharge: 2015-09-25 | Disposition: A | Discharge status: Home / Self Care | Payer: Self-pay | Attending: Emergency Medicine | Admitting: Emergency Medicine

## 2015-09-25 ENCOUNTER — Other Ambulatory Visit: Payer: Self-pay

## 2015-09-25 ENCOUNTER — Emergency Department: Payer: Self-pay

## 2015-09-25 DIAGNOSIS — Z720 Tobacco use: Secondary | ICD-10-CM | POA: Insufficient documentation

## 2015-09-25 DIAGNOSIS — R079 Chest pain, unspecified: Secondary | ICD-10-CM | POA: Insufficient documentation

## 2015-09-25 DIAGNOSIS — I10 Essential (primary) hypertension: Secondary | ICD-10-CM | POA: Insufficient documentation

## 2015-09-25 DIAGNOSIS — Z7951 Long term (current) use of inhaled steroids: Secondary | ICD-10-CM | POA: Insufficient documentation

## 2015-09-25 DIAGNOSIS — R091 Pleurisy: Secondary | ICD-10-CM | POA: Insufficient documentation

## 2015-09-25 DIAGNOSIS — Z7982 Long term (current) use of aspirin: Secondary | ICD-10-CM | POA: Insufficient documentation

## 2015-09-25 DIAGNOSIS — Z79899 Other long term (current) drug therapy: Secondary | ICD-10-CM | POA: Insufficient documentation

## 2015-09-25 LAB — COMPREHENSIVE METABOLIC PANEL
ALK PHOS: 80 U/L (ref 38–126)
ALT: 9 U/L — AB (ref 14–54)
ANION GAP: 9 (ref 5–15)
AST: 15 U/L (ref 15–41)
Albumin: 3.3 g/dL — ABNORMAL LOW (ref 3.5–5.0)
BILIRUBIN TOTAL: 0.8 mg/dL (ref 0.3–1.2)
BUN: 8 mg/dL (ref 6–20)
CALCIUM: 8.8 mg/dL — AB (ref 8.9–10.3)
CO2: 27 mmol/L (ref 22–32)
CREATININE: 0.51 mg/dL (ref 0.44–1.00)
Chloride: 99 mmol/L — ABNORMAL LOW (ref 101–111)
Glucose, Bld: 135 mg/dL — ABNORMAL HIGH (ref 65–99)
Potassium: 3.3 mmol/L — ABNORMAL LOW (ref 3.5–5.1)
SODIUM: 135 mmol/L (ref 135–145)
TOTAL PROTEIN: 6.7 g/dL (ref 6.5–8.1)

## 2015-09-25 LAB — CBC
HCT: 41.9 % (ref 35.0–47.0)
HEMOGLOBIN: 13.8 g/dL (ref 12.0–16.0)
MCH: 29.6 pg (ref 26.0–34.0)
MCHC: 33 g/dL (ref 32.0–36.0)
MCV: 89.7 fL (ref 80.0–100.0)
Platelets: 286 10*3/uL (ref 150–440)
RBC: 4.67 MIL/uL (ref 3.80–5.20)
RDW: 13 % (ref 11.5–14.5)
WBC: 12.9 10*3/uL — ABNORMAL HIGH (ref 3.6–11.0)

## 2015-09-25 LAB — TROPONIN I

## 2015-09-25 MED ORDER — ONDANSETRON HCL 4 MG/2ML IJ SOLN
4.0000 mg | Freq: Once | INTRAMUSCULAR | Status: AC
  Administered 2015-09-25: 4 mg via INTRAVENOUS
  Filled 2015-09-25: qty 2

## 2015-09-25 MED ORDER — PREDNISONE 20 MG PO TABS: 40.0000 mg | ORAL_TABLET | Freq: Every day | ORAL | Status: DC

## 2015-09-25 MED ORDER — FENTANYL CITRATE (PF) 100 MCG/2ML IJ SOLN
50.0000 ug | Freq: Once | INTRAMUSCULAR | Status: AC
  Administered 2015-09-25: 50 ug via INTRAVENOUS
  Filled 2015-09-25: qty 2

## 2015-09-25 NOTE — Discharge Instructions (Signed)
Please follow-up with her primary care physician tomorrow. Please take your pain medication and antibiotics as prescribed during her hospitalization. Please take her steroids as prescribed by myself. Return to the emergency department for any acutely concerning symptoms.   Chest Pain (Nonspecific) It is often hard to give a specific diagnosis for the cause of chest pain. There is always a chance that your pain could be related to something serious, such as a heart attack or a blood clot in the lungs. You need to follow up with your health care provider for further evaluation. CAUSES   Heartburn.  Pneumonia or bronchitis.  Anxiety or stress.  Inflammation around your heart (pericarditis) or lung (pleuritis or pleurisy).  A blood clot in the lung.  A collapsed lung (pneumothorax). It can develop suddenly on its own (spontaneous pneumothorax) or from trauma to the chest.  Shingles infection (herpes zoster virus). The chest wall is composed of bones, muscles, and cartilage. Any of these can be the source of the pain.  The bones can be bruised by injury.  The muscles or cartilage can be strained by coughing or overwork.  The cartilage can be affected by inflammation and become sore (costochondritis). DIAGNOSIS  Lab tests or other studies may be needed to find the cause of your pain. Your health care provider may have you take a test called an ambulatory electrocardiogram (ECG). An ECG records your heartbeat patterns over a 24-hour period. You may also have other tests, such as:  Transthoracic echocardiogram (TTE). During echocardiography, sound waves are used to evaluate how blood flows through your heart.  Transesophageal echocardiogram (TEE).  Cardiac monitoring. This allows your health care provider to monitor your heart rate and rhythm in real time.  Holter monitor. This is a portable device that records your heartbeat and can help diagnose heart arrhythmias. It allows your health  care provider to track your heart activity for several days, if needed.  Stress tests by exercise or by giving medicine that makes the heart beat faster. TREATMENT   Treatment depends on what may be causing your chest pain. Treatment may include:  Acid blockers for heartburn.  Anti-inflammatory medicine.  Pain medicine for inflammatory conditions.  Antibiotics if an infection is present.  You may be advised to change lifestyle habits. This includes stopping smoking and avoiding alcohol, caffeine, and chocolate.  You may be advised to keep your head raised (elevated) when sleeping. This reduces the chance of acid going backward from your stomach into your esophagus. Most of the time, nonspecific chest pain will improve within 2-3 days with rest and mild pain medicine.  HOME CARE INSTRUCTIONS   If antibiotics were prescribed, take them as directed. Finish them even if you start to feel better.  For the next few days, avoid physical activities that bring on chest pain. Continue physical activities as directed.  Do not use any tobacco products, including cigarettes, chewing tobacco, or electronic cigarettes.  Avoid drinking alcohol.  Only take medicine as directed by your health care provider.  Follow your health care provider's suggestions for further testing if your chest pain does not go away.  Keep any follow-up appointments you made. If you do not go to an appointment, you could develop lasting (chronic) problems with pain. If there is any problem keeping an appointment, call to reschedule. SEEK MEDICAL CARE IF:   Your chest pain does not go away, even after treatment.  You have a rash with blisters on your chest.  You have a  fever. SEEK IMMEDIATE MEDICAL CARE IF:   You have increased chest pain or pain that spreads to your arm, neck, jaw, back, or abdomen.  You have shortness of breath.  You have an increasing cough, or you cough up blood.  You have severe back or  abdominal pain.  You feel nauseous or vomit.  You have severe weakness.  You faint.  You have chills. This is an emergency. Do not wait to see if the pain will go away. Get medical help at once. Call your local emergency services (911 in [place] Do not drive yourself to the hospital. MAKE SURE YOU:   Understand these instructions.  Will watch your condition.  Will get help right away if you are not doing well or get worse. Document Released: 09/19/2005 Document Revised: 12/15/2013 Document Reviewed: 07/15/2008 [facility] Patient Information 2015 [place], [place]. This information is not intended to replace advice given to you by your health care provider. Make sure you discuss any questions you have with your health care provider.  Pleurisy Pleurisy is redness, puffiness (swelling), and soreness (inflammation) of the lining of the lungs. It can be hard to breathe and hurt to breathe. Coughing or deep breathing will make it hurt more. It is often caused by an existing infection or disease.  HOME CARE  Only take medicine as told by your doctor.  Only take antibiotic medicine as directed. Make sure to finish it even if you start to feel better. GET HELP RIGHT AWAY IF:   Your lips, fingernails, or toenails are blue or dark.  You cough up blood.  You have a hard time breathing.  Your pain is not controlled with medicine or it lasts for more than 1 week.  Your pain spreads (radiates) into your neck, arms, or jaw.  You are short of breath or wheezing.  You develop a fever, rash, throw up (vomit), or faint. MAKE SURE YOU:   Understand these instructions.  Will watch your condition.  Will get help right away if you are not doing well or get worse. Document Released: 11/22/2008 Document Revised: 08/12/2013 Document Reviewed: 05/24/2013 [facility] Patient Information 2015 [place], [place]. This information is not intended to replace advice given to you by your health care provider.  Make sure you discuss any questions you have with your health care provider.

## 2015-09-25 NOTE — ED Notes (Signed)
Bib ems c/o continued left sided chest pain sharp, with sob and syncopal episode per ems. Pt seen for same in ed. States no change in condition but no improvement.

## 2015-09-25 NOTE — ED Provider Notes (Signed)
[facility] Emergency Department Provider Note  Time seen: 7:56 AM  I have reviewed the triage vital signs and the nursing notes.   HISTORY  Chief Complaint Chest Pain    HPI Ashley [name] is a 58 y.o. female with past medical history of COPD, hypertension, anxiety presents the emergency department left-sided chest pain. Patient states ongoing left-sided chest pain for the past 6 days. Patient was admitted to the hospital on Thursday and discharged yesterday for the same left-sided chest pain. She has undergone 4 sets of cardiac enzymes which are negative, CT angiography of the chest which ruled out pulmonary emboli, pneumonia, pneumothorax. Patient states the chest pain continues, is severe, worse with deep breath or any type of movement of the chest. Located in the left lateral chest. States it is a sharp stabbing pain. Unchanged since discharge. Patient has pain medication at home which she has been taking without relief. Patient was also prescribed an antibiotic which she is currently taking but again without relief.Denies any abdominal pain, nausea, vomiting, diarrhea. States baseline shortness of breath but denies any acute exacerbation.     Past Medical History  Diagnosis Date  . COPD (chronic obstructive pulmonary disease) (HCC)   . Hypertension   . Anxiety     Patient Active Problem List   Diagnosis Date Noted  . Chest pain 09/22/2015    Past Surgical History  Procedure Laterality Date  . Abdominal hysterectomy      Current Outpatient Rx  Name  Route  Sig  Dispense  Refill  . albuterol (PROAIR HFA) 108 (90 BASE) MCG/ACT inhaler   Inhalation   Inhale 2 puffs into the lungs every 6 (six) hours as needed.         Marland Kitchen aspirin EC 81 MG tablet   Oral   Take 1 tablet by mouth daily.         Marland Kitchen azithromycin (ZITHROMAX) 250 MG tablet      Take as directed   6 each   0   . Fluticasone-Salmeterol (ADVAIR DISKUS) 500-50 MCG/DOSE AEPB    Inhalation   Inhale 1 Dose into the lungs daily.         . hydrochlorothiazide (MICROZIDE) 12.5 MG capsule   Oral   Take 1 capsule by mouth daily.         Marland Kitchen ipratropium-albuterol (DUONEB) 0.5-2.5 (3) MG/3ML SOLN   Inhalation   Inhale 3 mLs into the lungs 4 (four) times daily.         Marland Kitchen lisinopril (PRINIVIL,ZESTRIL) 10 MG tablet   Oral   Take 1 tablet by mouth daily.         Marland Kitchen oxyCODONE (OXY IR/ROXICODONE) 5 MG immediate release tablet   Oral   Take 1 tablet (5 mg total) by mouth every 6 (six) hours as needed for moderate pain.   25 tablet   0   . tiotropium (SPIRIVA) 18 MCG inhalation capsule   Inhalation   Place 1 capsule into inhaler and inhale daily.           Allergies Review of patient's allergies indicates no known allergies.  Family History  Problem Relation Age of Onset  . CAD Mother   . CAD Father   . Cancer - Prostate Brother   . Cancer - Lung Brother     Social History Social History  Substance Use Topics  . Smoking status: Current Every Day Smoker -- 0.50 packs/day    Types: Cigarettes  . Smokeless  tobacco: None  . Alcohol Use: No    Review of Systems Constitutional: Negative for fever. Cardiovascular: Positive left chest pain. Respiratory: Shortness of breath unchanged from baseline. Gastrointestinal: Negative for abdominal pain, vomiting and diarrhea. Neurological: Negative for headache 10-point ROS otherwise negative.  ____________________________________________   PHYSICAL EXAM:  VITAL SIGNS: ED Triage Vitals  Enc Vitals Group     BP 09/25/15 0747 155/78 mmHg     Pulse Rate 09/25/15 0747 110     Resp 09/25/15 0747 18     Temp 09/25/15 0755 99.9 F (37.7 C)     Temp src --      SpO2 09/25/15 0738 95 %     Weight --      Height --      Head Cir --      Peak Flow --      Pain Score 09/25/15 0748 8     Pain Loc --      Pain Edu? --      Excl. in GC? --    Constitutional: Alert and oriented. Well appearing and in no  distress. Eyes: Normal exam ENT   Head: Normocephalic and atraumatic.   Mouth/Throat: Mucous membranes are moist. Cardiovascular: Normal rate, regular rhythm. No murmur Respiratory: Normal respiratory effort without tachypnea nor retractions. Breath sounds are clear and equal bilaterally. No wheezes/rales/rhonchi. Gastrointestinal: Soft and nontender. No distention.  Musculoskeletal: Nontender with normal range of motion in all extremities. No lower extremity tenderness or edema. Neurologic:  Normal speech and language. No gross focal neurologic deficits Psychiatric: Mood and affect are normal. Speech and behavior are normal.  ____________________________________________    EKG  EKG reviewed and interpreted by myself shows sinus tachycardia 109 bpm, narrow QRS, normal axis, normal intervals, nonspecific ST changes present. No ST elevations noted.  ____________________________________________    RADIOLOGY  Chest x-ray shows no acute change.  ____________________________________________   INITIAL IMPRESSION / ASSESSMENT AND PLAN / ED COURSE  Pertinent labs & imaging results that were available during my care of the patient were reviewed by me and considered in my medical decision making (see chart for details).  Patient with continued left chest pain. I reviewed the patient's recent hospitalization, patient had 4 negative cardiac enzymes, negative CT angiography. Patient does have a borderline temperature 99.9 in the emergency department. Patient is currently on azithromycin. Patient has oxycodone at home for pain control but states it is not working, but also states she has not taken any since yesterday. I will check repeat labs including repeat troponin, and chest x-ray. It is unclear at this time the cause of the patient's discomfort, highly suspect pleurisy, versus COPD exacerbation/bronchiectasis causing pain. We will dose fentanyl in the emergency department for pain  control, and closely monitor. Besides pain control and I'm not sure exactly what we could offer the patient as far as further workup/management.  Labs are largely within normal limits per a chest x-ray shows no acute change. Continues to have 92-96% room air saturation. Appears well besides intermittent left chest pain. Patient son is here with her states they've not started the home pain medication prescribed to her yet. I will discharge patient on a short steroid burst in addition to her prescribed pain medication and Zithromax from her hospitalization. Patient is follow up with her primary care doctor on Monday.   ____________________________________________   FINAL CLINICAL IMPRESSION(S) / ED DIAGNOSES  Left chest pain Pleurisy  Minna Antis, MD 09/25/15 1100

## 2015-09-30 ENCOUNTER — Emergency Department: Payer: Self-pay

## 2015-09-30 ENCOUNTER — Inpatient Hospital Stay
Admission: EM | Admit: 2015-09-30 | Discharge: 2015-10-05 | DRG: 308 | Disposition: A | Discharge status: Home / Self Care | Payer: Self-pay | Attending: Internal Medicine | Admitting: Internal Medicine

## 2015-09-30 ENCOUNTER — Encounter: Payer: Self-pay | Admitting: *Deleted

## 2015-09-30 DIAGNOSIS — F419 Anxiety disorder, unspecified: Secondary | ICD-10-CM | POA: Diagnosis present

## 2015-09-30 DIAGNOSIS — Z7982 Long term (current) use of aspirin: Secondary | ICD-10-CM

## 2015-09-30 DIAGNOSIS — Z792 Long term (current) use of antibiotics: Secondary | ICD-10-CM

## 2015-09-30 DIAGNOSIS — L8990 Pressure ulcer of unspecified site, unspecified stage: Secondary | ICD-10-CM | POA: Insufficient documentation

## 2015-09-30 DIAGNOSIS — E43 Unspecified severe protein-calorie malnutrition: Secondary | ICD-10-CM | POA: Insufficient documentation

## 2015-09-30 DIAGNOSIS — Z90710 Acquired absence of both cervix and uterus: Secondary | ICD-10-CM

## 2015-09-30 DIAGNOSIS — J181 Lobar pneumonia, unspecified organism: Secondary | ICD-10-CM | POA: Diagnosis present

## 2015-09-30 DIAGNOSIS — Z801 Family history of malignant neoplasm of trachea, bronchus and lung: Secondary | ICD-10-CM

## 2015-09-30 DIAGNOSIS — B370 Candidal stomatitis: Secondary | ICD-10-CM | POA: Diagnosis present

## 2015-09-30 DIAGNOSIS — A419 Sepsis, unspecified organism: Secondary | ICD-10-CM | POA: Diagnosis present

## 2015-09-30 DIAGNOSIS — Z8042 Family history of malignant neoplasm of prostate: Secondary | ICD-10-CM

## 2015-09-30 DIAGNOSIS — F515 Nightmare disorder: Secondary | ICD-10-CM | POA: Diagnosis present

## 2015-09-30 DIAGNOSIS — E876 Hypokalemia: Secondary | ICD-10-CM | POA: Diagnosis present

## 2015-09-30 DIAGNOSIS — I471 Supraventricular tachycardia, unspecified: Secondary | ICD-10-CM

## 2015-09-30 DIAGNOSIS — Z8249 Family history of ischemic heart disease and other diseases of the circulatory system: Secondary | ICD-10-CM

## 2015-09-30 DIAGNOSIS — F329 Major depressive disorder, single episode, unspecified: Secondary | ICD-10-CM | POA: Diagnosis present

## 2015-09-30 DIAGNOSIS — Y95 Nosocomial condition: Secondary | ICD-10-CM | POA: Diagnosis present

## 2015-09-30 DIAGNOSIS — F17210 Nicotine dependence, cigarettes, uncomplicated: Secondary | ICD-10-CM | POA: Diagnosis present

## 2015-09-30 DIAGNOSIS — R197 Diarrhea, unspecified: Secondary | ICD-10-CM

## 2015-09-30 DIAGNOSIS — I248 Other forms of acute ischemic heart disease: Secondary | ICD-10-CM | POA: Diagnosis present

## 2015-09-30 DIAGNOSIS — J189 Pneumonia, unspecified organism: Secondary | ICD-10-CM

## 2015-09-30 DIAGNOSIS — Z7952 Long term (current) use of systemic steroids: Secondary | ICD-10-CM

## 2015-09-30 DIAGNOSIS — R079 Chest pain, unspecified: Secondary | ICD-10-CM | POA: Diagnosis present

## 2015-09-30 DIAGNOSIS — I119 Hypertensive heart disease without heart failure: Secondary | ICD-10-CM | POA: Diagnosis present

## 2015-09-30 DIAGNOSIS — J441 Chronic obstructive pulmonary disease with (acute) exacerbation: Secondary | ICD-10-CM | POA: Diagnosis present

## 2015-09-30 DIAGNOSIS — E46 Unspecified protein-calorie malnutrition: Secondary | ICD-10-CM

## 2015-09-30 DIAGNOSIS — R001 Bradycardia, unspecified: Secondary | ICD-10-CM | POA: Diagnosis present

## 2015-09-30 LAB — LACTIC ACID, PLASMA
Lactic Acid, Venous: 2.3 mmol/L (ref 0.5–2.0)
Lactic Acid, Venous: 1.1 mmol/L (ref 0.5–2.0)

## 2015-09-30 LAB — BASIC METABOLIC PANEL
ANION GAP: 15 (ref 5–15)
BUN: 12 mg/dL (ref 6–20)
CALCIUM: 9.4 mg/dL (ref 8.9–10.3)
CO2: 23 mmol/L (ref 22–32)
Chloride: 96 mmol/L — ABNORMAL LOW (ref 101–111)
Creatinine, Ser: 0.74 mg/dL (ref 0.44–1.00)
GFR calc Af Amer: >60 mL/min (ref >60)
GLUCOSE: 167 mg/dL — AB (ref 65–99)
POTASSIUM: 4.4 mmol/L (ref 3.5–5.1)
SODIUM: 134 mmol/L — AB (ref 135–145)

## 2015-09-30 LAB — CBC
HCT: 45.9 % (ref 35.0–47.0)
Hemoglobin: 15 g/dL (ref 12.0–16.0)
MCH: 29.2 pg (ref 26.0–34.0)
MCHC: 32.6 g/dL (ref 32.0–36.0)
MCV: 89.6 fL (ref 80.0–100.0)
PLATELETS: 376 10*3/uL (ref 150–440)
RBC: 5.12 MIL/uL (ref 3.80–5.20)
RDW: 12.7 % (ref 11.5–14.5)
WBC: 32.7 10*3/uL — AB (ref 3.6–11.0)

## 2015-09-30 LAB — TROPONIN I
TROPONIN I: 0.03 ng/mL (ref <0.031)
Troponin I: 0.05 ng/mL — ABNORMAL HIGH (ref <0.031)
Troponin I: 0.05 ng/mL — ABNORMAL HIGH (ref <0.031)

## 2015-09-30 MED ORDER — DEXTROSE 5 % IV SOLN: 60.0000 mg/h | Freq: Once | INTRAVENOUS | Status: DC

## 2015-09-30 MED ORDER — SODIUM CHLORIDE 0.9 % IV SOLN
INTRAVENOUS | Status: DC
  Administered 2015-09-30 – 2015-10-03 (×2): via INTRAVENOUS

## 2015-09-30 MED ORDER — NITROGLYCERIN 0.4 MG SL SUBL: 0.4000 mg | SUBLINGUAL_TABLET | SUBLINGUAL | Status: DC | PRN

## 2015-09-30 MED ORDER — DEXTROSE 5 % IV SOLN
250.0000 mg | INTRAVENOUS | Status: DC
  Administered 2015-09-30: 250 mg via INTRAVENOUS
  Filled 2015-09-30 (×2): qty 250

## 2015-09-30 MED ORDER — AMIODARONE LOAD VIA INFUSION
150.0000 mg | Freq: Once | INTRAVENOUS | Status: AC
  Administered 2015-09-30: 150 mg via INTRAVENOUS
  Filled 2015-09-30: qty 83.34

## 2015-09-30 MED ORDER — DEXTROSE 5 % IV SOLN
1.0000 g | INTRAVENOUS | Status: DC
  Administered 2015-09-30 – 2015-10-02 (×3): 1 g via INTRAVENOUS
  Filled 2015-09-30 (×4): qty 10

## 2015-09-30 MED ORDER — ENOXAPARIN SODIUM 40 MG/0.4ML ~~LOC~~ SOLN
40.0000 mg | SUBCUTANEOUS | Status: DC
  Administered 2015-09-30 – 2015-10-02 (×3): 40 mg via SUBCUTANEOUS
  Filled 2015-09-30 (×3): qty 0.4

## 2015-09-30 MED ORDER — OXYCODONE HCL 5 MG PO TABS: 5.0000 mg | ORAL_TABLET | Freq: Four times a day (QID) | ORAL | Status: DC | PRN

## 2015-09-30 MED ORDER — ADENOSINE 6 MG/2ML IV SOLN
6.0000 mg | Freq: Once | INTRAVENOUS | Status: AC
  Administered 2015-09-30: 6 mg via INTRAVENOUS

## 2015-09-30 MED ORDER — AMIODARONE HCL IN DEXTROSE 360-4.14 MG/200ML-% IV SOLN
30.0000 mg/h | INTRAVENOUS | Status: DC
  Administered 2015-09-30 – 2015-10-01 (×2): 30 mg/h via INTRAVENOUS
  Filled 2015-09-30 (×3): qty 200

## 2015-09-30 MED ORDER — AMIODARONE HCL IN DEXTROSE 360-4.14 MG/200ML-% IV SOLN
60.0000 mg/h | INTRAVENOUS | Status: AC
  Administered 2015-09-30 (×2): 60 mg/h via INTRAVENOUS
  Filled 2015-09-30: qty 200

## 2015-09-30 MED ORDER — METHYLPREDNISOLONE SODIUM SUCC 125 MG IJ SOLR
60.0000 mg | Freq: Four times a day (QID) | INTRAMUSCULAR | Status: DC
Start: 2015-09-30 — End: 2015-10-01
  Administered 2015-09-30 – 2015-10-01 (×3): 60 mg via INTRAVENOUS
  Filled 2015-09-30 (×3): qty 2

## 2015-09-30 MED ORDER — VANCOMYCIN HCL IN DEXTROSE 1-5 GM/200ML-% IV SOLN
1000.0000 mg | Freq: Once | INTRAVENOUS | Status: AC
  Administered 2015-09-30: 1000 mg via INTRAVENOUS
  Filled 2015-09-30: qty 200

## 2015-09-30 MED ORDER — PIPERACILLIN-TAZOBACTAM 3.375 G IVPB
3.3750 g | Freq: Once | INTRAVENOUS | Status: AC
  Administered 2015-09-30: 3.375 g via INTRAVENOUS
  Filled 2015-09-30: qty 50

## 2015-09-30 MED ORDER — METHYLPREDNISOLONE SODIUM SUCC 125 MG IJ SOLR
125.0000 mg | INTRAMUSCULAR | Status: AC
  Administered 2015-09-30: 125 mg via INTRAVENOUS
  Filled 2015-09-30: qty 2

## 2015-09-30 MED ORDER — ADENOSINE 6 MG/2ML IV SOLN
INTRAVENOUS | Status: AC
  Filled 2015-09-30: qty 2

## 2015-09-30 MED ORDER — TIOTROPIUM BROMIDE MONOHYDRATE 18 MCG IN CAPS
1.0000 | ORAL_CAPSULE | Freq: Every day | RESPIRATORY_TRACT | Status: DC
  Administered 2015-10-01 – 2015-10-05 (×5): 18 ug via RESPIRATORY_TRACT
  Filled 2015-09-30 (×2): qty 5

## 2015-09-30 MED ORDER — ASPIRIN EC 81 MG PO TBEC
81.0000 mg | DELAYED_RELEASE_TABLET | Freq: Every day | ORAL | Status: DC
  Administered 2015-09-30 – 2015-10-05 (×6): 81 mg via ORAL
  Filled 2015-09-30 (×6): qty 1

## 2015-09-30 MED ORDER — ADENOSINE 6 MG/2ML IV SOLN
INTRAVENOUS | Status: AC
  Administered 2015-09-30: 6 mg via INTRAVENOUS
  Filled 2015-09-30: qty 2

## 2015-09-30 MED ORDER — SODIUM CHLORIDE 0.9 % IV BOLUS (SEPSIS)
1000.0000 mL | Freq: Once | INTRAVENOUS | Status: AC
  Administered 2015-09-30: 1000 mL via INTRAVENOUS

## 2015-09-30 MED ORDER — ADENOSINE 12 MG/4ML IV SOLN
INTRAVENOUS | Status: AC
  Administered 2015-09-30: 12 mg
  Filled 2015-09-30: qty 4

## 2015-09-30 MED ORDER — IPRATROPIUM-ALBUTEROL 0.5-2.5 (3) MG/3ML IN SOLN: 3.0000 mL | Freq: Four times a day (QID) | RESPIRATORY_TRACT | Status: DC | PRN

## 2015-09-30 NOTE — ED Notes (Signed)
Supply [employer] called for 0.22 micron filter

## 2015-09-30 NOTE — ED Notes (Signed)
Pt arriveas via EMS from home, pt states she was seen in ER for the same symptoms last week and told she had pleurisy, pt states this AM she felt like she could not breathe, arrives on [address]L [place], EMS reports pt o2 leves dropped in the 80s

## 2015-09-30 NOTE — H&P (Addendum)
[facility] [place] at [facility]   PATIENT NAME: Ashley [name]    MR#:  [id number]  DATE OF BIRTH:  1956/02/07  DATE OF ADMISSION:  09/30/2015  PRIMARY CARE PHYSICIAN: Suan Halter, MD   REQUESTING/REFERRING PHYSICIAN: Quale  CHIEF COMPLAINT:   Chief Complaint  Patient presents with  . Shortness of Breath  . Chest Pain    HISTORY OF PRESENT ILLNESS: Ashley [name]  is a 59 y.o. female with a known history of COPD, hypertension who was admitted to hospital last week and treated for bronchitis. The patient says that last time also when she came her main complaint was pain on the left side of her chest but her troponins and telemetry readings plus echocardiogram negative so she was treated for bronchitis and sent home with azithromycin prescription. She claims she never recovered from that since she is home she is not able to walk too much and getting short of breath and chest pain she was getting severe short of breath so finally decided to come back to emergency room today. She also had complain of feeling of palpitation and dizziness on trying to stand up some most of the time she was just pain in her bed in last 1 week at home. The pain in the chest is mostly on the left side and nonradiating 7-8 out of 10. In ER she was noted to have a supracondylar tachycardia heart rate ranging up to 140s and 150s she was given Indocin injection following that she had bradycardia for few seconds. So ER physician spoke to Dr. Park Breed- he suggested to start on IV amiodarone drip. As there are no beds available and Hospital CCU he suggested to admit the patient on telemetry floor with fixed dose amiodarone drip has otherwise blood pressure and mentations are stable. She was also found to have pneumonia and left upper lobe on chest x-ray which is new compared to last week.  PAST MEDICAL HISTORY:   Past Medical History  Diagnosis Date  . COPD (chronic obstructive pulmonary disease)  (HCC)   . Hypertension   . Anxiety     PAST SURGICAL HISTORY:  Past Surgical History  Procedure Laterality Date  . Abdominal hysterectomy      SOCIAL HISTORY:  Social History  Substance Use Topics  . Smoking status: Current Every Day Smoker -- 0.50 packs/day    Types: Cigarettes  . Smokeless tobacco: Not on file  . Alcohol Use: No    FAMILY HISTORY:  Family History  Problem Relation Age of Onset  . CAD Mother   . CAD Father   . Cancer - Prostate Brother   . Cancer - Lung Brother     DRUG ALLERGIES: No Known Allergies  REVIEW OF SYSTEMS:   CONSTITUTIONAL: No fever, positive for fatigue or weakness.  EYES: No blurred or double vision.  EARS, NOSE, AND THROAT: No tinnitus or ear pain.  RESPIRATORY: positive for cough & shortness of breath,no wheezing or hemoptysis.  CARDIOVASCULAR: positive for chest pain, orthopnea, edema.  GASTROINTESTINAL: No nausea, vomiting, diarrhea or abdominal pain.  GENITOURINARY: No dysuria, hematuria.  ENDOCRINE: No polyuria, nocturia,  HEMATOLOGY: No anemia, easy bruising or bleeding SKIN: No rash or lesion. MUSCULOSKELETAL: No joint pain or arthritis.   NEUROLOGIC: No tingling, numbness, weakness.  PSYCHIATRY: No anxiety or depression.   MEDICATIONS AT HOME:  Prior to Admission medications   Medication Sig Start Date End Date Taking? Authorizing Provider  albuterol (PROAIR HFA) 108 (90 BASE) MCG/ACT  inhaler Inhale 2 puffs into the lungs every 6 (six) hours as needed for wheezing or shortness of breath.    Yes Historical Provider, MD  aspirin EC 81 MG tablet Take 81 mg by mouth daily.    Yes Historical Provider, MD  Fluticasone-Salmeterol (ADVAIR DISKUS) 500-50 MCG/DOSE AEPB Inhale 1 puff into the lungs 2 (two) times daily.    Yes Historical Provider, MD  ipratropium-albuterol (DUONEB) 0.5-2.5 (3) MG/3ML SOLN Inhale 3 mLs into the lungs 4 (four) times daily as needed (for shortness of breath and/or wheezing).    Yes Historical Provider,  MD  tiotropium (SPIRIVA) 18 MCG inhalation capsule Place 1 capsule into inhaler and inhale daily.   Yes Historical Provider, MD  azithromycin (ZITHROMAX) 250 MG tablet Take as directed Patient not taking: Reported on 09/30/2015 09/24/15   Enedina Finner, MD  oxyCODONE (OXY IR/ROXICODONE) 5 MG immediate release tablet Take 1 tablet (5 mg total) by mouth every 6 (six) hours as needed for moderate pain. Patient not taking: Reported on 09/30/2015 09/24/15   Enedina Finner, MD  predniSONE (DELTASONE) 20 MG tablet Take 2 tablets (40 mg total) by mouth daily. Patient not taking: Reported on 09/30/2015 09/25/15   Minna Antis, MD      PHYSICAL EXAMINATION:   VITAL SIGNS: Blood pressure 118/69, pulse 108, temperature 99.6 F (37.6 C), temperature source Oral, resp. rate 23, height 5\' 2"  (1.575 m), weight 31.752 kg (70 lb), SpO2 99 %.  GENERAL:  59 y.o.-year-old cachectic patient lying in the bed with no acute distress.  EYES: Pupils equal, round, reactive to light and accommodation. No scleral icterus. Extraocular muscles intact.  HEENT: Head atraumatic, normocephalic. Oropharynx and nasopharynx clear.  NECK:  Supple, no jugular venous distention. No thyroid enlargement, no tenderness.  LUNGS: Normal breath sounds bilaterally, no wheezing, mild crepitation. No use of accessory muscles of respiration.  CARDIOVASCULAR: S1, S2 present, tachycardia. No murmurs.  ABDOMEN: Soft, nontender, nondistended. Bowel sounds present. No organomegaly or mass.  EXTREMITIES: No pedal edema, cyanosis, or clubbing.  NEUROLOGIC: Cranial nerves II through XII are intact. Muscle strength 5/5 in all extremities. Sensation intact. Gait not checked.  PSYCHIATRIC: The patient is alert and oriented x 3.  SKIN: No obvious rash, lesion, or ulcer.   LABORATORY PANEL:   CBC  Recent Labs Lab 09/24/15 0547 09/25/15 0746 09/30/15 1338  WBC 9.4 12.9* 32.7*  HGB 12.4 13.8 15.0  HCT 37.1 41.9 45.9  PLT 258 286 376  MCV 90.3 89.7  89.6  MCH 30.2 29.6 29.2  MCHC 33.4 33.0 32.6  RDW 12.6 13.0 12.7   ------------------------------------------------------------------------------------------------------------------  Chemistries   Recent Labs Lab 09/24/15 0547 09/25/15 0746 09/30/15 1338  NA 141 135 134*  K 3.7 3.3* 4.4  CL 105 99* 96*  CO2 30 27 23   GLUCOSE 89 135* 167*  BUN 12 8 12   CREATININE 0.51 0.51 0.74  CALCIUM 8.4* 8.8* 9.4  AST  --  15  --   ALT  --  9*  --   ALKPHOS  --  80  --   BILITOT  --  0.8  --    ------------------------------------------------------------------------------------------------------------------ estimated creatinine clearance is 37.1 mL/min (by C-G formula based on Cr of 0.74). ------------------------------------------------------------------------------------------------------------------ No results for input(s): TSH, T4TOTAL, T3FREE, THYROIDAB in the last 72 hours.  Invalid input(s): FREET3   Coagulation profile No results for input(s): INR, PROTIME in the last 168 hours. ------------------------------------------------------------------------------------------------------------------- No results for input(s): DDIMER in the last 72 hours. -------------------------------------------------------------------------------------------------------------------  Cardiac Enzymes  Recent Labs Lab 09/25/15 0746 09/30/15 1338  TROPONINI <0.03 0.03   ------------------------------------------------------------------------------------------------------------------ Invalid input(s): POCBNP  ---------------------------------------------------------------------------------------------------------------  Urinalysis    Component Value Date/Time   COLORURINE Straw 12/04/2013 1306   APPEARANCEUR Clear 12/04/2013 1306   LABSPEC 1.004 12/04/2013 1306   PHURINE 7.0 12/04/2013 1306   GLUCOSEU Negative 12/04/2013 1306   HGBUR Negative 12/04/2013 1306   BILIRUBINUR Negative  12/04/2013 1306   KETONESUR Negative 12/04/2013 1306   PROTEINUR Negative 12/04/2013 1306   NITRITE Negative 12/04/2013 1306   LEUKOCYTESUR Trace 12/04/2013 1306     RADIOLOGY: Dg Chest Port 1 View  09/30/2015   CLINICAL DATA:  Severe shortness of breath, COPD, hypertension, smoker  EXAM: PORTABLE CHEST 1 VIEW  COMPARISON:  Portable exam 1427 hours compared to 09/25/2015  FINDINGS: External pacing leads project over chest.  Normal heart size, mediastinal contours and pulmonary vascularity.  Hyperinflation and significant emphysematous changes compatible with COPD.  LEFT upper lobe infiltrate compatible with pneumonia.  Remaining lungs clear.  No definite pleural effusion or pneumothorax.  IMPRESSION: COPD changes with LEFT upper lobe pneumonia.   Electronically Signed   By: Ulyses Southward M.D.   On: 09/30/2015 14:35    IMPRESSION AND PLAN:  * Supraventricular tachycardia   Patient is started on amiodarone IV drip by ER, after consulting with cardiologist Dr. Park Breed.  We will monitor on telemetry, follow serial troponin, check echocardiogram.  Further plan as per Dr. Park Breed.  * Pneumonia  Left upper lobe pneumonia new finding compared to previous x-ray last week.  Patient have some chronic changes on left upper lobe on CT scan done last week.  I will give her Rocephin and azithromycin for now, and collect sputum culture to guide further therapy.  I will also call pulmonary consult to help.  * COPD  As patient never recovered from her last week's episode continues to feel short of breath, I will resume IV steroid and nebulizer therapy.  * Hypertension  Blood pressure is stable, continue monitoring.  * Smoking  Counseled to stop smoking for 4 minutes, she agreed that since last week she has not smoked when she metoprolol stick to this decision.  All the records are reviewed and case discussed with ED provider. Management plans discussed with the patient, family and they are in  agreement.  CODE STATUS:    Code Status Orders        Start     Ordered   09/30/15 1604  Full code   Continuous     09/30/15 1604     [name] husband present in the room during my interview and examination, plan explained to him and he also in agreement with the plan. Condition is critical because of presence of supraventricular tachycardia and requirement of IV rate control medication.  TOTAL TIME TAKING CARE OF THIS PATIENT: 50 critical care minutes.    Altamese Dilling M.D on 09/30/2015   Between 7am to 6pm - Pager - [phone]  After 6pm go to [email or web address] - password [email or web address] [facility]  [facility]  Office  [phone]  CC: Primary care physician; Suan Halter, MD   Note: This dictation was prepared with Dragon dictation along with smaller phrase technology. Any transcriptional errors that result from this process are unintentional.

## 2015-09-30 NOTE — ED Notes (Signed)
Dr. Quale notified of critical lactic acid 

## 2015-09-30 NOTE — Progress Notes (Signed)
Ashley [name] is a 59 y.o. female  [id number]  Primary Cardiologist: Adrian Blackwater Reason for Consultation: SVT and chest pain  HPI: This is a 59 year old white female with a past medical history of hypertension was seen in my office 2 years ago and had an unremarkable echo and a straight a stress test for atypical chest pain. Patient presented now with severe chest pain for the past 10 days associated with severe shortness of breath and diaphoresis. Patient states she has tightness in the chest. She was found to have supraventricular tachycardia and was given adenosine twice and went into sinus tachycardia. Her white count is 32.   Review of Systems: Review of Systems  Respiratory: Positive for cough and shortness of breath.   Cardiovascular: Positive for chest pain.  All other systems reviewed and are negative.    Past Medical History  Diagnosis Date  . COPD (chronic obstructive pulmonary disease) (HCC)   . Hypertension   . Anxiety      (Not in a hospital admission)   . adenosine        Infusions: . amiodarone 60 mg/hr (09/30/15 1442)   Followed by  . amiodarone    . piperacillin-tazobactam (ZOSYN)  IV 3.375 g (09/30/15 1503)  . vancomycin      No Known Allergies  Social History   Social History  . Marital Status: Married    Spouse Name: N/A  . Number of Children: N/A  . Years of Education: N/A   Occupational History  . Not on file.   Social History Main Topics  . Smoking status: Current Every Day Smoker -- 0.50 packs/day    Types: Cigarettes  . Smokeless tobacco: Not on file  . Alcohol Use: No  . Drug Use: No  . Sexual Activity: Not on file   Other Topics Concern  . Not on file   Social History Narrative   Lives at home with husband. Steady on ambulation normally at home.    Family History  Problem Relation Age of Onset  . CAD Mother   . CAD Father   . Cancer - Prostate Brother   . Cancer - Lung Brother     PHYSICAL EXAM: Filed Vitals:    09/30/15 1442  BP: 121/73  Pulse: 132  Temp:   Resp: 25    No intake or output data in the 24 hours ending 09/30/15 1506  General:  Well appearing. No respiratory difficulty HEENT: normal Neck: supple. no JVD. Carotids 2+ bilat; no bruits. No lymphadenopathy or thryomegaly appreciated. Cor: PMI nondisplaced. Regular rate & rhythm. No rubs, gallops or murmurs. Lungs: clear Abdomen: soft, nontender, nondistended. No hepatosplenomegaly. No bruits or masses. Good bowel sounds. Extremities: no cyanosis, clubbing, rash, edema Neuro: alert & oriented x 3, cranial nerves grossly intact. moves all 4 extremities w/o difficulty. Affect pleasant.  ECG: Sinus tachycardia 1 50 bpm with peak T waves in and biatrial enlargement. Initial EKG showed SVT with heart rate 150 and not peaked T waves suggestive of ischemia  Results for orders placed or performed during the hospital encounter of 09/30/15 (from the past 24 hour(s))  CBC     Status: Abnormal   Collection Time: 09/30/15  1:38 PM  Result Value Ref Range   WBC 32.7 (H) 3.6 - 11.0 K/uL   RBC 5.12 3.80 - 5.20 MIL/uL   Hemoglobin 15.0 12.0 - 16.0 g/dL   HCT 40.9 81.1 - 91.4 %   MCV 89.6 80.0 - 100.0 fL  MCH 29.2 26.0 - 34.0 pg   MCHC 32.6 32.0 - 36.0 g/dL   RDW 40.9 81.1 - 91.4 %   Platelets 376 150 - 440 K/uL  Basic metabolic panel     Status: Abnormal   Collection Time: 09/30/15  1:38 PM  Result Value Ref Range   Sodium 134 (L) 135 - 145 mmol/L   Potassium 4.4 3.5 - 5.1 mmol/L   Chloride 96 (L) 101 - 111 mmol/L   CO2 23 22 - 32 mmol/L   Glucose, Bld 167 (H) 65 - 99 mg/dL   BUN 12 6 - 20 mg/dL   Creatinine, Ser 7.82 0.44 - 1.00 mg/dL   Calcium 9.4 8.9 - 95.6 mg/dL   GFR calc non Af Amer >60 >60 mL/min   GFR calc Af Amer >60 >60 mL/min   Anion gap 15 5 - 15  Troponin I     Status: None   Collection Time: 09/30/15  1:38 PM  Result Value Ref Range   Troponin I 0.03 <0.031 ng/mL   Dg Chest Port 1 View  09/30/2015   CLINICAL  DATA:  Severe shortness of breath, COPD, hypertension, smoker  EXAM: PORTABLE CHEST 1 VIEW  COMPARISON:  Portable exam 1427 hours compared to 09/25/2015  FINDINGS: External pacing leads project over chest.  Normal heart size, mediastinal contours and pulmonary vascularity.  Hyperinflation and significant emphysematous changes compatible with COPD.  LEFT upper lobe infiltrate compatible with pneumonia.  Remaining lungs clear.  No definite pleural effusion or pneumothorax.  IMPRESSION: COPD changes with LEFT upper lobe pneumonia.   Electronically Signed   By: Ulyses Southward M.D.   On: 09/30/2015 14:35     ASSESSMENT AND PLAN: Atypical chest pain with possible pneumonia with elevated white count and sinus tachycardia with occasional SVT. Continue IV amiodarone and rule out myocardial infarction and treat underlying problem which is probably pneumonia with such severe elevated white count. Will manage the patient closely with you.  Ayrabella Labombard A

## 2015-09-30 NOTE — ED Notes (Signed)
MD notified of critical trop of 0.05

## 2015-09-30 NOTE — ED Notes (Addendum)
Attempted to call report at 748pm; UC stated that they were having issues with staffing and was waiting to hear from the Nursing Supervisor before accepting the pt. This RN provided the UC with ASCOM number for follow-up information/directions.

## 2015-09-30 NOTE — ED Notes (Signed)
12 mg adenosine given, MD at bedside, HR 130

## 2015-09-30 NOTE — ED Notes (Signed)
Pt husband at bedside, pt resting in bed quietly, pt eyes closed

## 2015-09-30 NOTE — ED Notes (Signed)
MD at bedside, HR 150s, 6 mg adenosine given, pt HR remained in the 140s, ST, pt on 02 4L [place], code cart at bedside, EKG machine at bedside

## 2015-09-30 NOTE — ED Notes (Signed)
Respirations even and unlabored, pt resting in bed, pt assisted to use bed pan

## 2015-09-30 NOTE — ED Provider Notes (Signed)
[facility] Emergency Department Provider Note REMINDER - THIS NOTE IS NOT A FINAL MEDICAL RECORD UNTIL IT IS SIGNED. UNTIL THEN, THE CONTENT BELOW MAY REFLECT INFORMATION FROM A DOCUMENTATION TEMPLATE, NOT THE ACTUAL PATIENT VISIT. ____________________________________________  Time seen: Approximately 3:41 PM  I have reviewed the triage vital signs and the nursing notes.   HISTORY  Chief Complaint Shortness of Breath and Chest Pain   EM caveat, history exam and review of systems are limited by patient's severe dyspnea and chest pain, she states she is in too much distress to speak much.  HPI Ashley [name] is a 59 y.o. female history of COPD and recent admission for left-sided chest pain.  Patient reports severe left-sided chest pain for one week. She feels as though her heart is "racing".  Past Medical History  Diagnosis Date  . COPD (chronic obstructive pulmonary disease) (HCC)   . Hypertension   . Anxiety     Patient Active Problem List   Diagnosis Date Noted  . Chest pain 09/22/2015    Past Surgical History  Procedure Laterality Date  . Abdominal hysterectomy      Current Outpatient Rx  Name  Route  Sig  Dispense  Refill  . albuterol (PROAIR HFA) 108 (90 BASE) MCG/ACT inhaler   Inhalation   Inhale 2 puffs into the lungs every 6 (six) hours as needed for wheezing or shortness of breath.          Marland Kitchen aspirin EC 81 MG tablet   Oral   Take 81 mg by mouth daily.          . Fluticasone-Salmeterol (ADVAIR DISKUS) 500-50 MCG/DOSE AEPB   Inhalation   Inhale 1 puff into the lungs 2 (two) times daily.          Marland Kitchen ipratropium-albuterol (DUONEB) 0.5-2.5 (3) MG/3ML SOLN   Inhalation   Inhale 3 mLs into the lungs 4 (four) times daily as needed (for shortness of breath and/or wheezing).          Marland Kitchen tiotropium (SPIRIVA) 18 MCG inhalation capsule   Inhalation   Place 1 capsule into inhaler and inhale daily.         Marland Kitchen azithromycin (ZITHROMAX)  250 MG tablet      Take as directed Patient not taking: Reported on 09/30/2015   6 each   0   . oxyCODONE (OXY IR/ROXICODONE) 5 MG immediate release tablet   Oral   Take 1 tablet (5 mg total) by mouth every 6 (six) hours as needed for moderate pain. Patient not taking: Reported on 09/30/2015   25 tablet   0   . predniSONE (DELTASONE) 20 MG tablet   Oral   Take 2 tablets (40 mg total) by mouth daily. Patient not taking: Reported on 09/30/2015   10 tablet   0     Allergies Review of patient's allergies indicates no known allergies.  Family History  Problem Relation Age of Onset  . CAD Mother   . CAD Father   . Cancer - Prostate Brother   . Cancer - Lung Brother     Social History Social History  Substance Use Topics  . Smoking status: Current Every Day Smoker -- 0.50 packs/day    Types: Cigarettes  . Smokeless tobacco: None  . Alcohol Use: No    Review of Systems No abdominal pain or vomiting. Denies fever. States severe left-sided chest pain and racing heart. ____________________________________________   PHYSICAL EXAM:  VITAL SIGNS: ED Triage  Vitals  Enc Vitals Group     BP 09/30/15 1341 120/86 mmHg     Pulse Rate 09/30/15 1341 150     Resp 09/30/15 1341 30     Temp 09/30/15 1341 99.6 F (37.6 C)     Temp Source 09/30/15 1341 Oral     SpO2 09/30/15 1334 97 %     Weight 09/30/15 1341 70 lb (31.752 kg)     Height 09/30/15 1341 5\' 2"  (1.575 m)     Head Cir --      Peak Flow --      Pain Score 09/30/15 1344 9     Pain Loc --      Pain Edu? --      Excl. in GC? --    Constitutional: Alert and oriented. Sitting upright, moderate distress with use of accessory muscles.  Eyes: Conjunctivae are normal. PERRL. EOMI. Head: Atraumatic. Nose: No congestion/rhinnorhea. Mouth/Throat: Mucous membranes are moist.  Oropharynx non-erythematous. Neck: No stridor.   Cardiovascular: Tachycardic rate, regular rhythm. Grossly normal heart sounds.  Good peripheral  circulation. Respiratory: Tachypnea, with mild use of accessory muscles. She does not show any signs of fatigue. The patient is able to speak in fairly full sentences, but only does so occasionally. I suspect that she is very anxious.  Gastrointestinal: Soft and nontender. No distention. No abdominal bruits. No CVA tenderness. Musculoskeletal: No lower extremity tenderness nor edema.  No joint effusions. Neurologic:  Pressured speech very anxious appearing. Skin:  Skin is warm, dry and intact. No rash noted. Psychiatric: Mood and affect are anxious and elevated. ____________________________________________   LABS (all labs ordered are listed, but only abnormal results are displayed)  Labs Reviewed  CBC - Abnormal; Notable for the following:    WBC 32.7 (*)    All other components within normal limits  BASIC METABOLIC PANEL - Abnormal; Notable for the following:    Sodium 134 (*)    Chloride 96 (*)    Glucose, Bld 167 (*)    All other components within normal limits  LACTIC ACID, PLASMA - Abnormal; Notable for the following:    Lactic Acid, Venous 2.3 (*)    All other components within normal limits  CULTURE, BLOOD (ROUTINE X 2)  CULTURE, BLOOD (ROUTINE X 2)  TROPONIN I  LACTIC ACID, PLASMA   ____________________________________________  EKG  Reviewed and interpreted by me Ventricular rate 150 QTc 440 QRS 65 Reviewed and interpreted as supraventricular tachycardia  Repeat EKG performed at 1405 Demonstrates supraventricular tachycardia Hyperacute T waves QTc 460 QRS 80  Rhythm strip at 1400 Interpreted as sinus tachycardia ____________________________________________  RADIOLOGY      DG Chest Port 1 View (Final result) Result time: 09/30/15 14:35:38   Final result by Rad Results In Interface (09/30/15 14:35:38)   Narrative:   CLINICAL DATA: Severe shortness of breath, COPD, hypertension, smoker  EXAM: PORTABLE CHEST 1 VIEW  COMPARISON: Portable exam 1427  hours compared to 09/25/2015  FINDINGS: External pacing leads project over chest.  Normal heart size, mediastinal contours and pulmonary vascularity.  Hyperinflation and significant emphysematous changes compatible with COPD.  LEFT upper lobe infiltrate compatible with pneumonia.  Remaining lungs clear.  No definite pleural effusion or pneumothorax.  IMPRESSION: COPD changes with LEFT upper lobe pneumonia.    ____________________________________________   PROCEDURES  Procedure(s) performed: None  Critical Care performed: Yes, see critical care note(s)   CRITICAL CARE Performed by: Sharyn Creamer   Total critical care time: 45  Critical care  time was exclusive of separately billable procedures and treating other patients.  Critical care was necessary to treat or prevent imminent or life-threatening deterioration.  Critical care was time spent personally by me on the following activities: development of treatment plan with patient and/or surrogate as well as nursing, discussions with consultants, evaluation of patient's response to treatment, examination of patient, obtaining history from patient or surrogate, ordering and performing treatments and interventions, ordering and review of laboratory studies, ordering and review of radiographic studies, pulse oximetry and re-evaluation of patient's condition.   ____________________________________________   INITIAL IMPRESSION / ASSESSMENT AND PLAN / ED COURSE  Pertinent labs & imaging results that were available during my care of the patient were reviewed by me and considered in my medical decision making (see chart for details).  Patient presents with tachycardia, dyspnea. Associated left-sided chest pain. The patient received a a adenosine twice with improvement to sinus tachycardia, however very little improvement in rate. Chest x-ray is notable for what appears to be a large left-sided infiltrate consistent with probable  pneumonia which is likely causing her left-sided chest pain. She had a previous CT angiography for similar symptomatology did not show an infarct or embolism partially one week ago. Patient has been seen by cardiology would advise his amiodarone infusion. Started the patient on antibiotic's. She is hemodynamically slowly improving, I will hydrate her. Blood cultures drawn antibiotic's order. Patient being admitted by the hospitalist service for ongoing care. ____________________________________________   FINAL CLINICAL IMPRESSION(S) / ED DIAGNOSES  Final diagnoses:  Healthcare-associated pneumonia  Sepsis, due to unspecified organism [facility])  Supraventricular tachycardia by ECG (HCC)  Left sided chest pain      Sharyn Creamer, MD 09/30/15 1548

## 2015-09-30 NOTE — ED Notes (Signed)
Pt placed on 3L [place], pt resting in bed quietly, eyes closed, respirations even and unlabored

## 2015-09-30 NOTE — ED Notes (Signed)
Pt placed on 2L , pt resting in bed quietly in no distress, eyes closed

## 2015-10-01 ENCOUNTER — Inpatient Hospital Stay
Admit: 2015-10-01 | Discharge: 2015-10-01 | Disposition: A | Discharge status: Home / Self Care | Payer: Self-pay | Attending: Internal Medicine | Admitting: Internal Medicine

## 2015-10-01 DIAGNOSIS — L8990 Pressure ulcer of unspecified site, unspecified stage: Secondary | ICD-10-CM | POA: Insufficient documentation

## 2015-10-01 LAB — CBC
HCT: 37 % (ref 35.0–47.0)
Hemoglobin: 12.3 g/dL (ref 12.0–16.0)
MCH: 29.4 pg (ref 26.0–34.0)
MCHC: 33.1 g/dL (ref 32.0–36.0)
MCV: 88.8 fL (ref 80.0–100.0)
Platelets: 333 10*3/uL (ref 150–440)
RBC: 4.17 MIL/uL (ref 3.80–5.20)
RDW: 12.5 % (ref 11.5–14.5)
WBC: 15.9 10*3/uL — AB (ref 3.6–11.0)

## 2015-10-01 LAB — BASIC METABOLIC PANEL
Anion gap: 4 — ABNORMAL LOW (ref 5–15)
BUN: 12 mg/dL (ref 6–20)
CALCIUM: 8.5 mg/dL — AB (ref 8.9–10.3)
CO2: 29 mmol/L (ref 22–32)
CREATININE: 0.57 mg/dL (ref 0.44–1.00)
Chloride: 103 mmol/L (ref 101–111)
Glucose, Bld: 147 mg/dL — ABNORMAL HIGH (ref 65–99)
Potassium: 3.9 mmol/L (ref 3.5–5.1)
SODIUM: 136 mmol/L (ref 135–145)

## 2015-10-01 LAB — LIPID PANEL
CHOL/HDL RATIO: 3.9 ratio
CHOLESTEROL: 102 mg/dL (ref 0–200)
HDL: 26 mg/dL — AB (ref >40)
LDL Cholesterol: 66 mg/dL (ref 0–99)
Triglycerides: 49 mg/dL (ref <150)
VLDL: 10 mg/dL (ref 0–40)

## 2015-10-01 LAB — TROPONIN I: Troponin I: 0.06 ng/mL — ABNORMAL HIGH (ref <0.031)

## 2015-10-01 MED ORDER — METHYLPREDNISOLONE SODIUM SUCC 40 MG IJ SOLR
40.0000 mg | Freq: Two times a day (BID) | INTRAMUSCULAR | Status: DC
  Administered 2015-10-01 – 2015-10-02 (×2): 40 mg via INTRAVENOUS
  Filled 2015-10-01 (×2): qty 1

## 2015-10-01 MED ORDER — NYSTATIN 100000 UNIT/ML MT SUSP
5.0000 mL | Freq: Four times a day (QID) | OROMUCOSAL | Status: DC
Start: 2015-10-01 — End: 2015-10-05
  Administered 2015-10-01 – 2015-10-04 (×14): 500000 [IU] via ORAL
  Filled 2015-10-01 (×13): qty 5

## 2015-10-01 MED ORDER — AZITHROMYCIN 250 MG PO TABS
250.0000 mg | ORAL_TABLET | Freq: Every day | ORAL | Status: DC
  Administered 2015-10-01 – 2015-10-02 (×2): 250 mg via ORAL
  Filled 2015-10-01 (×2): qty 1

## 2015-10-01 MED ORDER — ENSURE ENLIVE PO LIQD
237.0000 mL | Freq: Three times a day (TID) | ORAL | Status: DC
  Administered 2015-10-01 – 2015-10-05 (×10): 237 mL via ORAL

## 2015-10-01 MED ORDER — AMIODARONE HCL 200 MG PO TABS
400.0000 mg | ORAL_TABLET | Freq: Two times a day (BID) | ORAL | Status: DC
  Administered 2015-10-01: 400 mg via ORAL
  Filled 2015-10-01: qty 2

## 2015-10-01 NOTE — Progress Notes (Signed)
Initial Nutrition Assessment  DOCUMENTATION CODES:   Severe malnutrition in context of chronic illness  INTERVENTION:   Meals and Snacks: Cater to patient preferences; recommend smaller, more frequent meals. Ensure ordered between meals at present Medical Food Supplement Therapy: Ensure Enlive po TID between meals, each supplement provides 350 kcal and 20 grams of protein Education: pt was educated on recent admission on "High Calorie-High Protein" Nutrition Therapy and was to increase kcal intake without necessarily increasing amount of foods eaten (ie adding gravy or butter to foods, etc). Will reinforce on follow-up  NUTRITION DIAGNOSIS:   Malnutrition related to chronic illness as evidenced by severe depletion of muscle mass, severe depletion of body fat.  GOAL:   Patient will meet greater than or equal to 90% of their needs   MONITOR:    (Energy Intake, Anthropometrics, Electrolyte./Renal Profile, Digestive System)  REASON FOR ASSESSMENT:   Malnutrition Screening Tool    ASSESSMENT:    Pt admitted with chest pain, SOB; pt with SVT, LUL pneumonia; noted oral thrush per MD notes, started on nystatin  Past Medical History  Diagnosis Date  . COPD (chronic obstructive pulmonary disease) (HCC)   . Hypertension   . Anxiety     Diet Order:  Diet 2 gram sodium Room service appropriate?: Yes; Fluid consistency:: Thin   Energy intake: no recorded po intake, eating small amounts per pt  Food and Nutrition related History: Pt reports eating very small amounts of food multiple times a day. Pt reports back in March having a stomach bug that lasted 3 months where she lost weight and could not keep foods down. Since then pt has had difficulty eating enough to gain weight back. Pt does report drinking Ensure PTA.   Nutrition Focused Physical Exam: Nutrition-Focused physical exam completed. Findings are severe fat depletion, severe muscle depletion, and no edema.   Skin:   (stage  I sacrum)  Digestive System: oral thrush, nystatin ordered  Electrolyte and Renal Profile:  Recent Labs Lab 09/25/15 0746 09/30/15 1338 10/01/15 0239  BUN CREATININE 0.51 0.74 0.57  NA 135 134* 136  K 3.3* 4.4 3.9   Meds: solumedrol  Height:   Ht Readings from Last 1 Encounters:  09/30/15  (1.575 m)    Weight: no weight loss since last admission but no significant gains per pt  Wt Readings from Last 1 Encounters:  09/30/15 75 lb 4.8 oz (34.156 kg)    BMI:  Body mass index is 13.77 kg/(m^2).  Estimated Nutritional Needs:   Kcal:  1524-1801 kcals (BEE 1066, 1.1-1.3 IF, 1.3 AF)  Protein:  50-60 g (1.0-1.2 g/kg)   Fluid:  1250-1500 mL (25-30 ml/kG)   HIGH Care Level  Romelle Starcher MS, RD, LDN [phone] Pager

## 2015-10-01 NOTE — Progress Notes (Signed)
*  PRELIMINARY RESULTS* Echocardiogram 2D Echocardiogram has been performed.  [name] Stills 10/01/2015, 11:38 AM

## 2015-10-01 NOTE — Progress Notes (Signed)
2 L of oxygen. NSR. Bedpan. Pt reports no pain. Amio drip was d/c and PO started. Takes meds ok. ECHO showed ef 65%. Pt has no further concerns at this time.

## 2015-10-01 NOTE — Progress Notes (Signed)
SUBJECTIVE: Patient is feeling much better and denies any further chest pain but still feels short of breath. She has a pain in her IV site from infusion of IV amiodarone.   Filed Vitals:   10/01/15 0200 10/01/15 0303 10/01/15 0400 10/01/15 0525  BP: 95/59 102/54 94/53   Pulse: 66 67 63 68  Temp:    97.9 F (36.6 C)  TempSrc:    Oral  Resp:      Height:      Weight:      SpO2:    99%    Intake/Output Summary (Last 24 hours) at 10/01/15 0951 Last data filed at 10/01/15 0500  Gross per 24 hour  Intake 241.83 ml  Output    300 ml  Net -58.17 ml    LABS: Basic Metabolic Panel:  Recent Labs  16/10/96 1338 10/01/15 0239  NA 134* 136  K 4.4 3.9  CL 96* 103  CO2 23 29  GLUCOSE 167* 147*  BUN 12 12  CREATININE 0.74 0.57  CALCIUM 9.4 8.5*   Liver Function Tests: No results for input(s): AST, ALT, ALKPHOS, BILITOT, PROT, ALBUMIN in the last 72 hours. No results for input(s): LIPASE, AMYLASE in the last 72 hours. CBC:  Recent Labs  09/30/15 1338 10/01/15 0239  WBC 32.7* 15.9*  HGB 15.0 12.3  HCT 45.9 37.0  MCV 89.6 88.8  PLT 376 333   Cardiac Enzymes:  Recent Labs  09/30/15 1650 09/30/15 2137 10/01/15 0239  TROPONINI 0.05* 0.05* 0.06*   BNP: Invalid input(s): POCBNP D-Dimer: No results for input(s): DDIMER in the last 72 hours. Hemoglobin A1C: No results for input(s): HGBA1C in the last 72 hours. Fasting Lipid Panel:  Recent Labs  10/01/15 0239  CHOL 102  HDL 26*  LDLCALC 66  TRIG 49  CHOLHDL 3.9   Thyroid Function Tests: No results for input(s): TSH, T4TOTAL, T3FREE, THYROIDAB in the last 72 hours.  Invalid input(s): FREET3 Anemia Panel: No results for input(s): VITAMINB12, FOLATE, FERRITIN, TIBC, IRON, RETICCTPCT in the last 72 hours.   PHYSICAL EXAM General: Well developed, well nourished, in no acute distress HEENT:  Normocephalic and atramatic Neck:  No JVD.  Lungs: Clear bilaterally to auscultation and percussion. Heart: HRRR .  Normal S1 and S2 without gallops or murmurs.  Abdomen: Bowel sounds are positive, abdomen soft and non-tender  Msk:  Back normal, normal gait. Normal strength and tone for age. Extremities: No clubbing, cyanosis or edema.   Neuro: Alert and oriented X 3. Psych:  Good affect, responds appropriately  TELEMETRY: Monitor shows sinus rhythm at 70 bpm  ASSESSMENT AND PLAN: Supraventricular tachycardia with underlying pneumonia and atypical chest pain. Second set of troponin was 0.5 with initial troponin being negative. Advise changing amiodarone from IV to by mouth 800 a day. May need cardiac catheterization or outpatient stress test to rule out coronary artery disease at some point.  Principal Problem:   SVT (supraventricular tachycardia) (HCC) Active Problems:   Pneumonia   Pressure ulcer    Ashley [name] A, MD, [facility] 10/01/2015 9:51 AM

## 2015-10-01 NOTE — Consult Note (Signed)
Pulmonary Critical Care  Initial Consult Note   Ashley [name] WJX:[id number] DOB: 1956/02/22 DOA: 09/30/2015  Referring physician: Enid Baas, MD PCP: Ashley Halter, MD   Chief Complaint: Ashley [name]  HPI: Ashley [name] is a 59 y.o. female with prior history of COPD HTN presented to the hospital for shortness of breath and also with chest pain. Patient had been ill for about a week. Patient had been having increased cough. She was evalauted as an outpatient and treated with a zpk. She did not seem to be improving and so came into the ED. Patient also had been having severe chest pain nnoted. She had been having tachycardia presentation. A CXR done shows presence of LUL pneumonia. A CT from 9/29 did not show any infiltrates   Review of Systems:  Complete 12 point systems reviewed and is unremarkable other than HPI  Past Medical History  Diagnosis Date  . COPD (chronic obstructive pulmonary disease) (HCC)   . Hypertension   . Anxiety    Past Surgical History  Procedure Laterality Date  . Abdominal hysterectomy     Social History:  reports that she has been smoking Cigarettes.  She has been smoking about 0.50 packs per day. She does not have any smokeless tobacco history on file. She reports that she does not drink alcohol or use illicit drugs.  No Known Allergies  Family History  Problem Relation Age of Onset  . CAD Mother   . CAD Father   . Cancer - Prostate Brother   . Cancer - Lung Brother     Prior to Admission medications   Medication Sig Start Date End Date Taking? Authorizing Provider  albuterol (PROAIR HFA) 108 (90 BASE) MCG/ACT inhaler Inhale 2 puffs into the lungs every 6 (six) hours as needed for wheezing or shortness of breath.    Yes Historical Provider, MD  aspirin EC 81 MG tablet Take 81 mg by mouth daily.    Yes Historical Provider, MD  Fluticasone-Salmeterol (ADVAIR DISKUS) 500-50 MCG/DOSE AEPB Inhale 1 puff into the lungs 2 (two) times daily.    Yes  Historical Provider, MD  ipratropium-albuterol (DUONEB) 0.5-2.5 (3) MG/3ML SOLN Inhale 3 mLs into the lungs 4 (four) times daily as needed (for shortness of breath and/or wheezing).    Yes Historical Provider, MD  tiotropium (SPIRIVA) 18 MCG inhalation capsule Place 1 capsule into inhaler and inhale daily.   Yes Historical Provider, MD  azithromycin (ZITHROMAX) 250 MG tablet Take as directed Patient not taking: Reported on 09/30/2015 09/24/15   Ashley Finner, MD  oxyCODONE (OXY IR/ROXICODONE) 5 MG immediate release tablet Take 1 tablet (5 mg total) by mouth every 6 (six) hours as needed for moderate pain. Patient not taking: Reported on 09/30/2015 09/24/15   Ashley Finner, MD  predniSONE (DELTASONE) 20 MG tablet Take 2 tablets (40 mg total) by mouth daily. Patient not taking: Reported on 09/30/2015 09/25/15   Ashley Antis, MD   Physical Exam: Filed Vitals:   10/01/15 0303 10/01/15 0400 10/01/15 0525 10/01/15 1235  BP: 102/54 94/53  114/53  Pulse: 67 63 68 86  Temp:   97.9 F (36.6 C) 98.2 F (36.8 C)  TempSrc:   Oral Oral  Resp:    26  Height:      Weight:      SpO2:   99% 94%    Wt Readings from Last 3 Encounters:  09/30/15 34.156 kg (75 lb 4.8 oz)  09/22/15 32.614 kg (71 lb 14.4 oz)  07/31/15  34.02 kg (75 lb)    General:  Appears calm and comfortable Eyes: PERRL, normal lids, irises & conjunctiva ENT: grossly normal hearing, lips & tongue Neck: no LAD, masses or thyromegaly Cardiovascular: RRR, no m/r/g. No LE edema. Respiratory: +ronchi noted L>R Abdomen: soft, nontender Skin: no rash or induration seen on limited exam Musculoskeletal: grossly normal tone BUE/BLE Psychiatric: grossly normal mood and affect Neurologic: grossly non-focal.          Labs on Admission:  Basic Metabolic Panel:  Recent Labs Lab 09/25/15 0746 09/30/15 1338 10/01/15 0239  NA 135 134* 136  K 3.3* 4.4 3.9  CL 99* 96* 103  CO2 GLUCOSE 135* 167* 147*  BUN CREATININE 0.51  0.74 0.57  CALCIUM 8.8* 9.4 8.5*   Liver Function Tests:  Recent Labs Lab 09/25/15 0746  AST 15  ALT 9*  ALKPHOS 80  BILITOT 0.8  PROT 6.7  ALBUMIN 3.3*   No results for input(s): LIPASE, AMYLASE in the last 168 hours. No results for input(s): AMMONIA in the last 168 hours. CBC:  Recent Labs Lab 09/25/15 0746 09/30/15 1338 10/01/15 0239  WBC 12.9* 32.7* 15.9*  HGB 13.8 15.0 12.3  HCT 41.9 45.9 37.0  MCV 89.7 89.6 88.8  PLT 286 376 333   Cardiac Enzymes:  Recent Labs Lab 09/25/15 0746 09/30/15 1338 09/30/15 1650 09/30/15 2137 10/01/15 0239  TROPONINI <0.03 0.03 0.05* 0.05* 0.06*    BNP (last 3 results) No results for input(s): BNP in the last 8760 hours.  ProBNP (last 3 results) No results for input(s): PROBNP in the last 8760 hours.  CBG: No results for input(s): GLUCAP in the last 168 hours.  Radiological Exams on Admission: Dg Chest Port 1 View  09/30/2015   CLINICAL DATA:  Severe shortness of breath, COPD, hypertension, smoker  EXAM: PORTABLE CHEST 1 VIEW  COMPARISON:  Portable exam 1427 hours compared to 09/25/2015  FINDINGS: External pacing leads project over chest.  Normal heart size, mediastinal contours and pulmonary vascularity.  Hyperinflation and significant emphysematous changes compatible with COPD.  LEFT upper lobe infiltrate compatible with pneumonia.  Remaining lungs clear.  No definite pleural effusion or pneumothorax.  IMPRESSION: COPD changes with LEFT upper lobe pneumonia.   Electronically Signed   By: Ashley [name] M.D.   On: 09/30/2015 14:35      CXR from 10/7   CXR from 9/29  EKG: Independently reviewed.  Assessment/Plan Principal Problem:   SVT (supraventricular tachycardia) (HCC) Active Problems:   Pneumonia   Pressure ulcer   1. Left Lobar Pneumonia -patient is currently on rocephin will be continued as well as azithromycin -check sputum cultures -Follow up blood cultures -patient will need follow up CXR to  demonstrate clearing of the infiltrate  2. COPD with exacerbation -continue with inhalers -Agree with steroids  3. Tobacco use -smoking cessation counseling to be provided  Code Status: full code(must indicate code status--if unknown or must be presumed, indicate so) DVT Prophylaxis:heparin Family Communication: none (indicate person spoken with, if applicable, with phone number if by telephone) Disposition Plan: home (indicate anticipated LOS)      I have personally obtained a history, examined the patient, evaluated laboratory and imaging results, formulated the assessment and plan and placed orders.  The Patient requires high complexity decision making for assessment and support.    Yevonne Pax, MD [facility] Pulmonary Critical Care Medicine Sleep Medicine

## 2015-10-01 NOTE — Progress Notes (Signed)
[facility] -  at [facility]   PATIENT NAME: Ashley [name]    MR#:  [id number]  DATE OF BIRTH:  [birth year]  SUBJECTIVE:  CHIEF COMPLAINT:   Chief Complaint  Patient presents with  . Shortness of Breath  . Chest Pain   -Patient admitted for tachycardia and also left-sided chest pain. -Heart rate better controlled on amiodarone drip. -Chest x-ray with left upper lobe pneumonia  REVIEW OF SYSTEMS:  Review of Systems  Constitutional: Negative for fever and chills.  HENT: Negative for ear discharge, ear pain and nosebleeds.   Eyes: Negative for blurred vision and double vision.  Respiratory: Positive for shortness of breath. Negative for cough and wheezing.   Cardiovascular: Positive for chest pain. Negative for palpitations, orthopnea and PND.  Gastrointestinal: Negative for nausea, vomiting, abdominal pain, diarrhea and constipation.  Genitourinary: Negative for dysuria.  Neurological: Negative for dizziness, sensory change, speech change, focal weakness, seizures, weakness and headaches.  Psychiatric/Behavioral: Negative for depression. The patient is nervous/anxious.     DRUG ALLERGIES:  No Known Allergies  VITALS:  Blood pressure 94/53, pulse 68, temperature 97.9 F (36.6 C), temperature source Oral, resp. rate 17, height  (1.575 m), weight 34.156 kg (75 lb 4.8 oz), SpO2 99 %.  PHYSICAL EXAMINATION:  Physical Exam  GENERAL:  59 y.o.-year-old patient lying in the bed with no acute distress.  EYES: Pupils equal, round, reactive to light and accommodation. No scleral icterus. Extraocular muscles intact.  HEENT: Head atraumatic, normocephalic. Oropharynx and nasopharynx clear. Oral thrush noted on the tongue. NECK:  Supple, no jugular venous distention. No thyroid enlargement, no tenderness.  LUNGS: Normal breath sounds bilaterally, no rales,rhonchi or crepitation. No use of accessory muscles of respiration. Scattered expiratory wheezing  noted on exam CARDIOVASCULAR: S1, S2 normal. No murmurs, rubs, or gallops.  ABDOMEN: Soft, nontender, nondistended. Bowel sounds present. No organomegaly or mass.  EXTREMITIES: No pedal edema, cyanosis, or clubbing.  NEUROLOGIC: Cranial nerves II through XII are intact. Muscle strength 5/5 in all extremities. Sensation intact. Gait not checked.  PSYCHIATRIC: The patient is alert and oriented x 3.  SKIN: No obvious rash, lesion, or ulcer.    LABORATORY PANEL:   CBC  Recent Labs Lab 10/01/15 0239  WBC 15.9*  HGB 12.3  HCT 37.0  PLT 333   ------------------------------------------------------------------------------------------------------------------  Chemistries   Recent Labs Lab 09/25/15 0746  10/01/15 0239  NA 135  < > 136  K 3.3*  < > 3.9  CL 99*  < > 103  CO2 27  < > 29  GLUCOSE 135*  < > 147*  BUN 8  < > 12  CREATININE 0.51  < > 0.57  CALCIUM 8.8*  < > 8.5*  AST 15  --   --   ALT 9*  --   --   ALKPHOS 80  --   --   BILITOT 0.8  --   --   < > = values in this interval not displayed. ------------------------------------------------------------------------------------------------------------------  Cardiac Enzymes  Recent Labs Lab 10/01/15 0239  TROPONINI 0.06*   ------------------------------------------------------------------------------------------------------------------  RADIOLOGY:  Dg Chest Port 1 View  09/30/2015   CLINICAL DATA:  Severe shortness of breath, COPD, hypertension, smoker  EXAM: PORTABLE CHEST 1 VIEW  COMPARISON:  Portable exam 1427 hours compared to 09/25/2015  FINDINGS: External pacing leads project over chest.  Normal heart size, mediastinal contours and pulmonary vascularity.  Hyperinflation and significant emphysematous changes compatible with COPD.  LEFT upper lobe infiltrate  compatible with pneumonia.  Remaining lungs clear.  No definite pleural effusion or pneumothorax.  IMPRESSION: COPD changes with LEFT upper lobe pneumonia.    Electronically Signed   By: Ulyses Southward M.D.   On: 09/30/2015 14:35    EKG:   Orders placed or performed during the hospital encounter of 09/30/15  . EKG 12-Lead  . EKG 12-Lead  . EKG 12-Lead  . EKG 12-Lead  . EKG 12-Lead  . EKG 12-Lead    ASSESSMENT AND PLAN:   59 year old female with past medical history significant for COPD, recent admission for bronchitis last week comes back to the emergency room with chest pain and also noted to be tachycardic  #1 left upper lobe pneumonia-its new compared to recent chest x-ray and also CT chest done last week. -Has significant pleuritic chest pain. -Follow up blood cultures. Continue Rocephin and azithromycin for now. -Improving oxygen requirements. Her pain is improving as well.  #2 supraventricular tachycardia-responding to amiodarone drip. Change to oral amiodarone today. -Echocardiogram done. -Cardiology consultation is appreciated -Borderline elevated troponin. No active ischemia at this time. Follow up with outpatient stress test  #3 COPD-mild exacerbation. -Continue duo nebs and also decrease her IV steroids at this time  #4 oral thrush-started nystatin  #5 DVT prophylaxis-on Lovenox  All the records are reviewed and case discussed with Care Management/Social Workerr. Management plans discussed with the patient, family and they are in agreement.  CODE STATUS: Full code  TOTAL TIME TAKING CARE OF THIS PATIENT: 28 minutes.   POSSIBLE D/C IN 2 DAYS, DEPENDING ON CLINICAL CONDITION.   Salvatore Poe M.D on 10/01/2015 at 12:14 PM  Between 7am to 6pm - Pager - [phone]  After 6pm go to [email or web address] - password [email or web address] [facility]  [place] [facility]  Office  [phone]  CC: Primary care physician; Suan Halter, MD

## 2015-10-01 NOTE — Progress Notes (Signed)
   10/01/15 1020  Clinical Encounter Type  Visited With Patient  Visit Type Initial  Consult/Referral To Chaplain  Spiritual Encounters  Spiritual Needs Emotional;Prayer  Stress Factors  Patient Stress Factors Exhausted;Health changes  Met w/patient and provided pastoral care & prayer. Chap. Kedron Uno G. [place]

## 2015-10-02 DIAGNOSIS — E43 Unspecified severe protein-calorie malnutrition: Secondary | ICD-10-CM | POA: Insufficient documentation

## 2015-10-02 DIAGNOSIS — E46 Unspecified protein-calorie malnutrition: Secondary | ICD-10-CM

## 2015-10-02 LAB — CBC
HCT: 33.8 % — ABNORMAL LOW (ref 35.0–47.0)
HEMOGLOBIN: 11.2 g/dL — AB (ref 12.0–16.0)
MCH: 29.4 pg (ref 26.0–34.0)
MCHC: 33.1 g/dL (ref 32.0–36.0)
MCV: 88.7 fL (ref 80.0–100.0)
PLATELETS: 368 10*3/uL (ref 150–440)
RBC: 3.82 MIL/uL (ref 3.80–5.20)
RDW: 12.6 % (ref 11.5–14.5)
WBC: 19.7 10*3/uL — AB (ref 3.6–11.0)

## 2015-10-02 LAB — BASIC METABOLIC PANEL
ANION GAP: 5 (ref 5–15)
BUN: 20 mg/dL (ref 6–20)
CHLORIDE: 103 mmol/L (ref 101–111)
CO2: 32 mmol/L (ref 22–32)
CREATININE: 0.61 mg/dL (ref 0.44–1.00)
Calcium: 8.8 mg/dL — ABNORMAL LOW (ref 8.9–10.3)
GFR calc non Af Amer: >60 mL/min (ref >60)
Glucose, Bld: 155 mg/dL — ABNORMAL HIGH (ref 65–99)
POTASSIUM: 4.1 mmol/L (ref 3.5–5.1)
SODIUM: 140 mmol/L (ref 135–145)

## 2015-10-02 LAB — C DIFFICILE QUICK SCREEN W PCR REFLEX
C DIFFICILE (CDIFF) INTERP: NEGATIVE
C DIFFICLE (CDIFF) ANTIGEN: NEGATIVE
C Diff toxin: NEGATIVE

## 2015-10-02 MED ORDER — PREDNISONE 50 MG PO TABS
50.0000 mg | ORAL_TABLET | Freq: Every day | ORAL | Status: DC
  Administered 2015-10-03 – 2015-10-04 (×2): 50 mg via ORAL
  Filled 2015-10-02 (×2): qty 1

## 2015-10-02 MED ORDER — AMIODARONE HCL 200 MG PO TABS
200.0000 mg | ORAL_TABLET | Freq: Two times a day (BID) | ORAL | Status: DC
  Administered 2015-10-02 – 2015-10-05 (×7): 200 mg via ORAL
  Filled 2015-10-02 (×7): qty 1

## 2015-10-02 MED ORDER — DOXYCYCLINE HYCLATE 100 MG PO TABS
100.0000 mg | ORAL_TABLET | Freq: Two times a day (BID) | ORAL | Status: DC
  Administered 2015-10-02 – 2015-10-03 (×2): 100 mg via ORAL
  Filled 2015-10-02 (×2): qty 1

## 2015-10-02 MED ORDER — LOPERAMIDE HCL 2 MG PO CAPS
2.0000 mg | ORAL_CAPSULE | Freq: Four times a day (QID) | ORAL | Status: DC | PRN
  Administered 2015-10-02: 2 mg via ORAL
  Filled 2015-10-02: qty 1

## 2015-10-02 MED ORDER — CITALOPRAM HYDROBROMIDE 20 MG PO TABS
20.0000 mg | ORAL_TABLET | Freq: Every day | ORAL | Status: DC
  Administered 2015-10-02 – 2015-10-05 (×4): 20 mg via ORAL
  Filled 2015-10-02 (×4): qty 1

## 2015-10-02 NOTE — Progress Notes (Signed)
A & O. Up to BR and tolereated it well. Ambulated around the nurses station. Pt reports no pain. Family at the bedside. 2 L of oxygen. NSR. Takes meds ok. Pt has no further concerns at this time.

## 2015-10-02 NOTE — Progress Notes (Signed)
[facility] Phys[facility] - [place] at [facility]   PATIENT NAME: Ashley [name]    MR#:  [id number]  DATE OF BIRTH:  1956/04/10  SUBJECTIVE:  CHIEF COMPLAINT:   Chief Complaint  Patient presents with  . Shortness of Breath  . Chest Pain   -Had a nightmare this morning, appears very anxious and emotional. -Chest pain or breathing are improving  REVIEW OF SYSTEMS:  Review of Systems  Constitutional: Negative for fever and chills.  HENT: Negative for ear discharge, ear pain and nosebleeds.   Eyes: Negative for blurred vision and double vision.  Respiratory: Positive for shortness of breath. Negative for cough and wheezing.   Cardiovascular: Negative for chest pain, palpitations, orthopnea and PND.  Gastrointestinal: Negative for nausea, vomiting, abdominal pain, diarrhea and constipation.  Genitourinary: Negative for dysuria.  Neurological: Negative for dizziness, sensory change, speech change, focal weakness, seizures, weakness and headaches.  Psychiatric/Behavioral: Negative for depression. The patient is nervous/anxious.     DRUG ALLERGIES:  No Known Allergies  VITALS:  Blood pressure 93/60, pulse 61, temperature 97.5 F (36.4 C), temperature source Oral, resp. rate 18, height  (1.575 m), weight 34.156 kg (75 lb 4.8 oz), SpO2 93 %.  PHYSICAL EXAMINATION:  Physical Exam  GENERAL:  59 y.o.-year-old patient lying in the bed with no acute distress.  EYES: Pupils equal, round, reactive to light and accommodation. No scleral icterus. Extraocular muscles intact.  HEENT: Head atraumatic, normocephalic. Oropharynx and nasopharynx clear. Oral thrush noted on the tongue. NECK:  Supple, no jugular venous distention. No thyroid enlargement, no tenderness.  LUNGS: Normal breath sounds bilaterally, no rales,rhonchi or crepitation. No use of accessory muscles of respiration. Improved expiratory wheezing noted on exam CARDIOVASCULAR: S1, S2 normal. No murmurs, rubs, or  gallops.  ABDOMEN: Soft, nontender, nondistended. Bowel sounds present. No organomegaly or mass.  EXTREMITIES: No pedal edema, cyanosis, or clubbing.  NEUROLOGIC: Cranial nerves II through XII are intact. Muscle strength 5/5 in all extremities. Sensation intact. Gait not checked.  PSYCHIATRIC: The patient is alert and oriented x 3.  SKIN: No obvious rash, lesion, or ulcer.    LABORATORY PANEL:   CBC  Recent Labs Lab 10/02/15 0531  WBC 19.7*  HGB 11.2*  HCT 33.8*  PLT 368   ------------------------------------------------------------------------------------------------------------------  Chemistries   Recent Labs Lab 10/02/15 0531  NA 140  K 4.1  CL 103  CO2 32  GLUCOSE 155*  BUN 20  CREATININE 0.61  CALCIUM 8.8*   ------------------------------------------------------------------------------------------------------------------  Cardiac Enzymes  Recent Labs Lab 10/01/15 0239  TROPONINI 0.06*   ------------------------------------------------------------------------------------------------------------------  RADIOLOGY:  Dg Chest Port 1 View  09/30/2015   CLINICAL DATA:  Severe shortness of breath, COPD, hypertension, smoker  EXAM: PORTABLE CHEST 1 VIEW  COMPARISON:  Portable exam 1427 hours compared to 09/25/2015  FINDINGS: External pacing leads project over chest.  Normal heart size, mediastinal contours and pulmonary vascularity.  Hyperinflation and significant emphysematous changes compatible with COPD.  LEFT upper lobe infiltrate compatible with pneumonia.  Remaining lungs clear.  No definite pleural effusion or pneumothorax.  IMPRESSION: COPD changes with LEFT upper lobe pneumonia.   Electronically Signed   By: Ulyses Southward M.D.   On: 09/30/2015 14:35    EKG:   Orders placed or performed during the hospital encounter of 09/30/15  . EKG 12-Lead  . EKG 12-Lead  . EKG 12-Lead  . EKG 12-Lead  . EKG 12-Lead  . EKG 12-Lead    ASSESSMENT AND PLAN:  59 year old female with past medical history significant for COPD, recent admission for bronchitis last week comes back to the emergency room with chest pain and also noted to be tachycardic  #1 left upper lobe pneumonia-its new compared to recent chest x-ray and also CT chest done last week. -Has significant pleuritic chest pain. -Negative blood cultures. Continue Rocephin for now. Azithromycin changed to doxycycline due to high arrhythmia potential with amiodarone -Improving oxygen requirements.  #2 supraventricular tachycardia-decrease amiodarone oral dose. -Echocardiogram done with normal EF. -Cardiology consultation is appreciated -Borderline elevated troponin due to demand ischemia.  Follow up with outpatient stress test  #3 COPD-mild exacerbation. -Continue duo nebs and also decrease her IV steroids at this time  #4 oral thrush-started nystatin  #5 DVT prophylaxis-on Lovenox  #6 Anxiety- and depression likely- add celexa  All the records are reviewed and case discussed with Care Management/Social Workerr. Management plans discussed with the patient, family and they are in agreement.  CODE STATUS: Full code  TOTAL TIME TAKING CARE OF THIS PATIENT: 28 minutes.   POSSIBLE D/C TOMORROW, DEPENDING ON CLINICAL CONDITION.   Jacquelynn Friend M.D on 10/02/2015 at 9:31 AM  Between 7am to 6pm - Pager - [phone]  After 6pm go to [email or web address] - password [email or web address] [facility]  [place] [facility]  Office  [phone]  CC: Primary care physician; Suan Halter, MD

## 2015-10-02 NOTE — Progress Notes (Signed)
2 L of oxygen. No tele. Takes meds ok. No pain. R/o c diff. Po abx. AMbulated to BR and tolerated well. Report called to [facility] on 1C.

## 2015-10-02 NOTE — Progress Notes (Signed)
Pt is alert and oriented, no c/o pain. 2 L of oxygen continued at this time. Encouraged to call for assistance when needed. Resting quietly.

## 2015-10-02 NOTE — Progress Notes (Signed)
SUBJECTIVE: Patient is feeling much better no longer having SVT or sinus tachycardia. Eating breakfast and denies any further chest pain.   Filed Vitals:   10/01/15 1235 10/01/15 1951 10/02/15 0427 10/02/15 0750  BP: 114/53 99/50 95/52  93/60  Pulse: 86 68 73 61  Temp: 98.2 F (36.8 C) 98 F (36.7 C) 97.5 F (36.4 C)   TempSrc: Oral Oral Oral   Resp: Height:      Weight:      SpO2: 94% 99% 97% 93%    Intake/Output Summary (Last 24 hours) at 10/02/15 1610 Last data filed at 10/02/15 0428  Gross per 24 hour  Intake    100 ml  Output    900 ml  Net   -800 ml    LABS: Basic Metabolic Panel:  Recent Labs  96/04/54 0239 10/02/15 0531  NA 136 140  K 3.9 4.1  CL 103 103  CO2 29 32  GLUCOSE 147* 155*  BUN 12 20  CREATININE 0.57 0.61  CALCIUM 8.5* 8.8*   Liver Function Tests: No results for input(s): AST, ALT, ALKPHOS, BILITOT, PROT, ALBUMIN in the last 72 hours. No results for input(s): LIPASE, AMYLASE in the last 72 hours. CBC:  Recent Labs  10/01/15 0239 10/02/15 0531  WBC 15.9* 19.7*  HGB 12.3 11.2*  HCT 37.0 33.8*  MCV 88.8 88.7  PLT 333 368   Cardiac Enzymes:  Recent Labs  09/30/15 1650 09/30/15 2137 10/01/15 0239  TROPONINI 0.05* 0.05* 0.06*   BNP: Invalid input(s): POCBNP D-Dimer: No results for input(s): DDIMER in the last 72 hours. Hemoglobin A1C: No results for input(s): HGBA1C in the last 72 hours. Fasting Lipid Panel:  Recent Labs  10/01/15 0239  CHOL 102  HDL 26*  LDLCALC 66  TRIG 49  CHOLHDL 3.9   Thyroid Function Tests: No results for input(s): TSH, T4TOTAL, T3FREE, THYROIDAB in the last 72 hours.  Invalid input(s): FREET3 Anemia Panel: No results for input(s): VITAMINB12, FOLATE, FERRITIN, TIBC, IRON, RETICCTPCT in the last 72 hours.   PHYSICAL EXAM General: Well developed, well nourished, in no acute distress HEENT:  Normocephalic and atramatic Neck:  No JVD.  Lungs: Clear bilaterally to auscultation  and percussion. Heart: HRRR . Normal S1 and S2 without gallops or murmurs.  Abdomen: Bowel sounds are positive, abdomen soft and non-tender  Msk:  Back normal, normal gait. Normal strength and tone for age. Extremities: No clubbing, cyanosis or edema.   Neuro: Alert and oriented X 3. Psych:  Good affect, responds appropriately  TELEMETRY: Sinus rhythm  ASSESSMENT AND PLAN: Status post supraventricular tachycardia requiring adenosine and chest pain. There was mildly elevated troponin most likely due to demand ischemia. Echocardiogram showed normal left ventricle systolic function with normal wall motion. Advise discharged with follow-up on Thursday at 10:00. Will do outpatient Lexiscan Myoview.  Principal Problem:   SVT (supraventricular tachycardia) (HCC) Active Problems:   Pneumonia   Pressure ulcer   Protein-calorie malnutrition, severe    Mckynleigh Mussell A, MD, [facility] 10/02/2015 8:22 AM

## 2015-10-03 ENCOUNTER — Inpatient Hospital Stay: Payer: Self-pay

## 2015-10-03 MED ORDER — RISAQUAD PO CAPS
1.0000 | ORAL_CAPSULE | Freq: Two times a day (BID) | ORAL | Status: DC
Start: 2015-10-03 — End: 2015-10-05
  Administered 2015-10-03 – 2015-10-05 (×5): 1 via ORAL
  Filled 2015-10-03 (×5): qty 1

## 2015-10-03 MED ORDER — LEVOFLOXACIN 250 MG PO TABS
250.0000 mg | ORAL_TABLET | Freq: Every day | ORAL | Status: DC
  Administered 2015-10-04 – 2015-10-05 (×2): 250 mg via ORAL
  Filled 2015-10-03 (×2): qty 1

## 2015-10-03 MED ORDER — LEVOFLOXACIN 500 MG PO TABS: 500.0000 mg | ORAL_TABLET | Freq: Every day | ORAL | Status: DC

## 2015-10-03 MED ORDER — LEVOFLOXACIN 500 MG PO TABS
500.0000 mg | ORAL_TABLET | Freq: Once | ORAL | Status: AC
  Administered 2015-10-03: 500 mg via ORAL
  Filled 2015-10-03: qty 1

## 2015-10-03 MED ORDER — ENOXAPARIN SODIUM 60 MG/0.6ML ~~LOC~~ SOLN
40.0000 mg | SUBCUTANEOUS | Status: DC
  Administered 2015-10-03: 40 mg via SUBCUTANEOUS
  Filled 2015-10-03 (×2): qty 0.6

## 2015-10-03 NOTE — Progress Notes (Signed)
Pt tested negative for c-diff. Enteric prec d/c'd

## 2015-10-03 NOTE — Progress Notes (Signed)
[facility] [place] [facility]   PATIENT NAME: Ashley [name]    MR#:  [id number]  DATE OF BIRTH:  10-Oct-1956  SUBJECTIVE:  CHIEF COMPLAINT:   Chief Complaint  Patient presents with  . Shortness of Breath  . Chest Pain   -Breathing has improved, but significant diarrhea since yesterday morning. C. difficile is negative. -Some are formed stools up to 6 times a day. No abdominal pain. No nausea or vomiting  REVIEW OF SYSTEMS:  Review of Systems  Constitutional: Negative for fever and chills.  HENT: Negative for ear discharge, ear pain and nosebleeds.   Eyes: Negative for blurred vision and double vision.  Respiratory: Negative for cough, shortness of breath and wheezing.   Cardiovascular: Negative for chest pain, palpitations, orthopnea and PND.  Gastrointestinal: Positive for diarrhea. Negative for nausea, vomiting, abdominal pain and constipation.  Genitourinary: Negative for dysuria.  Neurological: Negative for dizziness, sensory change, speech change, focal weakness, seizures, weakness and headaches.  Psychiatric/Behavioral: Negative for depression. The patient is nervous/anxious.     DRUG ALLERGIES:  No Known Allergies  VITALS:  Blood pressure 127/61, pulse 58, temperature 97.4 F (36.3 C), temperature source Oral, resp. rate 18, height  (1.575 m), weight 34.156 kg (75 lb 4.8 oz), SpO2 97 %.  PHYSICAL EXAMINATION:  Physical Exam  GENERAL:  59 y.o.-year-old patient lying in the bed with no acute distress.  EYES: Pupils equal, round, reactive to light and accommodation. No scleral icterus. Extraocular muscles intact.  HEENT: Head atraumatic, normocephalic. Oropharynx and nasopharynx clear. Oral thrush noted on the tongue. NECK:  Supple, no jugular venous distention. No thyroid enlargement, no tenderness.  LUNGS: Normal breath sounds bilaterally, no rales,rhonchi or crepitation. No use of accessory muscles of respiration. No expiratory  wheezing noted on exam CARDIOVASCULAR: S1, S2 normal. No murmurs, rubs, or gallops.  ABDOMEN: Soft, nontender, nondistended. Bowel sounds present. No organomegaly or mass.  EXTREMITIES: No pedal edema, cyanosis, or clubbing.  NEUROLOGIC: Cranial nerves II through XII are intact. Muscle strength 5/5 in all extremities. Sensation intact. Gait not checked.  PSYCHIATRIC: The patient is alert and oriented x 3.  SKIN: No obvious rash, lesion, or ulcer.    LABORATORY PANEL:   CBC  Recent Labs Lab 10/02/15 0531  WBC 19.7*  HGB 11.2*  HCT 33.8*  PLT 368   ------------------------------------------------------------------------------------------------------------------  Chemistries   Recent Labs Lab 10/02/15 0531  NA 140  K 4.1  CL 103  CO2 32  GLUCOSE 155*  BUN 20  CREATININE 0.61  CALCIUM 8.8*   ------------------------------------------------------------------------------------------------------------------  Cardiac Enzymes  Recent Labs Lab 10/01/15 0239  TROPONINI 0.06*   ------------------------------------------------------------------------------------------------------------------  RADIOLOGY:  Dg Chest 2 View  10/03/2015   CLINICAL DATA:  History of pneumonia, COPD, smoking history  EXAM: CHEST  2 VIEW  COMPARISON:  Portable chest x-ray of 09/30/2015 and 09/25/2015  FINDINGS: There has been significant improvement in the left upper lobe pneumonia. Somewhat prominent markings remain in the left mid lower lung field most consistent with residual pneumonia. The right lung is clear. Both lungs are markedly hyperaerated consistent with severe emphysema. No effusion is seen. Heart size is stable. No bony abnormality is noted.  IMPRESSION: Significant improvement in the left upper lobe pneumonia with some opacity remaining consistent with residual pneumonia. Severe emphysema.   Electronically Signed   By: Dwyane Dee M.D.   On: 10/03/2015 08:03    EKG:   Orders placed  or performed during the hospital encounter  of 09/30/15  . EKG 12-Lead  . EKG 12-Lead  . EKG 12-Lead  . EKG 12-Lead  . EKG 12-Lead  . EKG 12-Lead    ASSESSMENT AND PLAN:   59 year old female with past medical history significant for COPD, recent admission for bronchitis last week comes back to the emergency room with chest pain and also noted to be tachycardic  #1 Left upper lobe pneumonia-its new compared to recent chest x-ray and also CT chest done last week. -Improving pleuritic chest pain. -Negative blood cultures.  -Improving oxygen requirements. - Chest x-ray today with improvement. Changed antibiotics to Levaquin orally  #2 supraventricular tachycardia-decrease amiodarone oral dose. -Echocardiogram done with normal EF. -Cardiology consultation is appreciated -Borderline elevated troponin due to demand ischemia.  Follow up with outpatient stress test  #3 COPD-mild exacerbation. -Continue duo nebs and also on prednisone - taper at discharge  #4 oral thrush-started nystatin  #5 DVT prophylaxis-on Lovenox  #6 Anxiety- and depression likely- added celexa  #7 Diarrhea- c.diff negative, discontinue doxycycline as diarrhea started after starting doxy, also celexa is a new medication as well - stool cultures ordered - imodium prn  All the records are reviewed and case discussed with Care Management/Social Workerr. Management plans discussed with the patient, family and they are in agreement.  CODE STATUS: Full code  TOTAL TIME TAKING CARE OF THIS PATIENT: 28 minutes.   POSSIBLE D/C TOMORROW, DEPENDING ON CLINICAL CONDITION.   Quinisha Mould M.D on 10/03/2015 at 1:48 PM  Between 7am to 6pm - Pager - [phone]  After 6pm go to [email or web address] - password [email or web address] [facility]  [place] [facility]  Office  [phone]  CC: Primary care physician; Suan Halter, MD

## 2015-10-03 NOTE — Progress Notes (Signed)
   SUBJECTIVE: Pt states she is feeling terrible 2/2 severe diarrhea. No CP, palpitations or SOB.    Filed Vitals:   10/02/15 1643 10/02/15 2010 10/03/15 0411 10/03/15 0814  BP: 126/65 124/65 121/64 127/61  Pulse: 64 66 57 58  Temp: 98.1 F (36.7 C) 98.6 F (37 C) 97.9 F (36.6 C) 97.4 F (36.3 C)  TempSrc: Oral Oral Oral Oral  Resp: Height:      Weight:      SpO2: 99% 98% 99% 97%    Intake/Output Summary (Last 24 hours) at 10/03/15 0852 Last data filed at 10/03/15 0411  Gross per 24 hour  Intake    120 ml  Output    100 ml  Net     20 ml    LABS: Basic Metabolic Panel:  Recent Labs  16/10/96 0239 10/02/15 0531  NA 136 140  K 3.9 4.1  CL 103 103  CO2 29 32  GLUCOSE 147* 155*  BUN 12 20  CREATININE 0.57 0.61  CALCIUM 8.5* 8.8*   Liver Function Tests: No results for input(s): AST, ALT, ALKPHOS, BILITOT, PROT, ALBUMIN in the last 72 hours. No results for input(s): LIPASE, AMYLASE in the last 72 hours. CBC:  Recent Labs  10/01/15 0239 10/02/15 0531  WBC 15.9* 19.7*  HGB 12.3 11.2*  HCT 37.0 33.8*  MCV 88.8 88.7  PLT 333 368   Cardiac Enzymes:  Recent Labs  09/30/15 1650 09/30/15 2137 10/01/15 0239  TROPONINI 0.05* 0.05* 0.06*   BNP: Invalid input(s): POCBNP D-Dimer: No results for input(s): DDIMER in the last 72 hours. Hemoglobin A1C: No results for input(s): HGBA1C in the last 72 hours. Fasting Lipid Panel:  Recent Labs  10/01/15 0239  CHOL 102  HDL 26*  LDLCALC 66  TRIG 49  CHOLHDL 3.9   Thyroid Function Tests: No results for input(s): TSH, T4TOTAL, T3FREE, THYROIDAB in the last 72 hours.  Invalid input(s): FREET3 Anemia Panel: No results for input(s): VITAMINB12, FOLATE, FERRITIN, TIBC, IRON, RETICCTPCT in the last 72 hours.   PHYSICAL EXAM General: Well developed, well nourished, in no acute distress HEENT:  Normocephalic and atramatic Neck:  No JVD.  Lungs: Clear bilaterally to auscultation and  percussion. Heart: HRRR . Normal S1 and S2 without gallops or murmurs.  Abdomen: Bowel sounds are positive, abdomen soft and non-tender  Msk:  Back normal, normal gait. Normal strength and tone for age. Extremities: No clubbing, cyanosis or edema.   Neuro: Alert and oriented X 3. Psych:  Good affect, responds appropriately  TELEMETRY: Reviewed telemetry pt in NSR  ASSESSMENT AND PLAN: SVT and mildly elevated troponin 2/2 demand ischemia. Echo shows EF 65%, grade 1 diastolic dysfunction. If pt remains admitted will consider cardiac cath tomorrow as pt had elevated troponin, CP and SVT.    Patient and plan discussed with supervising provider, Dr. Adrian Blackwater, who agrees with above findings.   Alinda Sierras Margarito Courser [facility]  10/03/2015 8:52 AM

## 2015-10-03 NOTE — Care Management Note (Addendum)
Case Management Note  Patient Details  Name: Ashley [name] MRN: [id number] Date of Birth: October 05, 1956  Subjective/Objective:                  Met with patient and a man names Luciana Axe that was at bedside she referred to as "her second husband". She states her husband is "Ivar Drape". She is self pay. She received a Medicaid card in Feb. And June this year but states they are not active any more. I told her to call Medicaid office. She is new to O2. She has a decub. She is needs to be COPD Gold. She states she was getting Rx assistance through Med Management but "medicaid messed her up". She was going to [facility].   Action/Plan: Applications for Med management and [facility] delivered to this patient with referral to [facility]. Referral called to Will with [place] for potential charity O2. Sats needed.   Expected Discharge Date:                  Expected Discharge Plan:     In-House Referral:     Discharge planning Services  CM Consult  Post Acute Care Choice:    Choice offered to:  Patient  DME Arranged:  Oxygen DME Agency:  [place]:    [place] Agency:     Status of Service:  In process, will continue to follow  Medicare Important Message Given:    Date Medicare IM Given:    Medicare IM give by:    Date Additional Medicare IM Given:    Additional Medicare Important Message give by:     If discussed at [place] of Stay Meetings, dates discussed:    Additional Comments: Oxygen not currently needed. Per PT patient orthostatic and would benefit from HHPT which is not covered under self-pay.  She has a walker available at home for use.  Marshell Garfinkel, RN 10/03/2015, 11:11 AM

## 2015-10-04 LAB — BASIC METABOLIC PANEL
ANION GAP: 6 (ref 5–15)
BUN: 21 mg/dL — AB (ref 6–20)
CHLORIDE: 104 mmol/L (ref 101–111)
CO2: 31 mmol/L (ref 22–32)
Calcium: 8.8 mg/dL — ABNORMAL LOW (ref 8.9–10.3)
Creatinine, Ser: 0.54 mg/dL (ref 0.44–1.00)
GFR calc Af Amer: >60 mL/min (ref >60)
Glucose, Bld: 82 mg/dL (ref 65–99)
POTASSIUM: 3.4 mmol/L — AB (ref 3.5–5.1)
SODIUM: 141 mmol/L (ref 135–145)

## 2015-10-04 MED ORDER — ENOXAPARIN SODIUM 30 MG/0.3ML ~~LOC~~ SOLN
30.0000 mg | SUBCUTANEOUS | Status: DC
  Administered 2015-10-04: 30 mg via SUBCUTANEOUS
  Filled 2015-10-04: qty 0.3

## 2015-10-04 MED ORDER — PREDNISONE 20 MG PO TABS
40.0000 mg | ORAL_TABLET | Freq: Every day | ORAL | Status: DC
  Administered 2015-10-05: 40 mg via ORAL
  Filled 2015-10-04: qty 2

## 2015-10-04 MED ORDER — POTASSIUM CHLORIDE CRYS ER 20 MEQ PO TBCR
40.0000 meq | EXTENDED_RELEASE_TABLET | Freq: Once | ORAL | Status: AC
  Administered 2015-10-04: 40 meq via ORAL
  Filled 2015-10-04: qty 2

## 2015-10-04 NOTE — Progress Notes (Signed)
Nutrition Follow-up  DOCUMENTATION CODES:   Severe malnutrition in context of chronic illness    INTERVENTION:   Meals and Snacks: Cater to patient preferences. Will send snacks between meals. Recommend liberalizing diet order to Regular to optimize intake. RD brought graham crackers and peanut butter to room for pt to eat.  Medical Food Supplement Therapy: continue Ensure Enlive as ordered. Will recommend Magic Cup BID as well for added nutrition and protein. Coordination of Care: recommend daily weights.  May be beneficial to post calorie count to better assess amount of intake.   NUTRITION DIAGNOSIS:   Malnutrition related to chronic illness as evidenced by severe depletion of muscle mass, severe depletion of body fat.  GOAL:   Patient will meet greater than or equal to 90% of their needs  MONITOR:    (Energy Intake, Anthropometrics, Electrolyte./Renal Profile, Digestive System)  REASON FOR ASSESSMENT:   Malnutrition Screening Tool    ASSESSMENT:    Pt admitted with chest pain, SOB; pt with SVT, LUL pneumonia; noted oral thrush per MD notes, started on nystatin.  Per MD notes, pt refused cardiac cath this morning. Pt meeting criteria for COPD Gold. Pt now with diarrhea per chart review, c.diff negative.  Diet Order:  Diet regular Room service appropriate?: Yes; Fluid consistency:: Thin    Current Nutrition: Pt reports trying to eat as much as she can but feeling very frustrated with diarrhea that her intake does not 'stick.' Pt reports po intake now becoming low priority as she is worried about GI symptoms and weakness. RD and pt talked about fuel and needing nutrition to help with weakness and GI losses on visit.     Gastrointestinal Profile: Last BM:  10/04/2015 multiple loose stools, c.diff negative   Medications: prednisone, KCl, imodium  Electrolyte/Renal Profile and Glucose Profile:   Recent Labs Lab 10/01/15 0239 10/02/15 0531 10/04/15 0651  NA 136  140 141  K 3.9 4.1 3.4*  CL 103 103 104  CO2 29 32 31  BUN 12 20 21*  CREATININE 0.57 0.61 0.54  CALCIUM 8.5* 8.8* 8.8*  GLUCOSE 147* 155* 82   Protein Profile: No results for input(s): ALBUMIN in the last 168 hours.   Weight Trend since Admission: Filed Weights   09/30/15 1341 09/30/15 2030  Weight: 70 lb (31.752 kg) 75 lb 4.8 oz (34.156 kg)     Skin:   (stage I sacrum)   BMI:  Body mass index is 13.77 kg/(m^2).  Estimated Nutritional Needs:   Kcal:  1524-1801 kcals (BEE 1066, 1.1-1.3 IF, 1.3 AF)  Protein:  50-60 g (1.0-1.2 g/kg)   Fluid:  1250-1500 mL (25-30 ml/kG)     HIGH Care Level  Leda Quail, RD, LDN Pager [phone]

## 2015-10-04 NOTE — Evaluation (Signed)
Occupational Therapy Evaluation Patient Details Name: Ashley [name] MRN: [id number] DOB: Nov 02, 1956 Today's Date: 10/04/2015    History of Present Illness This patient is a 59 year old female who came to [facility] with shortness of breath.   Clinical Impression   This patient is a 59 year old female who came to [facility] with  COPD but had shortness of breath and is recovering from pneumonia.  She lives in a one story home with her husband and one  step to enter. She had been independent with ADL and functional mobility but recently has become more fatigued. She does get shortness of breath when performing certain ADL.   She would benefit from Occupational Therapy for activities of daily living training for energy saving techniques.    Follow Up Recommendations       Equipment Recommendations       Recommendations for Other Services       Precautions / Restrictions        Mobility Bed Mobility                  Transfers                      Balance                                            ADL                                         General ADL Comments: She had been independent with her ADL but over the last few weeks has been getting more difficult and is fatigued very easily. Had patient sit on edge of bed and began to practice with hip kit for lower body dressing when she just laid back down and stated that is all she could do. Continued to further instruct patient in use of hip kit to prevent shortness of breath during lower body dressing. Also included energy saving techniques.       Vision     Perception     Praxis      Pertinent Vitals/Pain       Hand Dominance     Extremity/Trunk Assessment Upper Extremity Assessment Upper Extremity Assessment:  (Over all upper extremity strength is 4-/5 (triceps 3+/5) but endurance is very low.)   Lower Extremity Assessment Lower Extremity  Assessment: Defer to PT evaluation       Communication     Cognition Arousal/Alertness: Awake/alert Behavior During Therapy: Anxious                       General Comments       Exercises       Shoulder Instructions      Home Living Family/patient expects to be discharged to:: Private residence Living Arrangements: Spouse/significant other   Type of Home: House Home Access: Stairs to enter Technical brewer of Steps: 1   Home Layout: One level                          Prior Functioning/Environment               OT Diagnosis: Generalized weakness   OT  Problem List:     OT Treatment/Interventions: Self-care/ADL training    OT Goals(Current goals can be found in the care plan section) Acute Rehab OT Goals Patient Stated Goal: Just to feel better OT Goal Formulation: With patient Time For Goal Achievement: 10/18/15 Potential to Achieve Goals: Good  OT Frequency: Min 1X/week   Barriers to D/C:            Co-evaluation              End of Session Equipment Utilized During Treatment:  (hip kit)  Activity Tolerance: Patient limited by fatigue Patient left: in bed;with call bell/phone within reach;with bed alarm set   Time: 4600-2984 OT Time Calculation (min): 13 min Charges:  OT General Charges $OT Visit: 1 Procedure OT Evaluation $Initial OT Evaluation Tier I: 1 Procedure G-Codes:    Myrene Galas, MS/OTR/L  10/04/2015, 11:18 AM

## 2015-10-04 NOTE — Progress Notes (Signed)
   SUBJECTIVE: Pt states she woke up at 4am and felt SOB with pressure in chest. Relieved presently.    Filed Vitals:   10/03/15 2047 10/04/15 0352 10/04/15 0543 10/04/15 0812  BP: 139/68 170/83 120/58 159/76  Pulse: 72 71  73  Temp: 98.4 F (36.9 C) 98.2 F (36.8 C)  98.5 F (36.9 C)  TempSrc: Oral Oral    Resp: Height:      Weight:      SpO2: 93% 93%  90%    Intake/Output Summary (Last 24 hours) at 10/04/15 0854 Last data filed at 10/04/15 0815  Gross per 24 hour  Intake 693.33 ml  Output      1 ml  Net 692.33 ml    LABS: Basic Metabolic Panel:  Recent Labs  95/28/41 0531 10/04/15 0651  NA 140 141  K 4.1 3.4*  CL 103 104  CO2 32 31  GLUCOSE 155* 82  BUN 20 21*  CREATININE 0.61 0.54  CALCIUM 8.8* 8.8*   Liver Function Tests: No results for input(s): AST, ALT, ALKPHOS, BILITOT, PROT, ALBUMIN in the last 72 hours. No results for input(s): LIPASE, AMYLASE in the last 72 hours. CBC:  Recent Labs  10/02/15 0531  WBC 19.7*  HGB 11.2*  HCT 33.8*  MCV 88.7  PLT 368   Cardiac Enzymes: No results for input(s): CKTOTAL, CKMB, CKMBINDEX, TROPONINI in the last 72 hours. BNP: Invalid input(s): POCBNP D-Dimer: No results for input(s): DDIMER in the last 72 hours. Hemoglobin A1C: No results for input(s): HGBA1C in the last 72 hours. Fasting Lipid Panel: No results for input(s): CHOL, HDL, LDLCALC, TRIG, CHOLHDL, LDLDIRECT in the last 72 hours. Thyroid Function Tests: No results for input(s): TSH, T4TOTAL, T3FREE, THYROIDAB in the last 72 hours.  Invalid input(s): FREET3 Anemia Panel: No results for input(s): VITAMINB12, FOLATE, FERRITIN, TIBC, IRON, RETICCTPCT in the last 72 hours.   PHYSICAL EXAM General: Well developed, well nourished, in no acute distress HEENT:  Normocephalic and atramatic Neck:  No JVD.  Lungs: Clear bilaterally to auscultation and percussion. Heart: HRRR . Normal S1 and S2 without gallops or murmurs.  Abdomen: Bowel  sounds are positive, abdomen soft and non-tender  Msk:  Back normal, normal gait. Normal strength and tone for age. Extremities: No clubbing, cyanosis or edema.   Neuro: Alert and oriented X 3. Psych:  Good affect, responds appropriately  TELEMETRY: Reviewed telemetry pt in NSR  ASSESSMENT AND PLAN: SVT and mildly elevated troponin 2/2 demand ischemia. Echo shows EF 65%, grade 1 diastolic dysfunction. Planned for cardiac cath today especially with pt having CP and SOB last night, but pt refuses. Pt informed she is at risk for MI, but continues to refuse cath stating "she is too sick for a procedure and to lie flat". Continue asa and BP control.   Patient and plan discussed with supervising provider, Dr. Adrian Blackwater, who agrees with above findings.   Alinda Sierras Margarito Courser [facility]  10/04/2015 8:54 AM

## 2015-10-04 NOTE — Progress Notes (Signed)
Copd PACKET GIVEN TO PT WITH EXPLAINATION GIVEN PT SIGNED UP FOR emmi, CARE PATH OPENED

## 2015-10-04 NOTE — Clinical Social Work Note (Signed)
Clinical Social Work Assessment  Patient Details  Name: Ashley [name] MRN: [id number] Date of Birth: 1956-02-14  Date of referral:  10/04/15               Reason for consult:  Other (Comment Required) (COPD Gold )                Permission sought to share information with:    Permission granted to share information::  No  Name::        Agency::     Relationship::     Contact Information:     Housing/Transportation Living arrangements for the past 2 months:  Single Family Home Source of Information:  Patient Patient Interpreter Needed:  None Criminal Activity/Legal Involvement Pertinent to Current Situation/Hospitalization:  No - Comment as needed Significant Relationships:  Spouse Lives with:  Spouse Do you feel safe going back to the place where you live?  Yes Need for family participation in patient care:  Yes (Comment)  Care giving concerns:  Patient lives with her husband Ashley [name] in [place].    Social Worker assessment / plan:  Holiday representative (CSW) received a COPD [employer]. CSW met with patient and introduced self and explained role of CSW department. Patient was alert and oriented and sitting in the bed. PT is recommending home health. 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 Patient reported that she lives with her husband in [place]. Patient reported that she does get anxious mostly at night when it is bed time. Patient also reported that she has panic attacks. Patient denied depression symptoms. Per patient she took Valium from the ED doctor and it helped. Patient reported that her anxiety is mostly related to COPD and feeling like she can't breath. Patient also reported that reading and doing cross word puzzles helps with her anxiety. CSW provided community resources including [facility].  Employment status:  Retired Forensic scientist:  Self Pay (Medicaid Pending) PT Recommendations:  Home with [place] / Referral  to community resources:  Other (Comment Required) (Community Resouces )  Patient/Family's Response to care:  Patient is agreeable to returning home.   Patient/Family's Understanding of and Emotional Response to Diagnosis, Current Treatment, and Prognosis:  Patient thanked CSW for visit and providing resources.   Emotional Assessment Appearance:  Appears stated age Attitude/Demeanor/Rapport:    Affect (typically observed):  Accepting, Adaptable, Pleasant Orientation:  Oriented to Self, Oriented to Place, Oriented to  Time Alcohol / Substance use:  Not Applicable Psych involvement (Current and /or in the community):  No (Comment)  Discharge Needs  Concerns to be addressed:  No discharge needs identified Readmission within the last 30 days:  No Current discharge risk:  None Barriers to Discharge:  No Barriers Identified   Ashley Freshwater, LCSW 10/04/2015, 4:50 PM

## 2015-10-04 NOTE — Progress Notes (Signed)
Pt. Alert and oriented. VSS. No c/o pain. Pills whole with water. Rested quietly throughout the night. Receiving PO antibiotics. Will continue to monitor.

## 2015-10-04 NOTE — Progress Notes (Signed)
[facility] - [place] at [facility]   PATIENT NAME: Ashley [name]    MR#:  [id number]  DATE OF BIRTH:  05-May-1956  SUBJECTIVE:  CHIEF COMPLAINT:   Chief Complaint  Patient presents with  . Shortness of Breath  . Chest Pain   -Diarrhea is some better today. Still having loose stools. -Feels extremely weak requiring bedpan and couldn't even get out from the bed -Labs with low potassium -Breathing is much improved, off oxygen today  REVIEW OF SYSTEMS:  Review of Systems  Constitutional: Negative for fever and chills.  HENT: Negative for ear discharge, ear pain and nosebleeds.   Eyes: Negative for blurred vision and double vision.  Respiratory: Negative for cough, shortness of breath and wheezing.   Cardiovascular: Negative for chest pain, palpitations, orthopnea and PND.  Gastrointestinal: Positive for diarrhea. Negative for nausea, vomiting, abdominal pain and constipation.  Genitourinary: Negative for dysuria.  Neurological: Negative for dizziness, sensory change, speech change, focal weakness, seizures, weakness and headaches.  Psychiatric/Behavioral: Negative for depression. The patient is nervous/anxious.     DRUG ALLERGIES:  No Known Allergies  VITALS:  Blood pressure 159/76, pulse 73, temperature 98.5 F (36.9 C), temperature source Oral, resp. rate 17, height  (1.575 m), weight 34.156 kg (75 lb 4.8 oz), SpO2 90 %.  PHYSICAL EXAMINATION:  Physical Exam  GENERAL:  59 y.o.-year-old patient lying in the bed with no acute distress.  EYES: Pupils equal, round, reactive to light and accommodation. No scleral icterus. Extraocular muscles intact.  HEENT: Head atraumatic, normocephalic. Oropharynx and nasopharynx clear. Oral thrush noted on the tongue. NECK:  Supple, no jugular venous distention. No thyroid enlargement, no tenderness.  LUNGS: Normal breath sounds bilaterally, no rales,rhonchi or crepitation. No use of accessory muscles of  respiration. No expiratory wheezing noted on exam CARDIOVASCULAR: S1, S2 normal. No murmurs, rubs, or gallops.  ABDOMEN: Soft, nontender, nondistended. Bowel sounds present. No organomegaly or mass.  EXTREMITIES: No pedal edema, cyanosis, or clubbing.  NEUROLOGIC: Cranial nerves II through XII are intact. Muscle strength 5/5 in all extremities. Sensation intact. Gait not checked.  PSYCHIATRIC: The patient is alert and oriented x 3.  SKIN: No obvious rash, lesion, or ulcer.    LABORATORY PANEL:   CBC  Recent Labs Lab 10/02/15 0531  WBC 19.7*  HGB 11.2*  HCT 33.8*  PLT 368   ------------------------------------------------------------------------------------------------------------------  Chemistries   Recent Labs Lab 10/04/15 0651  NA 141  K 3.4*  CL 104  CO2 31  GLUCOSE 82  BUN 21*  CREATININE 0.54  CALCIUM 8.8*   ------------------------------------------------------------------------------------------------------------------  Cardiac Enzymes  Recent Labs Lab 10/01/15 0239  TROPONINI 0.06*   ------------------------------------------------------------------------------------------------------------------  RADIOLOGY:  Dg Chest 2 View  10/03/2015   CLINICAL DATA:  History of pneumonia, COPD, smoking history  EXAM: CHEST  2 VIEW  COMPARISON:  Portable chest x-ray of 09/30/2015 and 09/25/2015  FINDINGS: There has been significant improvement in the left upper lobe pneumonia. Somewhat prominent markings remain in the left mid lower lung field most consistent with residual pneumonia. The right lung is clear. Both lungs are markedly hyperaerated consistent with severe emphysema. No effusion is seen. Heart size is stable. No bony abnormality is noted.  IMPRESSION: Significant improvement in the left upper lobe pneumonia with some opacity remaining consistent with residual pneumonia. Severe emphysema.   Electronically Signed   By: Dwyane Dee M.D.   On: 10/03/2015 08:03     EKG:   Orders placed or performed during  the hospital encounter of 09/30/15  . EKG 12-Lead  . EKG 12-Lead  . EKG 12-Lead  . EKG 12-Lead  . EKG 12-Lead  . EKG 12-Lead    ASSESSMENT AND PLAN:   59 year old female with past medical history significant for COPD, recent admission for bronchitis last week comes back to the emergency room with chest pain and also noted to be tachycardic  #1 Left upper lobe pneumonia-its new compared to recent chest x-ray and also CT chest done last week. -Negative blood cultures.  -Improving oxygen requirements. - Chest x-ray with improvement. Changed antibiotics to Levaquin orally- treat for a total of 7 days  #2 supraventricular tachycardia-on amiodarone oral dose. -Echocardiogram done with normal EF. -Cardiology consultation is appreciated -Borderline elevated troponin due to demand ischemia.  Follow up with outpatient stress test  #3 COPD-mild exacerbation. -Continue duo nebs and also on prednisone- taper prednisonetaper  #4 oral thrush-started nystatin, treat for a total of 7 days.  #5 DVT prophylaxis-on Lovenox  #6 Anxiety- and depression likely- added celexa  #7 Diarrhea- c.diff negative, discontinue doxycycline as diarrhea started after starting doxy, also celexa is a new medication as well - stool cultures ordered - imodium prn, probiotics added as well.  #8 hypokalemia-being replaced  Physical therapy consult today  All the records are reviewed and case discussed with Care Management/Social Workerr. Management plans discussed with the patient, family and they are in agreement.  CODE STATUS: Full code  TOTAL TIME TAKING CARE OF THIS PATIENT: 35 minutes.   POSSIBLE D/C TOMORROW, DEPENDING ON CLINICAL CONDITION, if diarrhea improves.   Enid Baas M.D on 10/04/2015 at 10:37 AM  Between 7am to 6pm - Pager - [phone]  After 6pm go to [email or web address] - password [email or web address] [facility]  [facility]  Office   [phone]  CC: Primary care physician; Suan Halter, MD

## 2015-10-04 NOTE — Evaluation (Signed)
Physical Therapy Evaluation Patient Details Name: Ashley [name] MRN: [id number] DOB: 09/26/1956 Today's Date: 10/04/2015   History of Present Illness  This patient is a 59 year old female who came to [facility] with shortness of breath.  Clinical Impression  Patient displays independence with bed mobility and transfers in this session, she was however limited with ambulation secondary to orthostatic dizziness. She did not display any loss of balance with ambulation, however she was clearly decreased in her independence and safety with mobility (patient noted to be shaking through ambulation generally). Patient appears quite anxious through this evaluation, and has decreased in strength from GI complications, she states due to her medications as well. Patient appears to have balance and ambulation tolerance deficits and would benefit from additional strengthening to increase her safety with mobility. Skilled PT services are indicated to address the above deficits.     Follow Up Recommendations Home health PT    Equipment Recommendations  Rolling walker with 5" wheels    Recommendations for Other Services       Precautions / Restrictions Precautions Precautions: Fall Precaution Comments: Orthostatics  Restrictions Weight Bearing Restrictions: No      Mobility  Bed Mobility Overal bed mobility: Independent                Transfers Overall transfer level: Independent               General transfer comment: No assistance required for bedside commode transfer.   Ambulation/Gait Ambulation/Gait assistance: Min guard Ambulation Distance (Feet): 15 Feet Assistive device: Rolling walker (2 wheeled) Gait Pattern/deviations: WFL(Within Functional Limits);Decreased step length - right;Decreased step length - left;Narrow base of support   Gait velocity interpretation: <1.8 ft/sec, indicative of risk for recurrent falls General Gait Details: Patient ambulates with short steps and  gernalized shakiness, it appears due to weakness. On the way out the room, patient began complaining for dizziness and requested to sit down immediately, she was found to be orthostatic.   Stairs            Wheelchair Mobility    Modified Rankin (Stroke Patients Only)       Balance Overall balance assessment: Needs assistance Sitting-balance support: Feet supported Sitting balance-Leahy Scale: Good Sitting balance - Comments: Independent with sitting balance.    Standing balance support: Bilateral upper extremity supported Standing balance-Leahy Scale: Fair Standing balance comment: Unable to formally assess balance, she did not display any loss of balance but was noted to be orthostatic.                             Pertinent Vitals/Pain Pain Assessment: No/denies pain    Home Living Family/patient expects to be discharged to:: Private residence Living Arrangements: Spouse/significant other (Per patient her husband works all day) Available Help at Discharge: Family Type of Home: House Home Access: Stairs to enter Entrance Stairs-Rails: None Entrance Stairs-Number of Steps: 1 Home Layout: One level Home Equipment: Environmental consultant - 2 wheels      Prior Function Level of Independence: Independent;Independent with assistive device(s)         Comments: Patient uses a RW PRN as needed. Otherwise patient has been independent with ambulation per her comments.      Hand Dominance        Extremity/Trunk Assessment   Upper Extremity Assessment: Overall WFL for tasks assessed;Generalized weakness           Lower Extremity Assessment: Overall  WFL for tasks assessed;Generalized weakness (Poor effort provided, [facility] for knee extension, hip flexors weak but able to complete anti-gravity)         Communication   Communication: No difficulties  Cognition Arousal/Alertness: Awake/alert Behavior During Therapy: Anxious Overall Cognitive Status:  (Patient appears  to be very anxious and unable to provide consistent responses.)                      General Comments      Exercises        Assessment/Plan    PT Assessment Patient needs continued PT services  PT Diagnosis Difficulty walking;Generalized weakness   PT Problem List Decreased strength;Decreased mobility;Decreased activity tolerance;Decreased balance;Decreased knowledge of use of DME  PT Treatment Interventions Gait training;DME instruction;Therapeutic activities;Therapeutic exercise;Balance training   PT Goals (Current goals can be found in the Care Plan section) Acute Rehab PT Goals Patient Stated Goal: Just to feel better PT Goal Formulation: With patient Time For Goal Achievement: 10/18/15 Potential to Achieve Goals: Good    Frequency Min 2X/week   Barriers to discharge Decreased caregiver support Patient presents as a falls risk and would benefit from having spouse present for mobility.     Co-evaluation               End of Session Equipment Utilized During Treatment: Gait belt Activity Tolerance: Patient tolerated treatment well;Patient limited by fatigue Patient left: in bed;with call bell/phone within reach;with bed alarm set Nurse Communication: Mobility status         Time: 1020-1037 PT Time Calculation (min) (ACUTE ONLY): 17 min   Charges:   PT Evaluation $Initial PT Evaluation Tier I: 1 Procedure     PT G Codes:       Kerin Ransom, PT, DPT    10/04/2015, 2:48 PM

## 2015-10-05 DIAGNOSIS — R197 Diarrhea, unspecified: Secondary | ICD-10-CM

## 2015-10-05 LAB — CULTURE, BLOOD (ROUTINE X 2)
CULTURE: NO GROWTH
Culture: NO GROWTH

## 2015-10-05 LAB — BASIC METABOLIC PANEL
ANION GAP: 5 (ref 5–15)
BUN: 16 mg/dL (ref 6–20)
CALCIUM: 8.9 mg/dL (ref 8.9–10.3)
CO2: 31 mmol/L (ref 22–32)
Chloride: 106 mmol/L (ref 101–111)
Creatinine, Ser: 0.54 mg/dL (ref 0.44–1.00)
GFR calc Af Amer: >60 mL/min (ref >60)
GFR calc non Af Amer: >60 mL/min (ref >60)
GLUCOSE: 81 mg/dL (ref 65–99)
Potassium: 4.2 mmol/L (ref 3.5–5.1)
Sodium: 142 mmol/L (ref 135–145)

## 2015-10-05 LAB — WBCS, STOOL
WBCS, STOOL: NONE SEEN
WBCS, STOOL: NONE SEEN

## 2015-10-05 MED ORDER — PREDNISONE 10 MG PO TABS: 10.0000 mg | ORAL_TABLET | Freq: Every day | ORAL | Status: DC

## 2015-10-05 MED ORDER — RISAQUAD PO CAPS: 1.0000 | ORAL_CAPSULE | Freq: Two times a day (BID) | ORAL | Status: DC

## 2015-10-05 MED ORDER — CITALOPRAM HYDROBROMIDE 20 MG PO TABS: 20.0000 mg | ORAL_TABLET | Freq: Every day | ORAL | Status: DC

## 2015-10-05 MED ORDER — AMIODARONE HCL 200 MG PO TABS: 200.0000 mg | ORAL_TABLET | Freq: Every day | ORAL | Status: DC

## 2015-10-05 MED ORDER — LEVOFLOXACIN 250 MG PO TABS: 250.0000 mg | ORAL_TABLET | Freq: Every day | ORAL | Status: DC

## 2015-10-05 NOTE — Progress Notes (Signed)
   SUBJECTIVE: Pt states she is feeling much better this morning. No further CP.   Filed Vitals:   10/04/15 1939 10/05/15 0446 10/05/15 0747 10/05/15 1008  BP: 132/68 164/85 158/75   Pulse: 73 73 66 82  Temp: 98.2 F (36.8 C) 98.1 F (36.7 C) 98.2 F (36.8 C)   TempSrc: Oral Oral Oral   Resp: 17 18    Height:  5\' 2"  (1.575 m)    Weight:  31.253 kg (68 lb 14.4 oz)    SpO2: 94% 89% 90% 91%    Intake/Output Summary (Last 24 hours) at 10/05/15 1038 Last data filed at 10/04/15 1938  Gross per 24 hour  Intake    245 ml  Output      3 ml  Net    242 ml    LABS: Basic Metabolic Panel:  Recent Labs  09/81/1909/11/08 0651 10/05/15 0617  NA 141 142  K 3.4* 4.2  CL 104 106  CO2 31 31  GLUCOSE 82 81  BUN 21* 16  CREATININE 0.54 0.54  CALCIUM 8.8* 8.9   Liver Function Tests: No results for input(s): AST, ALT, ALKPHOS, BILITOT, PROT, ALBUMIN in the last 72 hours. No results for input(s): LIPASE, AMYLASE in the last 72 hours. CBC: No results for input(s): WBC, NEUTROABS, HGB, HCT, MCV, PLT in the last 72 hours. Cardiac Enzymes: No results for input(s): CKTOTAL, CKMB, CKMBINDEX, TROPONINI in the last 72 hours. BNP: Invalid input(s): POCBNP D-Dimer: No results for input(s): DDIMER in the last 72 hours. Hemoglobin A1C: No results for input(s): HGBA1C in the last 72 hours. Fasting Lipid Panel: No results for input(s): CHOL, HDL, LDLCALC, TRIG, CHOLHDL, LDLDIRECT in the last 72 hours. Thyroid Function Tests: No results for input(s): TSH, T4TOTAL, T3FREE, THYROIDAB in the last 72 hours.  Invalid input(s): FREET3 Anemia Panel: No results for input(s): VITAMINB12, FOLATE, FERRITIN, TIBC, IRON, RETICCTPCT in the last 72 hours.   PHYSICAL EXAM General: Well developed, well nourished, in no acute distress HEENT:  Normocephalic and atramatic Neck:  No JVD.  Lungs: Clear bilaterally to auscultation and percussion. Heart: HRRR . Normal S1 and S2 without gallops or murmurs.  Abdomen:  Bowel sounds are positive, abdomen soft and non-tender  Msk:  Back normal, normal gait. Normal strength and tone for age. Extremities: No clubbing, cyanosis or edema.   Neuro: Alert and oriented X 3. Psych:  Good affect, responds appropriately  TELEMETRY: Reviewed telemetry pt in NSR  ASSESSMENT AND PLAN: Pt continues to refuse cardiac cath. Advise continuation of amiodarone.    Patient and plan discussed with supervising provider, Dr. Adrian BlackwaterShaukat [name], who agrees with above findings.   Ashley [name] A. Margarito CourserRomano, PA-C [facility]  10/05/2015 10:38 AM

## 2015-10-05 NOTE — Progress Notes (Signed)
Pt. Alert and oriented. VSS. No c/o pain. Up ambulating to bathroom. Voiding clear yellow urine. No stools throughout the night. Rested soundly. Will continue to monitor.

## 2015-10-05 NOTE — Progress Notes (Signed)
Patient alert and oriented. VSS.  No complaints of pain. On RA.  Removed PIV.  Given Rx for new medications and follow up appointments.  Patient has no questions.  Family member has been called.  Patient to be escorted out of hospital via wheelchair.

## 2015-10-05 NOTE — Progress Notes (Signed)
Physical Therapy Treatment Patient Details Name: Ashley [name] MRN: [id number] DOB: March 13, 1956 Today's Date: 10/05/2015    History of Present Illness This patient is a 59 year old female who came to [facility] with shortness of breath.    PT Comments    Patient continues to present initially with orthostatic hypotension symptoms and begins to get rather anxious throughout this session. Per the patient she has been ambulating to and from the bathroom throughout the night with the RW and these symptoms are both chronic and induced by "all of the medicines they just gave me". Patient is otherwise independent (or modified I with RW) with mobility and no loss of balance. Patient would continue to benefit from skilled PT services to address her mobility deficits.   Follow Up Recommendations  Home health PT     Equipment Recommendations  Rolling walker with 5" wheels    Recommendations for Other Services       Precautions / Restrictions Precautions Precautions: Fall Precaution Comments: Orthostatics  Restrictions Weight Bearing Restrictions: No    Mobility  Bed Mobility Overal bed mobility: Independent                Transfers Overall transfer level: Independent               General transfer comment: No assistance needed and no loss of balance with transfer.   Ambulation/Gait Ambulation/Gait assistance: Supervision Ambulation Distance (Feet): 15 Feet Assistive device: Rolling walker (2 wheeled) Gait Pattern/deviations: WFL(Within Functional Limits);Decreased step length - right;Decreased step length - left;Narrow base of support   Gait velocity interpretation: Below normal speed for age/gender General Gait Details: Patient demonstrates no loss of balance with ambulation. She continues to state she can't "do what you're asking me to do" because of the medications provided prior to ambulation.    Stairs            Wheelchair Mobility    Modified Rankin (Stroke  Patients Only)       Balance Overall balance assessment: Needs assistance Sitting-balance support: Feet supported Sitting balance-Leahy Scale: Normal Sitting balance - Comments: No loss of balance noted.    Standing balance support: Bilateral upper extremity supported Standing balance-Leahy Scale: Good Standing balance comment: unable to formally assess as patient is rather anxious, no loss of balance noted.                     Cognition Arousal/Alertness: Awake/alert Behavior During Therapy: Anxious Overall Cognitive Status:  (Patient is again quite anxious and provides brief delayed responses.)                      Exercises General Exercises - Lower Extremity Long Arc Quad: AROM;Both;10 reps Other Exercises Other Exercises: Sit to stands x 5 independently to assist with orthostatics.     General Comments        Pertinent Vitals/Pain Pain Assessment: No/denies pain    Home Living                      Prior Function            PT Goals (current goals can now be found in the care plan section) Acute Rehab PT Goals Patient Stated Goal: Just to feel better PT Goal Formulation: With patient Time For Goal Achievement: 10/18/15 Potential to Achieve Goals: Good Progress towards PT goals: Progressing toward goals    Frequency  Min 2X/week    PT  Plan Current plan remains appropriate    Co-evaluation             End of Session Equipment Utilized During Treatment: Gait belt Activity Tolerance: Patient tolerated treatment well;Patient limited by fatigue Patient left: in bed;with call bell/phone within reach;with bed alarm set     Time: [id number] PT Time Calculation (min) (ACUTE ONLY): 13 min  Charges:  $Gait Training: 8-22 mins                    G Codes:      Kerin RansomPatrick A McNamara, PT, DPT    10/05/2015, 10:15 AM

## 2015-10-05 NOTE — Discharge Summary (Signed)
[facility] [place] [facility]   PATIENT NAME: Ashley RainSusan [name]    MR#:  [id number]  DATE OF BIRTH:  [birth year]  DATE OF ADMISSION:  09/30/2015 ADMITTING PHYSICIAN: Altamese DillingVaibhavkumar Vachhani, MD  DATE OF DISCHARGE: 10/05/2015 12:50 PM  PRIMARY CARE PHYSICIAN: Suan HalterAMPBELL,MARGARET F, MD    ADMISSION DIAGNOSIS:  Healthcare-associated pneumonia [J18.9] Sepsis, due to unspecified organism (HCC) [A41.9] Left sided chest pain [R07.9] Supraventricular tachycardia by ECG (HCC) [I47.1]  DISCHARGE DIAGNOSIS:  Principal Problem:   SVT (supraventricular tachycardia) (HCC) Active Problems:   Pneumonia   Pressure ulcer   Protein-calorie malnutrition, severe   Malnutrition (HCC)   Diarrhea   SECONDARY DIAGNOSIS:   Past Medical History  Diagnosis Date  . COPD (chronic obstructive pulmonary disease) (HCC)   . Hypertension   . Anxiety     HOSPITAL COURSE:  59 year old female with past medical history significant for COPD, recent admission for bronchitis last week comes back to the emergency room with chest pain and also noted to be tachycardic  #1 Left upper lobe pneumonia-its new compared to recent chest x-ray and also CT chest done last week. -Negative blood cultures.  -off oxygen at discharge - Repeat Chest x-ray with improvement. On oral levaquin  #2 supraventricular tachycardia-on amiodarone oral dose. -Echocardiogram done with normal EF. -Cardiology consultation is appreciated -Borderline elevated troponin due to demand ischemia. Follow up with outpatient stress test  #3 COPD-mild exacerbation. -much improved, cont prednisone taper, on ABX, inahlers  #4 oral thrush-finished nystatin as improved  #5 Anxiety- and depression - added celexa Outpatient f/u recommended  #7 Diarrhea- likely from ABX - c.diff negative, now improved after changing ABX, also started probiotics - negative stool cultures  - imodium prn  Discharge today, Physical therapy  recommended home health, but self-pay. Says she is close to baseline and lives with husband who can help her.  DISCHARGE CONDITIONS:   Stable  CONSULTS OBTAINED:  Treatment Team:  Mertie MooresHerbon E Fleming, MD Laurier NancyShaukat A Khan, MD Yevonne PaxSaadat A Khan, MD  DRUG ALLERGIES:  No Known Allergies  DISCHARGE MEDICATIONS:   Discharge Medication List as of 10/05/2015 10:39 AM    START taking these medications   Details  acidophilus (RISAQUAD) CAPS capsule Take 1 capsule by mouth 2 (two) times daily., Starting 10/05/2015, Until Discontinued, Print    amiodarone (PACERONE) 200 MG tablet Take 1 tablet (200 mg total) by mouth daily., Starting 10/05/2015, Until Discontinued, Print    citalopram (CELEXA) 20 MG tablet Take 1 tablet (20 mg total) by mouth daily., Starting 10/05/2015, Until Discontinued, Print    levofloxacin (LEVAQUIN) 250 MG tablet Take 1 tablet (250 mg total) by mouth daily., Starting 10/05/2015, Until Discontinued, Print      CONTINUE these medications which have CHANGED   Details  predniSONE (DELTASONE) 10 MG tablet Take 1 tablet (10 mg total) by mouth daily with breakfast. 4 tabs PO x 1 day 3 tabs PO x 1 day 2 tabs PO x 1 day 1 tab PO x 1 day and stop, Starting 10/05/2015, Until Discontinued, Print      CONTINUE these medications which have NOT CHANGED   Details  albuterol (PROAIR HFA) 108 (90 BASE) MCG/ACT inhaler Inhale 2 puffs into the lungs every 6 (six) hours as needed for wheezing or shortness of breath. , Until Discontinued, Historical Med    aspirin EC 81 MG tablet Take 81 mg by mouth daily. , Until Discontinued, Historical Med    Fluticasone-Salmeterol (ADVAIR DISKUS) 500-50 MCG/DOSE AEPB Inhale  1 puff into the lungs 2 (two) times daily. , Until Discontinued, Historical Med    ipratropium-albuterol (DUONEB) 0.5-2.5 (3) MG/3ML SOLN Inhale 3 mLs into the lungs 4 (four) times daily as needed (for shortness of breath and/or wheezing). , Until Discontinued, Historical Med     tiotropium (SPIRIVA) 18 MCG inhalation capsule Place 1 capsule into inhaler and inhale daily., Until Discontinued, Historical Med      STOP taking these medications     azithromycin (ZITHROMAX) 250 MG tablet      oxyCODONE (OXY IR/ROXICODONE) 5 MG immediate release tablet          DISCHARGE INSTRUCTIONS:   1. PCP f/u in 1-2 weeks  If you experience worsening of your admission symptoms, develop shortness of breath, life threatening emergency, suicidal or homicidal thoughts you must seek medical attention immediately by calling 911 or calling your MD immediately  if symptoms less severe.  You Must read complete instructions/literature along with all the possible adverse reactions/side effects for all the Medicines you take and that have been prescribed to you. Take any new Medicines after you have completely understood and accept all the possible adverse reactions/side effects.   Please note  You were cared for by a hospitalist during your hospital stay. If you have any questions about your discharge medications or the care you received while you were in the hospital after you are discharged, you can call the unit and asked to speak with the hospitalist on call if the hospitalist that took care of you is not available. Once you are discharged, your primary care physician will handle any further medical issues. Please note that NO REFILLS for any discharge medications will be authorized once you are discharged, as it is imperative that you return to your primary care physician (or establish a relationship with a primary care physician if you do not have one) for your aftercare needs so that they can reassess your need for medications and monitor your lab values.    Today   CHIEF COMPLAINT:   Chief Complaint  Patient presents with  . Shortness of Breath  . Chest Pain    VITAL SIGNS:  Blood pressure 158/75, pulse 82, temperature 98.2 F (36.8 C), temperature source Oral, resp.  rate 18, height  (1.575 m), weight 31.253 kg (68 lb 14.4 oz), SpO2 91 %.  I/O:   Intake/Output Summary (Last 24 hours) at 10/05/15 1510 Last data filed at 10/04/15 1938  Gross per 24 hour  Intake    125 ml  Output      1 ml  Net    124 ml    PHYSICAL EXAMINATION:   Physical Exam  GENERAL: 59 y.o.-year-old patient lying in the bed with no acute distress.  EYES: Pupils equal, round, reactive to light and accommodation. No scleral icterus. Extraocular muscles intact.  HEENT: Head atraumatic, normocephalic. Oropharynx and nasopharynx clear. Oral thrush noted on the tongue. NECK: Supple, no jugular venous distention. No thyroid enlargement, no tenderness.  LUNGS: Normal breath sounds bilaterally, no rales,rhonchi or crepitation. No use of accessory muscles of respiration. No expiratory wheezing noted on exam CARDIOVASCULAR: S1, S2 normal. No murmurs, rubs, or gallops.  ABDOMEN: Soft, nontender, nondistended. Bowel sounds present. No organomegaly or mass.  EXTREMITIES: No pedal edema, cyanosis, or clubbing.  NEUROLOGIC: Cranial nerves II through XII are intact. Muscle strength 5/5 in all extremities. Sensation intact. Gait not checked.  PSYCHIATRIC: The patient is alert and oriented x 3.  SKIN: No obvious  rash, lesion, or ulcer.   DATA REVIEW:   CBC  Recent Labs Lab 10/02/15 0531  WBC 19.7*  HGB 11.2*  HCT 33.8*  PLT 368    Chemistries   Recent Labs Lab 10/05/15 0617  NA 142  K 4.2  CL 106  CO2 31  GLUCOSE 81  BUN 16  CREATININE 0.54  CALCIUM 8.9    Cardiac Enzymes  Recent Labs Lab 10/01/15 0239  TROPONINI 0.06*    Microbiology Results  Results for orders placed or performed during the hospital encounter of 09/30/15  Culture, blood (routine x 2)     Status: None   Collection Time: 09/30/15  2:20 PM  Result Value Ref Range Status   Specimen Description BLOOD LEFT WRIST  Final   Special Requests BOTTLES DRAWN AEROBIC AND ANAEROBIC  1CC   Final   Culture NO GROWTH 5 DAYS  Final   Report Status 10/05/2015 FINAL  Final  Culture, blood (routine x 2)     Status: None   Collection Time: 09/30/15  2:31 PM  Result Value Ref Range Status   Specimen Description BLOOD LEFT UPPERARM  Final   Special Requests BOTTLES DRAWN AEROBIC AND ANAEROBIC  1CC  Final   Culture NO GROWTH 5 DAYS  Final   Report Status 10/05/2015 FINAL  Final  C difficile quick scan w PCR reflex     Status: None   Collection Time: 10/02/15  6:03 PM  Result Value Ref Range Status   C Diff antigen NEGATIVE NEGATIVE Final   C Diff toxin NEGATIVE NEGATIVE Final   C Diff interpretation Negative for C. difficile  Final  Stool culture     Status: None (Preliminary result)   Collection Time: 10/03/15 10:03 AM  Result Value Ref Range Status   Specimen Description STOOL  Final   Special Requests NONE  Final   Culture   Final    NO SALMONELLA OR SHIGELLA ISOLATED NO CAMPYLOBACTER DETECTED    Report Status PENDING  Incomplete  Stool culture     Status: None (Preliminary result)   Collection Time: 10/03/15  5:13 PM  Result Value Ref Range Status   Specimen Description STOOL  Final   Special Requests NONE  Final   Culture   Final    HOLDING FOR POSSIBLE PATHOGEN No Pathogenic E. coli detected NO CAMPYLOBACTER DETECTED NO SALMONELLA OR SHIGELLA ISOLATED    Report Status PENDING  Incomplete    RADIOLOGY:  No results found.  EKG:   Orders placed or performed during the hospital encounter of 09/30/15  . EKG 12-Lead  . EKG 12-Lead  . EKG 12-Lead  . EKG 12-Lead  . EKG 12-Lead  . EKG 12-Lead      Management plans discussed with the patient, family and they are in agreement.  CODE STATUS:     Code Status Orders        Start     Ordered   09/30/15 1604  Full code   Continuous     09/30/15 1604      TOTAL TIME TAKING CARE OF THIS PATIENT: 36 minutes.    Chloee Tena M.D on 10/05/2015 at 3:10 PM  Between 7am to 6pm - Pager -  [phone]  After 6pm go to [email or web address] - password [email or web address] [facility]  [facility] [facility]  Office  [phone]  CC: Primary care physician; Suan Halter, MD

## 2015-10-06 LAB — STOOL CULTURE

## 2015-11-24 ENCOUNTER — Encounter: Payer: Self-pay | Admitting: Emergency Medicine

## 2015-11-24 ENCOUNTER — Emergency Department
Admission: EM | Admit: 2015-11-24 | Discharge: 2015-11-24 | Disposition: A | Discharge status: Home / Self Care | Payer: Self-pay | Attending: Emergency Medicine | Admitting: Emergency Medicine

## 2015-11-24 ENCOUNTER — Emergency Department: Payer: Self-pay

## 2015-11-24 DIAGNOSIS — E46 Unspecified protein-calorie malnutrition: Secondary | ICD-10-CM | POA: Insufficient documentation

## 2015-11-24 DIAGNOSIS — I10 Essential (primary) hypertension: Secondary | ICD-10-CM | POA: Insufficient documentation

## 2015-11-24 DIAGNOSIS — R42 Dizziness and giddiness: Secondary | ICD-10-CM | POA: Insufficient documentation

## 2015-11-24 DIAGNOSIS — F17210 Nicotine dependence, cigarettes, uncomplicated: Secondary | ICD-10-CM | POA: Insufficient documentation

## 2015-11-24 DIAGNOSIS — R5383 Other fatigue: Secondary | ICD-10-CM | POA: Insufficient documentation

## 2015-11-24 DIAGNOSIS — Z7951 Long term (current) use of inhaled steroids: Secondary | ICD-10-CM | POA: Insufficient documentation

## 2015-11-24 DIAGNOSIS — R55 Syncope and collapse: Secondary | ICD-10-CM | POA: Insufficient documentation

## 2015-11-24 LAB — CBC WITH DIFFERENTIAL/PLATELET
BASOS PCT: 1 %
Basophils Absolute: 0 10*3/uL (ref 0–0.1)
Eosinophils Absolute: 0 10*3/uL (ref 0–0.7)
Eosinophils Relative: 1 %
HEMATOCRIT: 39.7 % (ref 35.0–47.0)
Hemoglobin: 12.4 g/dL (ref 12.0–16.0)
LYMPHS PCT: 15 %
Lymphs Abs: 1.2 10*3/uL (ref 1.0–3.6)
MCH: 26.1 pg (ref 26.0–34.0)
MCHC: 31.3 g/dL — AB (ref 32.0–36.0)
MCV: 83.4 fL (ref 80.0–100.0)
MONO ABS: 0.7 10*3/uL (ref 0.2–0.9)
MONOS PCT: 9 %
NEUTROS ABS: 5.8 10*3/uL (ref 1.4–6.5)
Neutrophils Relative %: 74 %
Platelets: 326 10*3/uL (ref 150–440)
RBC: 4.76 MIL/uL (ref 3.80–5.20)
RDW: 15 % — AB (ref 11.5–14.5)
WBC: 7.8 10*3/uL (ref 3.6–11.0)

## 2015-11-24 LAB — COMPREHENSIVE METABOLIC PANEL
ALBUMIN: 3 g/dL — AB (ref 3.5–5.0)
ALT: 8 U/L — AB (ref 14–54)
AST: 15 U/L (ref 15–41)
Alkaline Phosphatase: 87 U/L (ref 38–126)
Anion gap: 8 (ref 5–15)
BILIRUBIN TOTAL: 0.1 mg/dL — AB (ref 0.3–1.2)
BUN: 11 mg/dL (ref 6–20)
CO2: 28 mmol/L (ref 22–32)
CREATININE: 0.52 mg/dL (ref 0.44–1.00)
Calcium: 8.9 mg/dL (ref 8.9–10.3)
Chloride: 102 mmol/L (ref 101–111)
GFR calc Af Amer: >60 mL/min (ref >60)
GFR calc non Af Amer: >60 mL/min (ref >60)
GLUCOSE: 117 mg/dL — AB (ref 65–99)
POTASSIUM: 3.6 mmol/L (ref 3.5–5.1)
Sodium: 138 mmol/L (ref 135–145)
TOTAL PROTEIN: 7 g/dL (ref 6.5–8.1)

## 2015-11-24 LAB — TROPONIN I: Troponin I: <0.03 ng/mL (ref <0.031)

## 2015-11-24 LAB — MAGNESIUM: Magnesium: 1.9 mg/dL (ref 1.7–2.4)

## 2015-11-24 MED ORDER — SODIUM CHLORIDE 0.9 % IV BOLUS (SEPSIS)
1000.0000 mL | INTRAVENOUS | Status: AC
  Administered 2015-11-24: 1000 mL via INTRAVENOUS

## 2015-11-24 NOTE — ED Notes (Signed)
IV removed from left forearm and right forearm LM EDT

## 2015-11-24 NOTE — ED Notes (Signed)
Ortho vital signs  Sitting BP 115/71 P 83 O2 95 Stand BP 111/68 P 96 O2 95 Laying BP 112/67 P 82 O2 94

## 2015-11-24 NOTE — ED Provider Notes (Signed)
[facility] Emergency Department Provider Note  ____________________________________________  Time seen: Approximately 4:24 PM  I have reviewed the triage vital signs and the nursing notes.   HISTORY  Chief Complaint Dizziness; Fatigue; and Near Syncope    HPI Ashley [name] is a 59 y.o. female with a past medical history that includes COPD, hypertension, and anxiety, and he was hospitalized twice last month, first for community-acquired pneumonia and then for healthcare associated pneumonia, presents today for persistent lightheadedness, generalized weakness, and several episodes of near syncope over the last couple of days.  She states that she has never gotten back to her baseline after her recent hospitalizations.  The generalized weakness and episodes of near syncope have been much worse over the last couple of days.  She is not fully lost consciousness and there is a prodrome of dizziness and "blackness" that allows her to lie down before the episodes happen to prevent her from passing out.  She describes her symptoms as moderate.  Nothing makes it better nor worse.   Past Medical History  Diagnosis Date  . COPD (chronic obstructive pulmonary disease) (HCC)   . Hypertension   . Anxiety     Patient Active Problem List   Diagnosis Date Noted  . Diarrhea 10/05/2015  . Protein-calorie malnutrition, severe 10/02/2015  . Malnutrition (HCC) 10/02/2015  . Pressure ulcer 10/01/2015  . SVT (supraventricular tachycardia) (HCC) 09/30/2015  . Pneumonia 09/30/2015  . Chest pain 09/22/2015    Past Surgical History  Procedure Laterality Date  . Abdominal hysterectomy      Current Outpatient Rx  Name  Route  Sig  Dispense  Refill  . albuterol (PROAIR HFA) 108 (90 BASE) MCG/ACT inhaler   Inhalation   Inhale 2 puffs into the lungs every 6 (six) hours as needed for wheezing or shortness of breath.          . Fluticasone-Salmeterol (ADVAIR DISKUS) 500-50  MCG/DOSE AEPB   Inhalation   Inhale 1 puff into the lungs 2 (two) times daily.          Marland Kitchen. ipratropium-albuterol (DUONEB) 0.5-2.5 (3) MG/3ML SOLN   Inhalation   Inhale 3 mLs into the lungs 4 (four) times daily as needed (for shortness of breath and/or wheezing).          Marland Kitchen. tiotropium (SPIRIVA) 18 MCG inhalation capsule   Inhalation   Place 1 capsule into inhaler and inhale daily.         Marland Kitchen. acidophilus (RISAQUAD) CAPS capsule   Oral   Take 1 capsule by mouth 2 (two) times daily. Patient not taking: Reported on 11/24/2015   10 capsule   0   . amiodarone (PACERONE) 200 MG tablet   Oral   Take 1 tablet (200 mg total) by mouth daily. Patient not taking: Reported on 11/24/2015   30 tablet   0   . citalopram (CELEXA) 20 MG tablet   Oral   Take 1 tablet (20 mg total) by mouth daily. Patient not taking: Reported on 11/24/2015   30 tablet   1   . levofloxacin (LEVAQUIN) 250 MG tablet   Oral   Take 1 tablet (250 mg total) by mouth daily. Patient not taking: Reported on 11/24/2015   3 tablet   0   . predniSONE (DELTASONE) 10 MG tablet   Oral   Take 1 tablet (10 mg total) by mouth daily with breakfast. 4 tabs PO x 1 day 3 tabs PO x 1 day 2 tabs  PO x 1 day 1 tab PO x 1 day and stop Patient not taking: Reported on 11/24/2015   10 tablet   0     Allergies Review of patient's allergies indicates no known allergies.  Family History  Problem Relation Age of Onset  . CAD Mother   . CAD Father   . Cancer - Prostate Brother   . Cancer - Lung Brother     Social History Social History  Substance Use Topics  . Smoking status: Current Every Day Smoker -- 0.50 packs/day    Types: Cigarettes  . Smokeless tobacco: None  . Alcohol Use: No    Review of Systems Constitutional: No fever/chills.  Generalized weakness/headedness Eyes: No visual changes. ENT: No sore throat. Cardiovascular: Denies chest pain. Respiratory: Denies shortness of breath. Gastrointestinal: No  abdominal pain.  No nausea, no vomiting.  No diarrhea.  No constipation. Genitourinary: Negative for dysuria. Musculoskeletal: Negative for back pain. Skin: Negative for rash. Neurological: Negative for headaches, focal weakness or numbness.  Denies weakness.  Near-syncope 2  10-point ROS otherwise negative.  ____________________________________________   PHYSICAL EXAM:  VITAL SIGNS: ED Triage Vitals  Enc Vitals Group     BP 11/24/15 1354 103/67 mmHg     Pulse Rate 11/24/15 1354 103     Resp 11/24/15 1354 20     Temp 11/24/15 1354 97.7 F (36.5 C)     Temp Source 11/24/15 1354 Oral     SpO2 11/24/15 1354 95 %     Weight 11/24/15 1354 70 lb 6 oz (31.922 kg)     Height 11/24/15 1354  (1.575 m)     Head Cir --      Peak Flow --      Pain Score 11/24/15 1411 0     Pain Loc --      Pain Edu? --      Excl. in GC? --     Constitutional: Alert and oriented.  Cachectic but in no acute distress  Eyes: Conjunctivae are normal. PERRL. EOMI. Head: Atraumatic. Nose: No congestion/rhinnorhea. Mouth/Throat: Mucous membranes are moist.  Oropharynx non-erythematous. Neck: No stridor.   Cardiovascular: Normal rate, regular rhythm. Grossly normal heart sounds.  Good peripheral circulation. Respiratory: Normal respiratory effort.  No retractions. Lungs CTAB. Gastrointestinal: Soft and nontender. No distention. No abdominal bruits. No CVA tenderness. Musculoskeletal: No lower extremity tenderness nor edema.  No joint effusions. Neurologic:  Normal speech and language. No gross focal neurologic deficits are appreciated.  Skin:  Skin is warm, dry and intact. No rash noted. Psychiatric: Mood and affect are normal. Speech and behavior are normal.  ____________________________________________   LABS (all labs ordered are listed, but only abnormal results are displayed)  Labs Reviewed  COMPREHENSIVE METABOLIC PANEL - Abnormal; Notable for the following:    Glucose, Bld 117 (*)     Albumin 3.0 (*)    ALT 8 (*)    Total Bilirubin 0.1 (*)    All other components within normal limits  CBC WITH DIFFERENTIAL/PLATELET - Abnormal; Notable for the following:    MCHC 31.3 (*)    RDW 15.0 (*)    All other components within normal limits  MAGNESIUM  TROPONIN I  URINALYSIS COMPLETEWITH MICROSCOPIC ([facility] ONLY)   ____________________________________________  EKG  ED ECG REPORT I, Yasmine Kilbourne, the attending physician, personally viewed and interpreted this ECG.  Date: 11/24/2015 EKG Time: 14:02 Rate: 102 Rhythm: borderline sinus tachycardia QRS Axis: normal Intervals: normal ST/T Wave abnormalities: normal Conduction  Disutrbances: none Narrative Interpretation: unremarkable  ____________________________________________  RADIOLOGY   Dg Chest 2 View  11/24/2015  CLINICAL DATA:  Severe emphysema, weakness, shortness of breath, previous smoker EXAM: CHEST  2 VIEW COMPARISON:  10/03/2015 FINDINGS: Severe hyperinflation and hyperlucency compatible with background COPD/ emphysema. Left upper lobe peripheral subpleural wedge-shaped consolidation/collapse has developed since the prior study compatible with consolidative pneumonia and/or atelectasis. A component of mucous plugging is also suspected. Right lung remains clear. No effusion, edema or pneumothorax. Atherosclerosis of the aorta. Normal heart size. IMPRESSION: Left upper lobe wedge-shaped consolidation/partial collapse compatible with pneumonia and/or atelectasis. Mucous plugging is also suspected. Severe background COPD/ emphysema with hyperinflation and hyperlucency. Electronically Signed   By: Judie Petit.  Shick M.D.   On: 11/24/2015 16:54    ____________________________________________   PROCEDURES  Procedure(s) performed: None  Critical Care performed: No ____________________________________________   INITIAL IMPRESSION / ASSESSMENT AND PLAN / ED COURSE  Pertinent labs & imaging results that were available  during my care of the patient were reviewed by me and considered in my medical decision making (see chart for details).  No acute distress, has a appearance of chronic illness.  Given her recent bout with pneumonia I will start with a two-view chest x-ray and obtain a scan if needed.  I am checking basic labs and providing a liter of normal saline because I suspect that her near syncope and tachycardia (120 with ambulation) are due to volume depletion.  I will reassess after workup.  ----------------------------------------- 7:52 PM on 11/24/2015 -----------------------------------------  The patient has been asymptomatic after her liter of fluids.  After her liter of fluids we tested orthostatics and they were negative.  The patient states she feels well and just wanted to make sure she did not have anything new going on.  I explained to her that the area of infiltrate or collapsed lung seen on the x-ray appears to be exactly the same spot that was seen previously on radiographs and on her CT angios that occurred several months ago.  Given that all of the rest of her labs and vital signs are normal, I do not believe this represents a new infiltrate.  Given her chronic lung disease and the fact that there is an abnormality, I will give her a course of azithromycin just in case she has an atypical infection in that area, but I do not believe this represents an acute community-acquired or healthcare associated pneumonia.  She feels well at this time and wants to go home.  I encouraged her to follow-up with her regular doctor at the next available opportunity.  I gave my usual and customary return precautions.     ____________________________________________  FINAL CLINICAL IMPRESSION(S) / ED DIAGNOSES  Final diagnoses:  Near syncope  Malnutrition (HCC)      NEW MEDICATIONS STARTED DURING THIS VISIT:  New Prescriptions   No medications on file     Loleta Rose, MD 11/24/15 [phone]

## 2015-11-24 NOTE — ED Notes (Signed)
Ems pt to the lobby , lightheaded, dizzy, near syncope today,   Nausea was given 4mg  zofran, 22 gauge IV , CBG 109, 132/90 ,pulse 120

## 2015-11-24 NOTE — Discharge Instructions (Signed)
You have been seen today in the Emergency Department (ED)  for near syncope (alomst passing out).  Your workup including labs and EKG show reassuring results.  Your symptoms may be due to dehydration, so it is important that you drink plenty of non-alcoholic fluids.  We also encouraged to eat as well as you can.  You continue to suffer from at least some degree of malnutrition.  Please read through the included information and discuss this with your primary care doctor  As we discussed, we do not believe that you have a new pneumonia in your left upper lobe.  Your x-ray today suggested some chronic collapse in that area which is exactly where you had infection and some lung changes on prior imaging studies.  Just to be safe we gave you a short course of azithromycin, but I do not believe you have a new infection in that area.  I encouraged her to follow up with your regular doctor and discuss whether repeat x-rays in 1-2 weeks are appropriate.  Please call your regular doctor as soon as possible to schedule the next available clinic appointment to follow up with him/her regarding your visit to the ED and your symptoms.  Return to the Emergency Department (ED)  if you have any further syncopal episodes (pass out again) or develop ANY chest pain, pressure, tightness, trouble breathing, sudden sweating, or other symptoms that concern you.   Near-Syncope Near-syncope (commonly known as near fainting) is sudden weakness, dizziness, or feeling like you might pass out. During an episode of near-syncope, you may also develop pale skin, have tunnel vision, or feel sick to your stomach (nauseous). Near-syncope may occur when getting up after sitting or while standing for a long time. It is caused by a sudden decrease in blood flow to the brain. This decrease can result from various causes or triggers, most of which are not serious. However, because near-syncope can sometimes be a sign of something serious, a medical  evaluation is required. The specific cause is often not determined. HOME CARE INSTRUCTIONS  Monitor your condition for any changes. The following actions may help to alleviate any discomfort you are experiencing:  Have someone stay with you until you feel stable.  Lie down right away and prop your feet up if you start feeling like you might faint. Breathe deeply and steadily. Wait until all the symptoms have passed. Most of these episodes last only a few minutes. You may feel tired for several hours.   Drink enough fluids to keep your urine clear or pale yellow.   If you are taking blood pressure or heart medicine, get up slowly when seated or lying down. Take several minutes to sit and then stand. This can reduce dizziness.  Follow up with your health care provider as directed. SEEK IMMEDIATE MEDICAL CARE IF:   You have a severe headache.   You have unusual pain in the chest, abdomen, or back.   You are bleeding from the mouth or rectum, or you have black or tarry stool.   You have an irregular or very fast heartbeat.   You have repeated fainting or have seizure-like jerking during an episode.   You faint when sitting or lying down.   You have confusion.   You have difficulty walking.   You have severe weakness.   You have vision problems.  MAKE SURE YOU:   Understand these instructions.  Will watch your condition.  Will get help right away if you are  not doing well or get worse.   This information is not intended to replace advice given to you by your health care provider. Make sure you discuss any questions you have with your health care provider.   Document Released: 12/10/2005 Document Revised: 12/15/2013 Document Reviewed: 05/15/2013 Elsevier Interactive Patient Education 2016 Elsevier Inc.  Malnutrition Malnutrition is any condition in which nutrition is poor. There are many forms of malnutrition. A common form is having too little of one kind of  nutrient (nutritional deficiency). Nutrients include proteins, minerals, carbohydrates, fats, and vitamins. They provide the body with energy and keep the body working normally. Malnutrition ranges from mild to severe. The condition affects the body's defense system (immune system). Because of this, people who are malnourished are more likely to develop health problems and get sick. CAUSES Causes of malnutrition include:  Eating an unbalanced diet.  Eating too much of certain foods.  Eating too little.  Conditions that decrease the body's ability to use nutrients. RISK FACTORS Risk factors include:  Pregnancy and lactation. Women who are pregnant may become malnourished if they do not increase their nutrient intake. They are also susceptible to folic acid deficiency.  Increasing age. The body's ability to absorb nutrients decreases with age. This can contribute to iron, calcium, and vitamin D deficiencies.  Alcohol or drug dependency. Addiction often leads to a lifestyle in which proper nourishment is ignored. Dependency can also hurt the metabolism and the body's ability to absorb nutrients. Alcoholism is a major cause of thiamine deficiency and can lead to deficiencies of magnesium, zinc, and other vitamins.  Eating disorders, such as anorexia nervosa. People with these disorders may eat too little or too much.  Chewing or swallowing problems. People with these disorders may not eat enough.  Certain diseases, including:  Long-lasting (chronic) diseases. Chronic diseases tend to affect the absorption of calcium, iron, and vitamins B12, A, D, E, and K.  Liver disease. Liver disease affects the storage of vitamins A and B12. It also interferes with the metabolism of protein and energy sources.  Kidney disease. Kidney disease may cause deficiencies of protein, iron, and vitamin D.  Cancer or AIDS. These diseases can cause a loss of appetite.  Cystic fibrosis. This disease can make it  difficult for the body to absorb nutrients.  Certain diets, including.  The vegetarian diet. Vegetarians are at risk for iron deficiency.  The vegan diet. Vegans are susceptible to vitamin B12, calcium, iron, vitamin D, and zinc deficiencies.  The fruitarian diet. This diet can be deficient in protein, sodium, and many micronutrients.  Many commercial "fad" diets, including those that claim to enhance well-being and reduce weight.  Very low calorie diets.  Low income. People with a low income may have trouble paying for nutritious foods. SIGNS AND SYMPTOMS Signs and symptoms depend on the kind of malnutrition you have. Common symptoms include:  Fatigue.  Weakness.  Dizziness.  Fainting  Weight loss.  Poor immune response.  Lack of menstruation.  Hair loss.  Poor memory. DIAGNOSIS  Malnutrition may be diagnosed by:  A medical history.  A dietary history.  A physical exam. This may include a measurement of your body mass index (BMI).  Blood tests. TREATMENT  Treatments vary depending on the cause of the malnutrition. Common treatments include:  Dietary changes.  Dietary supplements, such as vitamins and minerals.  Treatment of any underlying conditions. HOME CARE INSTRUCTIONS  Eat a balanced diet.  Take dietary supplements as directed by your  health care provider.  Exercise regularly. Exercising can improve appetite.  Keep all follow-up visits as directed by your health care provider. This is important. PREVENTION Eating a well-balanced diet helps to prevent most forms of malnutrition. SEEK MEDICAL CARE IF:  You have increased weakness or fatigue.  You faint.  You stop menstruating.  You have rapid hair loss.  You have unexpected weight loss.   This information is not intended to replace advice given to you by your health care provider. Make sure you discuss any questions you have with your health care provider.   Document Released:  10/26/2005 Document Revised: 12/31/2014 Document Reviewed: 08/06/2014 Elsevier Interactive Patient Education [employer]2016 Elsevier Inc.

## 2016-05-31 ENCOUNTER — Observation Stay
Admission: EM | Admit: 2016-05-31 | Discharge: 2016-06-02 | Disposition: A | Discharge status: Home / Self Care | Payer: Self-pay | Attending: Internal Medicine | Admitting: Internal Medicine

## 2016-05-31 ENCOUNTER — Encounter: Payer: Self-pay | Admitting: Emergency Medicine

## 2016-05-31 ENCOUNTER — Emergency Department: Payer: Self-pay

## 2016-05-31 DIAGNOSIS — Z79899 Other long term (current) drug therapy: Secondary | ICD-10-CM | POA: Insufficient documentation

## 2016-05-31 DIAGNOSIS — Z7951 Long term (current) use of inhaled steroids: Secondary | ICD-10-CM | POA: Insufficient documentation

## 2016-05-31 DIAGNOSIS — F17210 Nicotine dependence, cigarettes, uncomplicated: Secondary | ICD-10-CM | POA: Insufficient documentation

## 2016-05-31 DIAGNOSIS — F419 Anxiety disorder, unspecified: Secondary | ICD-10-CM | POA: Insufficient documentation

## 2016-05-31 DIAGNOSIS — Z801 Family history of malignant neoplasm of trachea, bronchus and lung: Secondary | ICD-10-CM | POA: Insufficient documentation

## 2016-05-31 DIAGNOSIS — J449 Chronic obstructive pulmonary disease, unspecified: Secondary | ICD-10-CM | POA: Diagnosis present

## 2016-05-31 DIAGNOSIS — Z8042 Family history of malignant neoplasm of prostate: Secondary | ICD-10-CM | POA: Insufficient documentation

## 2016-05-31 DIAGNOSIS — I10 Essential (primary) hypertension: Secondary | ICD-10-CM | POA: Insufficient documentation

## 2016-05-31 DIAGNOSIS — J441 Chronic obstructive pulmonary disease with (acute) exacerbation: Secondary | ICD-10-CM | POA: Insufficient documentation

## 2016-05-31 DIAGNOSIS — Z90710 Acquired absence of both cervix and uterus: Secondary | ICD-10-CM | POA: Insufficient documentation

## 2016-05-31 DIAGNOSIS — E43 Unspecified severe protein-calorie malnutrition: Principal | ICD-10-CM | POA: Insufficient documentation

## 2016-05-31 DIAGNOSIS — Z888 Allergy status to other drugs, medicaments and biological substances status: Secondary | ICD-10-CM | POA: Insufficient documentation

## 2016-05-31 DIAGNOSIS — Z8249 Family history of ischemic heart disease and other diseases of the circulatory system: Secondary | ICD-10-CM | POA: Insufficient documentation

## 2016-05-31 DIAGNOSIS — Z885 Allergy status to narcotic agent status: Secondary | ICD-10-CM | POA: Insufficient documentation

## 2016-05-31 LAB — CBC WITH DIFFERENTIAL/PLATELET
Basophils Absolute: 0 10*3/uL (ref 0–0.1)
Basophils Relative: 0 %
Eosinophils Absolute: 0.3 10*3/uL (ref 0–0.7)
Eosinophils Relative: 4 %
HEMATOCRIT: 40.9 % (ref 35.0–47.0)
HEMOGLOBIN: 13.8 g/dL (ref 12.0–16.0)
LYMPHS ABS: 1.6 10*3/uL (ref 1.0–3.6)
Lymphocytes Relative: 24 %
MCH: 29.8 pg (ref 26.0–34.0)
MCHC: 33.9 g/dL (ref 32.0–36.0)
MCV: 88 fL (ref 80.0–100.0)
MONO ABS: 0.5 10*3/uL (ref 0.2–0.9)
MONOS PCT: 7 %
NEUTROS ABS: 4.4 10*3/uL (ref 1.4–6.5)
NEUTROS PCT: 65 %
Platelets: 244 10*3/uL (ref 150–440)
RBC: 4.64 MIL/uL (ref 3.80–5.20)
RDW: 13.5 % (ref 11.5–14.5)
WBC: 6.9 10*3/uL (ref 3.6–11.0)

## 2016-05-31 LAB — BASIC METABOLIC PANEL
Anion gap: 8 (ref 5–15)
BUN: 9 mg/dL (ref 6–20)
CALCIUM: 9.3 mg/dL (ref 8.9–10.3)
CHLORIDE: 107 mmol/L (ref 101–111)
CO2: 26 mmol/L (ref 22–32)
CREATININE: 0.49 mg/dL (ref 0.44–1.00)
GFR calc non Af Amer: >60 mL/min (ref >60)
GLUCOSE: 113 mg/dL — AB (ref 65–99)
Potassium: 3.4 mmol/L — ABNORMAL LOW (ref 3.5–5.1)
Sodium: 141 mmol/L (ref 135–145)

## 2016-05-31 LAB — TROPONIN I: Troponin I: <0.03 ng/mL (ref <0.031)

## 2016-05-31 MED ORDER — AZITHROMYCIN 500 MG PO TABS
500.0000 mg | ORAL_TABLET | Freq: Once | ORAL | Status: AC
  Administered 2016-05-31: 500 mg via ORAL
  Filled 2016-05-31: qty 1

## 2016-05-31 MED ORDER — METHYLPREDNISOLONE SODIUM SUCC 125 MG IJ SOLR
125.0000 mg | Freq: Once | INTRAMUSCULAR | Status: AC
  Administered 2016-05-31: 125 mg via INTRAVENOUS
  Filled 2016-05-31: qty 2

## 2016-05-31 MED ORDER — IPRATROPIUM-ALBUTEROL 0.5-2.5 (3) MG/3ML IN SOLN
9.0000 mL | Freq: Once | RESPIRATORY_TRACT | Status: AC
  Administered 2016-05-31: 9 mL via RESPIRATORY_TRACT
  Filled 2016-05-31: qty 9

## 2016-05-31 NOTE — ED Notes (Signed)
Pt presents to ED via EMS with c/o SOB and wheeze since 400 this morning. Per EMS pt taken albuterol inhaler prior to arrival, CBG-105, Hx of COPD, everyday smoker. Initial SpO2 98% on room air.

## 2016-05-31 NOTE — ED Provider Notes (Signed)
[facility] Emergency Department Provider Note  ____________________________________________  Time seen: Seen upon arrival to the emergency department  I have reviewed the triage vital signs and the nursing notes.   HISTORY  Chief Complaint Shortness of Breath   HPI Ashley [name] is a 60 y.o. female with a history of COPD who is still smoking who is presenting to the emergency department with shortness of breath that started this morning. She says that she does not have a cough or fever. Says that this feels typical for shortness of breath. Denies any pain and doesn't have a tightness in her chest which she says is also typical of her previous episodes of COPD flare. Says that steroids usually help her. Has been taking her inhaler at home without any relief. Lowest prehospital oxygen saturation was 94%. Placed on nasal cannula oxygen. Given albuterol neb in route.Patient says that usually when the temperature fluctuates that she gets a COPD flare and the temperature has been lower this week. Says that she also has been sitting outside of her porch this week.   Past Medical History  Diagnosis Date  . COPD (chronic obstructive pulmonary disease) (HCC)   . Hypertension   . Anxiety     Patient Active Problem List   Diagnosis Date Noted  . Diarrhea 10/05/2015  . Protein-calorie malnutrition, severe 10/02/2015  . Malnutrition (HCC) 10/02/2015  . Pressure ulcer 10/01/2015  . SVT (supraventricular tachycardia) (HCC) 09/30/2015  . Pneumonia 09/30/2015  . Chest pain 09/22/2015    Past Surgical History  Procedure Laterality Date  . Abdominal hysterectomy      Current Outpatient Rx  Name  Route  Sig  Dispense  Refill  . albuterol (PROAIR HFA) 108 (90 BASE) MCG/ACT inhaler   Inhalation   Inhale 2 puffs into the lungs every 6 (six) hours as needed for wheezing or shortness of breath.          . Fluticasone-Salmeterol (ADVAIR DISKUS) 500-50 MCG/DOSE AEPB   Inhalation   Inhale 1 puff into the lungs 2 (two) times daily.          Marland Kitchen ipratropium-albuterol (DUONEB) 0.5-2.5 (3) MG/3ML SOLN   Inhalation   Inhale 3 mLs into the lungs 4 (four) times daily as needed (for shortness of breath and/or wheezing).          Marland Kitchen tiotropium (SPIRIVA) 18 MCG inhalation capsule   Inhalation   Place 1 capsule into inhaler and inhale daily.           Allergies Codeine and Promethazine  Family History  Problem Relation Age of Onset  . CAD Mother   . CAD Father   . Cancer - Prostate Brother   . Cancer - Lung Brother     Social History Social History  Substance Use Topics  . Smoking status: Current Every Day Smoker -- 0.50 packs/day    Types: Cigarettes  . Smokeless tobacco: None  . Alcohol Use: No    Review of Systems Constitutional: No fever/chills Eyes: No visual changes. ENT: No sore throat. Cardiovascular: Denies chest pain. Respiratory: As above Gastrointestinal: No abdominal pain.  No nausea, no vomiting.  No diarrhea.  No constipation. Genitourinary: Negative for dysuria. Musculoskeletal: Negative for back pain. Skin: Negative for rash. Neurological: Negative for headaches, focal weakness or numbness.  10-point ROS otherwise negative.  ____________________________________________   PHYSICAL EXAM:  VITAL SIGNS: ED Triage Vitals  Enc Vitals Group     BP --  Pulse --      Resp --      Temp --      Temp src --      SpO2 --      Weight --      Height --      Head Cir --      Peak Flow --      Pain Score --      Pain Loc --      Pain Edu? --      Excl. in GC? --     Constitutional: Alert and oriented. Well appearing and in no acute distress. Eyes: Conjunctivae are normal. PERRL. EOMI. Head: Atraumatic. Nose: No congestion/rhinnorhea. Mouth/Throat: Mucous membranes are moist.  Neck: No stridor.   Cardiovascular: Normal rate, regular rhythm. Grossly normal heart sounds.   Respiratory: Normal respiratory  effort.  No retractions.Severely decreased lung sounds throughout but patient still speaking in full sentences. Very mild coarse wheezing throughout. Gastrointestinal: Soft and nontender. No distention.  Musculoskeletal: No lower extremity tenderness nor edema.  No joint effusions. Neurologic:  Normal speech and language. No gross focal neurologic deficits are appreciated.  Skin:  Skin is warm, dry and intact. No rash noted. Psychiatric: Mood and affect are normal. Speech and behavior are normal.  ____________________________________________   LABS (all labs ordered are listed, but only abnormal results are displayed)  Labs Reviewed  BASIC METABOLIC PANEL - Abnormal; Notable for the following:    Potassium 3.4 (*)    Glucose, Bld 113 (*)    All other components within normal limits  CBC WITH DIFFERENTIAL/PLATELET  TROPONIN I   ____________________________________________  EKG  ED ECG REPORT I, Arelia Longest, the attending physician, personally viewed and interpreted this ECG.   Date: 05/31/2016  EKG Time: 1958  Rate: 98  Rhythm: normal sinus rhythm  Axis: Normal  Intervals:none  ST&T Change: No ST segment elevation or depression. Single T-wave inversion in aVL. EKG appears unchanged from 11/24/2015. ____________________________________________  RADIOLOGY  DG Chest 1 View (Final result) Result time: 05/31/16 20:18:42   Final result by Rad Results In Interface (05/31/16 20:18:42)   Narrative:   CLINICAL DATA: Shortness of Breath  EXAM: CHEST 1 VIEW  COMPARISON: 11/24/2015  FINDINGS: The cardiac shadow is within normal limits. The lungs are well aerated bilaterally. Some scarring is noted in the left apex but significantly improved from the prior exam. No new focal infiltrate is seen. No bony abnormality is noted.  IMPRESSION: Chronic changes in the left apex. No acute abnormality noted.   Electronically Signed By: Alcide Clever M.D. On:  05/31/2016 20:18       ____________________________________________   PROCEDURES  ____________________________________________   INITIAL IMPRESSION / ASSESSMENT AND PLAN / ED COURSE  Pertinent labs & imaging results that were available during my care of the patient were reviewed by me and considered in my medical decision making (see chart for details).   ----------------------------------------- 10:12 PM on 05/31/2016 -----------------------------------------  Patient still with poor air movement and mild retractions. This is after 3 duo nebs as well as 1 albuterol neb en route as well as slight Medrol. Will be admitted to the hospital. Signed out to Dr. Haroldine Laws. I also discussed this plan with the patient as well as her husband are understanding of this month to comply.    FINAL CLINICAL IMPRESSION(S) / ED DIAGNOSES  COPD exacerbation.    NEW MEDICATIONS STARTED DURING THIS VISIT:  New Prescriptions   No medications on file  Note:  This document was prepared using Dragon voice recognition software and may include unintentional dictation errors.    Myrna Blazeravid Matthew Schaevitz, MD 05/31/16 2212

## 2016-06-01 DIAGNOSIS — J449 Chronic obstructive pulmonary disease, unspecified: Secondary | ICD-10-CM | POA: Diagnosis present

## 2016-06-01 DIAGNOSIS — I10 Essential (primary) hypertension: Secondary | ICD-10-CM

## 2016-06-01 LAB — BASIC METABOLIC PANEL
Anion gap: 10 (ref 5–15)
BUN: 11 mg/dL (ref 6–20)
CHLORIDE: 105 mmol/L (ref 101–111)
CO2: 26 mmol/L (ref 22–32)
CREATININE: 0.52 mg/dL (ref 0.44–1.00)
Calcium: 9.4 mg/dL (ref 8.9–10.3)
Glucose, Bld: 172 mg/dL — ABNORMAL HIGH (ref 65–99)
POTASSIUM: 3.4 mmol/L — AB (ref 3.5–5.1)
SODIUM: 141 mmol/L (ref 135–145)

## 2016-06-01 LAB — CBC
HCT: 40.1 % (ref 35.0–47.0)
Hemoglobin: 13.6 g/dL (ref 12.0–16.0)
MCH: 30.2 pg (ref 26.0–34.0)
MCHC: 33.9 g/dL (ref 32.0–36.0)
MCV: 89.1 fL (ref 80.0–100.0)
PLATELETS: 210 10*3/uL (ref 150–440)
RBC: 4.5 MIL/uL (ref 3.80–5.20)
RDW: 13.7 % (ref 11.5–14.5)
WBC: 5.5 10*3/uL (ref 3.6–11.0)

## 2016-06-01 LAB — FIBRIN DERIVATIVES D-DIMER (ARMC ONLY): FIBRIN DERIVATIVES D-DIMER (ARMC): 284 (ref 0–499)

## 2016-06-01 MED ORDER — ENOXAPARIN SODIUM 30 MG/0.3ML ~~LOC~~ SOLN
30.0000 mg | Freq: Every day | SUBCUTANEOUS | Status: DC
  Administered 2016-06-01 (×2): 30 mg via SUBCUTANEOUS
  Filled 2016-06-01 (×2): qty 0.3

## 2016-06-01 MED ORDER — METHYLPREDNISOLONE SODIUM SUCC 40 MG IJ SOLR
40.0000 mg | Freq: Two times a day (BID) | INTRAMUSCULAR | Status: DC
  Administered 2016-06-02: 01:00:00 40 mg via INTRAVENOUS
  Filled 2016-06-01: qty 1

## 2016-06-01 MED ORDER — METHYLPREDNISOLONE SODIUM SUCC 40 MG IJ SOLR
40.0000 mg | Freq: Three times a day (TID) | INTRAMUSCULAR | Status: DC
  Administered 2016-06-01: 40 mg via INTRAVENOUS
  Filled 2016-06-01: qty 1

## 2016-06-01 MED ORDER — HYDRALAZINE HCL 20 MG/ML IJ SOLN: 5.0000 mg | INTRAMUSCULAR | Status: DC | PRN

## 2016-06-01 MED ORDER — METHYLPREDNISOLONE SODIUM SUCC 40 MG IJ SOLR
40.0000 mg | Freq: Once | INTRAMUSCULAR | Status: AC
  Administered 2016-06-01: 18:00:00 40 mg via INTRAVENOUS
  Filled 2016-06-01: qty 1

## 2016-06-01 MED ORDER — IPRATROPIUM-ALBUTEROL 0.5-2.5 (3) MG/3ML IN SOLN
3.0000 mL | Freq: Three times a day (TID) | RESPIRATORY_TRACT | Status: DC
  Administered 2016-06-01 – 2016-06-02 (×2): 3 mL via RESPIRATORY_TRACT
  Filled 2016-06-01 (×3): qty 3

## 2016-06-01 MED ORDER — ACETAMINOPHEN 325 MG PO TABS
650.0000 mg | ORAL_TABLET | Freq: Four times a day (QID) | ORAL | Status: DC | PRN
Start: 2016-06-01 — End: 2016-06-02

## 2016-06-01 MED ORDER — LORAZEPAM 2 MG/ML IJ SOLN
0.5000 mg | Freq: Once | INTRAMUSCULAR | Status: AC
  Administered 2016-06-01: 0.5 mg via INTRAVENOUS
  Filled 2016-06-01: qty 1

## 2016-06-01 MED ORDER — ACETAMINOPHEN 650 MG RE SUPP: 650.0000 mg | Freq: Four times a day (QID) | RECTAL | Status: DC | PRN

## 2016-06-01 MED ORDER — ONDANSETRON HCL 4 MG PO TABS
4.0000 mg | ORAL_TABLET | Freq: Four times a day (QID) | ORAL | Status: DC | PRN
  Filled 2016-06-01: qty 1

## 2016-06-01 MED ORDER — SODIUM CHLORIDE 0.9% FLUSH
3.0000 mL | Freq: Two times a day (BID) | INTRAVENOUS | Status: DC
  Administered 2016-06-01 – 2016-06-02 (×4): 3 mL via INTRAVENOUS

## 2016-06-01 MED ORDER — POTASSIUM CHLORIDE 20 MEQ PO PACK
40.0000 meq | PACK | Freq: Once | ORAL | Status: AC
  Administered 2016-06-01: 17:00:00 40 meq via ORAL
  Filled 2016-06-01: qty 2

## 2016-06-01 MED ORDER — ONDANSETRON HCL 4 MG/2ML IJ SOLN
4.0000 mg | Freq: Four times a day (QID) | INTRAMUSCULAR | Status: DC | PRN
  Administered 2016-06-01: 4 mg via INTRAVENOUS
  Filled 2016-06-01: qty 2

## 2016-06-01 MED ORDER — IPRATROPIUM-ALBUTEROL 0.5-2.5 (3) MG/3ML IN SOLN: 3.0000 mL | RESPIRATORY_TRACT | Status: DC | PRN

## 2016-06-01 MED ORDER — NICOTINE 14 MG/24HR TD PT24
14.0000 mg | MEDICATED_PATCH | Freq: Every day | TRANSDERMAL | Status: DC
  Administered 2016-06-01 – 2016-06-02 (×2): 14 mg via TRANSDERMAL
  Filled 2016-06-01 (×2): qty 1

## 2016-06-01 MED ORDER — ENSURE ENLIVE PO LIQD
237.0000 mL | Freq: Three times a day (TID) | ORAL | Status: DC
  Administered 2016-06-01 – 2016-06-02 (×4): 237 mL via ORAL

## 2016-06-01 MED ORDER — SENNOSIDES-DOCUSATE SODIUM 8.6-50 MG PO TABS: 1.0000 | ORAL_TABLET | Freq: Every evening | ORAL | Status: DC | PRN

## 2016-06-01 NOTE — Care Management (Addendum)
Admitted to [facility] under observation status with the diagnosis of COPD. Husband is listed as contact person.Ashley [name], [name], [phone]). States she had been going to [facility] for assistance in the past, but was a patient here in October and got Medicaid assistance.  So, she started getting her medications with Medicaid assistant. States she doesn't have Medicaid now. States she would like to go back to [facility]ication Management. Application given. [facility] and sees Loney LaurenceMargaret Campbell NP. No longer uses home oxygen. [facility] October.  Gwenette GreetBrenda S Nikolette Reindl RN MSN CCM Care Management [phone]

## 2016-06-01 NOTE — Clinical Social Work Note (Signed)
CSW consulted for medications assistance. RNCM is following for needs for medication assistance. CSW is signing off as no further needs identified.   Dede QuerySarah Ernestine Rohman, MSW, LCSW  Clinical Social Worker  [phone]

## 2016-06-01 NOTE — Progress Notes (Signed)
[facility] [place] [facility]   PATIENT NAME: Ashley [name]    MR#:  [id number]  DATE OF BIRTH:  06/15/1956  SUBJECTIVE:  CHIEF COMPLAINT:  Patient is resting comfortably. Shortness of breath is better. Intermittent episodes of cough.  REVIEW OF SYSTEMS:  CONSTITUTIONAL: No fever, fatigue or weakness.  EYES: No blurred or double vision.  EARS, NOSE, AND THROAT: No tinnitus or ear pain.  RESPIRATORY: No cough, shortness of breath, wheezing or hemoptysis.  CARDIOVASCULAR: No chest pain, orthopnea, edema.  GASTROINTESTINAL: No nausea, vomiting, diarrhea or abdominal pain.  GENITOURINARY: No dysuria, hematuria.  ENDOCRINE: No polyuria, nocturia,  HEMATOLOGY: No anemia, easy bruising or bleeding SKIN: No rash or lesion. MUSCULOSKELETAL: No joint pain or arthritis.   NEUROLOGIC: No tingling, numbness, weakness.  PSYCHIATRY: No anxiety or depression.   DRUG ALLERGIES:   Allergies  Allergen Reactions  . Codeine Nausea Only  . Promethazine Nausea And Vomiting    VITALS:  Blood pressure 128/62, pulse 95, temperature 98.3 F (36.8 C), temperature source Oral, resp. rate 16, height  (1.575 m), weight 35.551 kg (78 lb 6 oz), SpO2 95 %.  PHYSICAL EXAMINATION:  GENERAL:  60 y.o.-year-old patient lying in the bed with no acute distress.  EYES: Pupils equal, round, reactive to light and accommodation. No scleral icterus. Extraocular muscles intact.  HEENT: Head atraumatic, normocephalic. Oropharynx and nasopharynx clear.  NECK:  Supple, no jugular venous distention. No thyroid enlargement, no tenderness.  LUNGS:   moderate breath sounds bilaterally, no wheezing, rales,rhonchi or crepitation. No use of accessory muscles of respiration.  CARDIOVASCULAR: S1, S2 normal. No murmurs, rubs, or gallops.  ABDOMEN: Soft, nontender, nondistended. Bowel sounds present. No organomegaly or mass.  EXTREMITIES: No pedal edema, cyanosis, or clubbing.  NEUROLOGIC: Cranial  nerves II through XII are intact. Muscle strength 5/5 in all extremities. Sensation intact. Gait not checked.  PSYCHIATRIC: The patient is alert and oriented x 3.  SKIN: No obvious rash, lesion, or ulcer.    LABORATORY PANEL:   CBC  Recent Labs Lab 06/01/16 0422  WBC 5.5  HGB 13.6  HCT 40.1  PLT 210   ------------------------------------------------------------------------------------------------------------------  Chemistries   Recent Labs Lab 06/01/16 0422  NA 141  K 3.4*  CL 105  CO2 26  GLUCOSE 172*  BUN 11  CREATININE 0.52  CALCIUM 9.4   ------------------------------------------------------------------------------------------------------------------  Cardiac Enzymes  Recent Labs Lab 05/31/16 2010  TROPONINI <0.03   ------------------------------------------------------------------------------------------------------------------  RADIOLOGY:  Dg Chest 1 View  05/31/2016  CLINICAL DATA:  Shortness of Breath EXAM: CHEST 1 VIEW COMPARISON:  11/24/2015 FINDINGS: The cardiac shadow is within normal limits. The lungs are well aerated bilaterally. Some scarring is noted in the left apex but significantly improved from the prior exam. No new focal infiltrate is seen. No bony abnormality is noted. IMPRESSION: Chronic changes in the left apex.  No acute abnormality noted. Electronically Signed   By: Alcide Clever M.D.   On: 05/31/2016 20:18    EKG:   Orders placed or performed during the hospital encounter of 05/31/16  . EKG 12-Lead  . EKG 12-Lead    ASSESSMENT AND PLAN:   . acute exacerbation of COPD (chronic obstructive pulmonary disease) (HCC) -Clinically better -Wean off Solu-Medrol, duonebs, oxygen started -No antibiotics ordered, currently -d-dimer normal   . Protein-calorie malnutrition, severe -Stable, and ensure ordered  Hypertension -Stable, not on home medications. Provide low-salt diet  Anxiety -Stable, not on anxiolytics at home  Tobacco  abuse  -  Nicotine patch ordered, counseled patient to quit smoking for her to 5 minutes      All the records are reviewed and case discussed with Care Management/Social Workerr. Management plans discussed with the patient, family and they are in agreement.  CODE STATUS: fc   TOTAL TIME TAKING CARE OF THIS PATIENT: 36  minutes.   POSSIBLE D/C IN 1-2  DAYS, DEPENDING ON CLINICAL CONDITION.  Note: This dictation was prepared with Dragon dictation along with smaller phrase technology. Any transcriptional errors that result from this process are unintentional.   Ramonita LabGouru, Deundra Bard M.D on 06/01/2016 at 3:56 PM  Between 7am to 6pm - Pager - [phone] After 6pm go to [email or web address] - password [email or web address] [facility]  [facility] [place] [facility]  Office  [phone]  CC: Primary care physician; Suan HalterAMPBELL,MARGARET F, MD

## 2016-06-01 NOTE — ED Notes (Signed)
Pt transported to room 124 

## 2016-06-01 NOTE — H&P (Signed)
PCP:   Suan HalterAMPBELL,MARGARET F, MD   Chief Complaint:  Shortness of breath  HPI: This is 60 year old female who states at approximately 4 AM this morning she awoke with sudden onset of shortness of breath. She denies any coughing, fevers or chills. She states she has some mild wheezing. She denies any lower extremity edema. She denies any coronary artery disease history. In route in the ambulance she was given 3 duonebs, in the ER she received 2 as well as steroids and continues to be short of breath. The hospitalist have been asked to admit. She states is typical for COPD exacerbation.  Review of Systems:  The patient denies anorexia, fever, weight loss,, vision loss, decreased hearing, hoarseness, chest pain, syncope, dyspnea on exertion, peripheral edema, balance deficits, hemoptysis, abdominal pain, melena, hematochezia, severe indigestion/heartburn, hematuria, incontinence, genital sores, muscle weakness, suspicious skin lesions, transient blindness, difficulty walking, depression, unusual weight change, abnormal bleeding, enlarged lymph nodes, angioedema, and breast masses.  Past Medical History: Past Medical History  Diagnosis Date  . COPD (chronic obstructive pulmonary disease) (HCC)   . Hypertension   . Anxiety    Past Surgical History  Procedure Laterality Date  . Abdominal hysterectomy      Medications: Prior to Admission medications   Medication Sig Start Date End Date Taking? Authorizing Provider  albuterol (PROAIR HFA) 108 (90 BASE) MCG/ACT inhaler Inhale 2 puffs into the lungs every 6 (six) hours as needed for wheezing or shortness of breath.    Yes Historical Provider, MD  Fluticasone-Salmeterol (ADVAIR DISKUS) 500-50 MCG/DOSE AEPB Inhale 1 puff into the lungs 2 (two) times daily.    Yes Historical Provider, MD  ipratropium-albuterol (DUONEB) 0.5-2.5 (3) MG/3ML SOLN Inhale 3 mLs into the lungs 4 (four) times daily as needed (for shortness of breath and/or wheezing).    Yes  Historical Provider, MD  tiotropium (SPIRIVA) 18 MCG inhalation capsule Place 1 capsule into inhaler and inhale daily.   Yes Historical Provider, MD    Allergies:   Allergies  Allergen Reactions  . Codeine Nausea Only  . Promethazine Nausea And Vomiting    Social History:  reports that she has been smoking Cigarettes.  She has been smoking about 0.50 packs per day. She does not have any smokeless tobacco history on file. She reports that she does not drink alcohol or use illicit drugs.  Family History: Family History  Problem Relation Age of Onset  . CAD Mother   . CAD Father   . Cancer - Prostate Brother   . Cancer - Lung Brother     Physical Exam: Filed Vitals:   05/31/16 2300 05/31/16 2330 06/01/16 0000 06/01/16 0030  BP: 143/78 156/80 144/74 145/88  Pulse: 92 91 95 95  Temp:      TempSrc:      Resp: 20 25 26 26   Height:      Weight:      SpO2: 92% 93% 94% 94%    General:  Alert and oriented times three, well developed and nourished, no acute distress Eyes: PERRLA, pink conjunctiva, no scleral icterus ENT: Moist oral mucosa, neck supple, no thyromegaly Lungs: clear to ascultation, mild wheeze, no crackles, no use of accessory muscles Cardiovascular: regular rate and rhythm, no regurgitation, no gallops, no murmurs. No carotid bruits, no JVD Abdomen: soft, positive BS, non-tender, non-distended, no organomegaly, not an acute abdomen GU: not examined Neuro: CN II - XII grossly intact, sensation intact Musculoskeletal: strength 5/5 all extremities, no clubbing, cyanosis or edema Skin:  no rash, no subcutaneous crepitation, no decubitus Psych: appropriate patient   Labs on Admission:   Recent Labs  05/31/16 2010  NA 141  K 3.4*  CL 107  CO2 26  GLUCOSE 113*  BUN 9  CREATININE 0.49  CALCIUM 9.3   No results for input(s): AST, ALT, ALKPHOS, BILITOT, PROT, ALBUMIN in the last 72 hours. No results for input(s): LIPASE, AMYLASE in the last 72 hours.  Recent  Labs  05/31/16 2010  WBC 6.9  NEUTROABS 4.4  HGB 13.8  HCT 40.9  MCV 88.0  PLT 244    Recent Labs  05/31/16 2010  TROPONINI <0.03   Invalid input(s): POCBNP No results for input(s): DDIMER in the last 72 hours. No results for input(s): HGBA1C in the last 72 hours. No results for input(s): CHOL, HDL, LDLCALC, TRIG, CHOLHDL, LDLDIRECT in the last 72 hours. No results for input(s): TSH, T4TOTAL, T3FREE, THYROIDAB in the last 72 hours.  Invalid input(s): FREET3 No results for input(s): VITAMINB12, FOLATE, FERRITIN, TIBC, IRON, RETICCTPCT in the last 72 hours.  Micro Results: No results found for this or any previous visit (from the past 240 hour(s)).   Radiological Exams on Admission: Dg Chest 1 View  05/31/2016  CLINICAL DATA:  Shortness of Breath EXAM: CHEST 1 VIEW COMPARISON:  11/24/2015 FINDINGS: The cardiac shadow is within normal limits. The lungs are well aerated bilaterally. Some scarring is noted in the left apex but significantly improved from the prior exam. No new focal infiltrate is seen. No bony abnormality is noted. IMPRESSION: Chronic changes in the left apex.  No acute abnormality noted. Electronically Signed   By: Alcide Clever M.D.   On: 05/31/2016 20:18    Assessment/Plan Present on Admission:  . acute exacerbation of COPD (chronic obstructive pulmonary disease) (HCC) -Bring in for 23 hour observation -Solu-Medrol, duonebs, oxygen started -Respiratory to evaluate and treat -No antibiotics ordered, currently -Will add a d-dimer and follow-up with a CTA chest of elevated  Tobacco abuse  -Nicotine patch ordered, patient counseled  . Protein-calorie malnutrition, severe -Stable, and sure ordered  Hypertension -Stable, home medications resumed,. Medications ordered  Anxiety -Stable, home medications resumed   Marce Charlesworth 06/01/2016, 1:02 AM

## 2016-06-01 NOTE — Progress Notes (Signed)
Initial Nutrition Assessment  DOCUMENTATION CODES:   Severe malnutrition in context of chronic illness  INTERVENTION:   Recommend liberalizing diet order to Regular to optimize nutritional intake Recommend Ensure Enlive po TID, each supplement provides 350 kcal and 20 grams of protein Will send Magic Cup at lunch and dinner Will send high calorie/high protein snacks between meals such as cottage cheese, sandwich   NUTRITION DIAGNOSIS:   Malnutrition related to chronic illness as evidenced by severe depletion of muscle mass, severe depletion of body fat.  GOAL:   Patient will meet greater than or equal to 90% of their needs  MONITOR:   PO intake, Supplement acceptance, Labs, Weight trends, Skin, I & O's  REASON FOR ASSESSMENT:    (Low BMI)    ASSESSMENT:   Pt admitted with SOB secondary to COPD exacerbation.  Past Medical History  Diagnosis Date  . COPD (chronic obstructive pulmonary disease) (HCC)   . Hypertension   . Anxiety     Diet Order:  Diet regular Room service appropriate?: Yes; Fluid consistency:: Thin  Pt reports eating oatmeal and toast for breakfast and reports that is what she usually eats for breakfast.   Pt reports usually eating oatmeal for breakfast or sometimes a fried egg sandwich. Pt reports eating grilled cheese for lunch and then dinner of meat with vegetables. Pt reports eating chips or cookies sometimes as a bedtime snack. Pt reports drinking Ensure. Pt reports trying to eat foods such as yogurt, cottage cheese, and other high calorie snacks, however was not included at first in dietary recall. Pt reports eating small amounts, not all of her meals at a time, because often she gets nauseated.   Medications: Solumederol Labs: K 3.4, glucose 172   Gastrointestinal Profile: Last BM:  05/31/2016   Nutrition-Focused Physical Exam Findings: Nutrition-Focused physical exam completed. Findings are severe fat depletion, severe muscle depletion, and  no edema.     Weight Change: Pt reports stable weight. Last weight per [facility] in December 2016 of 70lbs.   Skin:  Reviewed, no issues   Height:   Ht Readings from Last 1 Encounters:  06/01/16 5\' 2"  (1.575 m)    Weight:   Wt Readings from Last 1 Encounters:  06/01/16 78 lb 6 oz (35.551 kg)   Wt Readings from Last 10 Encounters:  06/01/16 78 lb 6 oz (35.551 kg)  11/24/15 70 lb 6 oz (31.922 kg)  10/05/15 68 lb 14.4 oz (31.253 kg)  09/22/15 71 lb 14.4 oz (32.614 kg)  07/31/15 75 lb (34.02 kg)    Ideal Body Weight:   50kg  BMI:  Body mass index is 14.33 kg/(m^2).  Estimated Nutritional Needs:   Kcal:  1500-1750kcals  Protein:  60-75g protein  Fluid:  >/= 1.5L fluid  EDUCATION NEEDS:   No education needs identified at this time   Leda QuailAllyson Manasvini Whatley, RD, LDN Pager [phone] Weekend/On-Call Pager [phone]

## 2016-06-02 MED ORDER — PREDNISONE 10 MG (21) PO TBPK: 10.0000 mg | ORAL_TABLET | Freq: Every day | ORAL | Status: DC

## 2016-06-02 MED ORDER — ALBUTEROL SULFATE HFA 108 (90 BASE) MCG/ACT IN AERS: 2.0000 | INHALATION_SPRAY | Freq: Four times a day (QID) | RESPIRATORY_TRACT | Status: DC | PRN

## 2016-06-02 MED ORDER — TIOTROPIUM BROMIDE MONOHYDRATE 18 MCG IN CAPS: 1.0000 | ORAL_CAPSULE | Freq: Every day | RESPIRATORY_TRACT | Status: DC

## 2016-06-02 MED ORDER — ENSURE ENLIVE PO LIQD: 1.0000 | Freq: Three times a day (TID) | ORAL | Status: AC

## 2016-06-02 MED ORDER — NICOTINE 14 MG/24HR TD PT24: 14.0000 mg | MEDICATED_PATCH | Freq: Every day | TRANSDERMAL | Status: DC

## 2016-06-02 NOTE — Discharge Instructions (Signed)
Activity as tolerated Low-salt diet with feeding supplements Follow-up with primary care physician in a week Continue nicotine patch

## 2016-06-02 NOTE — Progress Notes (Signed)
Received MD order to discharge patient to home, reviewed home meds, discharge instructions and prescriptions with patient and patient verbalized understanding discharged in wheelchair to home with nursing in wheelchair

## 2016-06-02 NOTE — Discharge Summary (Signed)
[facility] [place] [facility]   PATIENT NAME: Ashley RainSusan [name]    MR#:  [id number]  DATE OF BIRTH:  25-Sep-1956  DATE OF ADMISSION:  05/31/2016 ADMITTING PHYSICIAN: Gery Prayebby Crosley, MD  DATE OF DISCHARGE: 06/02/16  PRIMARY CARE PHYSICIAN: Suan HalterAMPBELL,MARGARET F, MD    ADMISSION DIAGNOSIS:  COPD exacerbation (HCC) [J44.1]  DISCHARGE DIAGNOSIS:  Active Problems:   Protein-calorie malnutrition, severe   COPD (chronic obstructive pulmonary disease) (HCC)   HTN (hypertension)   SECONDARY DIAGNOSIS:   Past Medical History  Diagnosis Date  . COPD (chronic obstructive pulmonary disease) (HCC)   . Hypertension   . Anxiety     HOSPITAL COURSE:   . acute exacerbation of COPD (chronic obstructive pulmonary disease) (HCC) -Clinically better. Ambulatory pulse ox is 92% on room air -Wean off Solu-Medrol, duonebs, oxygen started -No antibiotics ordered, currently -d-dimer normal   . Protein-calorie malnutrition, severe -Stable, and Continue feeding supplements Further workup and management with primary care physician  Hypertension -Stable, not on home medications. Provide low-salt diet  Anxiety -Stable, not on anxiolytics at home  Tobacco abuse  -Nicotine patch ordered, counseled patient to quit smoking for her to 5 minutes      DISCHARGE CONDITIONS:   fair  CONSULTS OBTAINED:      PROCEDURES none  DRUG ALLERGIES:   Allergies  Allergen Reactions  . Codeine Nausea Only  . Promethazine Nausea And Vomiting    DISCHARGE MEDICATIONS:   Current Discharge Medication List    START taking these medications   Details  feeding supplement, ENSURE ENLIVE, (ENSURE ENLIVE) LIQD Take 237 mLs by mouth 3 (three) times daily between meals. Qty: 90 Bottle, Refills: 1    nicotine (NICODERM CQ - DOSED IN MG/24 HOURS) 14 mg/24hr patch Place 1 patch (14 mg total) onto the skin daily. Qty: 28 patch, Refills: 0    predniSONE (STERAPRED UNI-PAK 21 TAB) 10  MG (21) TBPK tablet Take 1 tablet (10 mg total) by mouth daily. Take 6 tablets by mouth for 1 day followed by  5 tablets by mouth for 1 day followed by  4 tablets by mouth for 1 day followed by  3 tablets by mouth for 1 day followed by  2 tablets by mouth for 1 day followed by  1 tablet by mouth for a day and stop Qty: 21 tablet, Refills: 0      CONTINUE these medications which have CHANGED   Details  albuterol (PROAIR HFA) 108 (90 Base) MCG/ACT inhaler Inhale 2 puffs into the lungs every 6 (six) hours as needed for wheezing or shortness of breath. Qty: 1 Inhaler, Refills: 1    tiotropium (SPIRIVA) 18 MCG inhalation capsule Place 1 capsule (18 mcg total) into inhaler and inhale daily. Qty: 30 capsule, Refills: 1      CONTINUE these medications which have NOT CHANGED   Details  Fluticasone-Salmeterol (ADVAIR DISKUS) 500-50 MCG/DOSE AEPB Inhale 1 puff into the lungs 2 (two) times daily.     ipratropium-albuterol (DUONEB) 0.5-2.5 (3) MG/3ML SOLN Inhale 3 mLs into the lungs 4 (four) times daily as needed (for shortness of breath and/or wheezing).          DISCHARGE INSTRUCTIONS:   Activity as tolerated Low-salt diet with feeding supplements Follow-up with primary care physician in a week Continue nicotine patch  DIET:  Low-salt diet with feeding supplements  DISCHARGE CONDITION:  Fair  ACTIVITY:  Activity as tolerated  OXYGEN:  Home Oxygen: No.   Oxygen Delivery: room  air  DISCHARGE LOCATION:  home   If you experience worsening of your admission symptoms, develop shortness of breath, life threatening emergency, suicidal or homicidal thoughts you must seek medical attention immediately by calling 911 or calling your MD immediately  if symptoms less severe.  You Must read complete instructions/literature along with all the possible adverse reactions/side effects for all the Medicines you take and that have been prescribed to you. Take any new Medicines after you have  completely understood and accpet all the possible adverse reactions/side effects.   Please note  You were cared for by a hospitalist during your hospital stay. If you have any questions about your discharge medications or the care you received while you were in the hospital after you are discharged, you can call the unit and asked to speak with the hospitalist on call if the hospitalist that took care of you is not available. Once you are discharged, your primary care physician will handle any further medical issues. Please note that NO REFILLS for any discharge medications will be authorized once you are discharged, as it is imperative that you return to your primary care physician (or establish a relationship with a primary care physician if you do not have one) for your aftercare needs so that they can reassess your need for medications and monitor your lab values.     Today  Chief Complaint  Patient presents with  . Shortness of Breath   Patient is resting comfortably. Denies any shortness of breath or chest tightness. Denies any cough. Ambulatory pulse ox is 92% on room air and wants to go home. Requesting inhaler refills as she ran out of them  ROS:  CONSTITUTIONAL: Denies fevers, chills. Denies any fatigue, weakness.  EYES: Denies blurry vision, double vision, eye pain. EARS, NOSE, THROAT: Denies tinnitus, ear pain, hearing loss. RESPIRATORY: Denies cough, wheeze, shortness of breath.  CARDIOVASCULAR: Denies chest pain, palpitations, edema.  GASTROINTESTINAL: Denies nausea, vomiting, diarrhea, abdominal pain. Denies bright red blood per rectum. GENITOURINARY: Denies dysuria, hematuria. ENDOCRINE: Denies nocturia or thyroid problems. HEMATOLOGIC AND LYMPHATIC: Denies easy bruising or bleeding. SKIN: Denies rash or lesion. MUSCULOSKELETAL: Denies pain in neck, back, shoulder, knees, hips or arthritic symptoms.  NEUROLOGIC: Denies paralysis, paresthesias.  PSYCHIATRIC: Denies anxiety  or depressive symptoms.   VITAL SIGNS:  Blood pressure 106/49, pulse 64, temperature 97.9 F (36.6 C), temperature source Oral, resp. rate 14, height  (1.575 m), weight 35.551 kg (78 lb 6 oz), SpO2 94 %.  I/O:    Intake/Output Summary (Last 24 hours) at 06/02/16 1058 Last data filed at 06/02/16 0800  Gross per 24 hour  Intake    120 ml  Output      0 ml  Net    120 ml    PHYSICAL EXAMINATION:  GENERAL:  60 y.o.-year-old patient lying in the bed with no acute distress.  EYES: Pupils equal, round, reactive to light and accommodation. No scleral icterus. Extraocular muscles intact.  HEENT: Head atraumatic, normocephalic. Oropharynx and nasopharynx clear.  NECK:  Supple, no jugular venous distention. No thyroid enlargement, no tenderness.  LUNGS: Normal breath sounds bilaterally, no wheezing, rales,rhonchi or crepitation. No use of accessory muscles of respiration.  CARDIOVASCULAR: S1, S2 normal. No murmurs, rubs, or gallops.  ABDOMEN: Soft, non-tender, non-distended. Bowel sounds present. No organomegaly or mass.  EXTREMITIES: No pedal edema, cyanosis, or clubbing.  NEUROLOGIC: Cranial nerves II through XII are intact. Muscle strength 5/5 in all extremities. Sensation intact. Gait not checked.  PSYCHIATRIC: The patient is alert and oriented x 3.  SKIN: No obvious rash, lesion, or ulcer.   DATA REVIEW:   CBC  Recent Labs Lab 06/01/16 0422  WBC 5.5  HGB 13.6  HCT 40.1  PLT 210    Chemistries   Recent Labs Lab 06/01/16 0422  NA 141  K 3.4*  CL 105  CO2 26  GLUCOSE 172*  BUN 11  CREATININE 0.52  CALCIUM 9.4    Cardiac Enzymes  Recent Labs Lab 05/31/16 2010  TROPONINI <0.03    Microbiology Results  Results for orders placed or performed during the hospital encounter of 09/30/15  Culture, blood (routine x 2)     Status: None   Collection Time: 09/30/15  2:20 PM  Result Value Ref Range Status   Specimen Description BLOOD LEFT WRIST  Final   Special  Requests BOTTLES DRAWN AEROBIC AND ANAEROBIC  1CC  Final   Culture NO GROWTH 5 DAYS  Final   Report Status 10/05/2015 FINAL  Final  Culture, blood (routine x 2)     Status: None   Collection Time: 09/30/15  2:31 PM  Result Value Ref Range Status   Specimen Description BLOOD LEFT UPPERARM  Final   Special Requests BOTTLES DRAWN AEROBIC AND ANAEROBIC  1CC  Final   Culture NO GROWTH 5 DAYS  Final   Report Status 10/05/2015 FINAL  Final  C difficile quick scan w PCR reflex     Status: None   Collection Time: 10/02/15  6:03 PM  Result Value Ref Range Status   C Diff antigen NEGATIVE NEGATIVE Final   C Diff toxin NEGATIVE NEGATIVE Final   C Diff interpretation Negative for C. difficile  Final  Stool culture     Status: None   Collection Time: 10/03/15 10:03 AM  Result Value Ref Range Status   Specimen Description STOOL  Final   Special Requests NONE  Final   Culture   Final    LIGHT GROWTH CANDIDA ALBICANS NO CAMPYLOBACTER DETECTED NO SALMONELLA OR SHIGELLA ISOLATED No Pathogenic E. coli detected No normal fecal flora isolated.    Report Status 10/06/2015 FINAL  Final    RADIOLOGY:  Dg Chest 1 View  05/31/2016  CLINICAL DATA:  Shortness of Breath EXAM: CHEST 1 VIEW COMPARISON:  11/24/2015 FINDINGS: The cardiac shadow is within normal limits. The lungs are well aerated bilaterally. Some scarring is noted in the left apex but significantly improved from the prior exam. No new focal infiltrate is seen. No bony abnormality is noted. IMPRESSION: Chronic changes in the left apex.  No acute abnormality noted. Electronically Signed   By: Alcide Clever M.D.   On: 05/31/2016 20:18    EKG:   Orders placed or performed during the hospital encounter of 05/31/16  . EKG 12-Lead  . EKG 12-Lead      Management plans discussed with the patient, She is in agreement  CODE STATUS:     Code Status Orders        Start     Ordered   06/01/16 0201  Full code   Continuous     06/01/16 0200     Code Status History    Date Active Date Inactive Code Status Order ID Comments User Context   09/30/2015  4:04 PM 10/05/2015  3:50 PM Full Code [id number]  Altamese Dilling, MD ED   09/22/2015  5:47 PM 09/24/2015  2:04 PM Full Code [id number]  Enid Baas, MD Inpatient  TOTAL TIME TAKING CARE OF THIS PATIENT: 45 minutes.   Note: This dictation was prepared with Dragon dictation along with smaller phrase technology. Any transcriptional errors that result from this process are unintentional.   @[facility] @  on 06/02/2016 at 10:58 AM  Between 7am to 6pm - Pager - [phone]  After 6pm go to [email or web address] - password [email or web address] [facility]  [place] [facility]  Office  [phone]  CC: Primary care physician; Suan Halter, MD

## 2016-06-02 NOTE — Plan of Care (Signed)
Problem: Activity: Goal: Risk for activity intolerance will decrease Outcome: Progressing Pt ambulated around the nurses station on room air. O2 saturations were at 92 %

## 2016-07-04 ENCOUNTER — Emergency Department
Admission: EM | Admit: 2016-07-04 | Discharge: 2016-07-04 | Disposition: A | Discharge status: Home / Self Care | Payer: Self-pay | Attending: Emergency Medicine | Admitting: Emergency Medicine

## 2016-07-04 ENCOUNTER — Emergency Department: Payer: Self-pay

## 2016-07-04 DIAGNOSIS — J441 Chronic obstructive pulmonary disease with (acute) exacerbation: Secondary | ICD-10-CM | POA: Insufficient documentation

## 2016-07-04 DIAGNOSIS — I10 Essential (primary) hypertension: Secondary | ICD-10-CM | POA: Insufficient documentation

## 2016-07-04 DIAGNOSIS — F17210 Nicotine dependence, cigarettes, uncomplicated: Secondary | ICD-10-CM | POA: Insufficient documentation

## 2016-07-04 DIAGNOSIS — Z79899 Other long term (current) drug therapy: Secondary | ICD-10-CM | POA: Insufficient documentation

## 2016-07-04 MED ORDER — LORAZEPAM 2 MG/ML IJ SOLN
0.5000 mg | Freq: Once | INTRAMUSCULAR | Status: AC
  Administered 2016-07-04: 0.5 mg via INTRAVENOUS
  Filled 2016-07-04: qty 1

## 2016-07-04 MED ORDER — IPRATROPIUM-ALBUTEROL 0.5-2.5 (3) MG/3ML IN SOLN
3.0000 mL | Freq: Once | RESPIRATORY_TRACT | Status: AC
  Administered 2016-07-04: 3 mL via RESPIRATORY_TRACT
  Filled 2016-07-04: qty 3

## 2016-07-04 NOTE — ED Notes (Signed)
Pt from home via EMS, reports diff breathing, EMS gave 2 duonebs and solumedrol. Pt shaking on assessment and states she has anxiety

## 2016-07-04 NOTE — ED Provider Notes (Signed)
[facility] Emergency Department Provider Note   ____________________________________________  Time seen: Approximately 8:19 PM  I have reviewed the triage vital signs and the nursing notes.   HISTORY  Chief Complaint Shortness of Breath    HPI Ashley [name] is a 60 y.o. female who has a history of COPD reports she has exacerbations probably at least monthly. She reports she was having her usual exacerbation get short of breath used her inhaler nebulizer but was not getting better. She called 911 and EMS gave her 2 DuoNeb since I Medrol she is now very shaky and anxious and still feels somewhat short of breath. She is not coughing she has no fever really doesn't have any chest pain.  Past Medical History  Diagnosis Date  . COPD (chronic obstructive pulmonary disease) (HCC)   . Hypertension   . Anxiety     Patient Active Problem List   Diagnosis Date Noted  . COPD (chronic obstructive pulmonary disease) (HCC) 06/01/2016  . HTN (hypertension) 06/01/2016  . Diarrhea 10/05/2015  . Protein-calorie malnutrition, severe 10/02/2015  . Malnutrition (HCC) 10/02/2015  . Pressure ulcer 10/01/2015  . SVT (supraventricular tachycardia) (HCC) 09/30/2015  . Pneumonia 09/30/2015  . Chest pain 09/22/2015    Past Surgical History  Procedure Laterality Date  . Abdominal hysterectomy      Current Outpatient Rx  Name  Route  Sig  Dispense  Refill  . albuterol (PROAIR HFA) 108 (90 Base) MCG/ACT inhaler   Inhalation   Inhale 2 puffs into the lungs every 6 (six) hours as needed for wheezing or shortness of breath.   1 Inhaler   1   . feeding supplement, ENSURE ENLIVE, (ENSURE ENLIVE) LIQD   Oral   Take 237 mLs by mouth 3 (three) times daily between meals.   90 Bottle   1   . Fluticasone-Salmeterol (ADVAIR DISKUS) 500-50 MCG/DOSE AEPB   Inhalation   Inhale 1 puff into the lungs 2 (two) times daily.          Marland Kitchen ipratropium-albuterol (DUONEB) 0.5-2.5 (3)  MG/3ML SOLN   Inhalation   Inhale 3 mLs into the lungs 4 (four) times daily as needed (for shortness of breath and/or wheezing).          . nicotine (NICODERM CQ - DOSED IN MG/24 HOURS) 14 mg/24hr patch   Transdermal   Place 1 patch (14 mg total) onto the skin daily.   28 patch   0   . predniSONE (STERAPRED UNI-PAK 21 TAB) 10 MG (21) TBPK tablet   Oral   Take 1 tablet (10 mg total) by mouth daily. Take 6 tablets by mouth for 1 day followed by  5 tablets by mouth for 1 day followed by  4 tablets by mouth for 1 day followed by  3 tablets by mouth for 1 day followed by  2 tablets by mouth for 1 day followed by  1 tablet by mouth for a day and stop   21 tablet   0   . tiotropium (SPIRIVA) 18 MCG inhalation capsule   Inhalation   Place 1 capsule (18 mcg total) into inhaler and inhale daily.   30 capsule   1     Allergies Codeine and Promethazine  Family History  Problem Relation Age of Onset  . CAD Mother   . CAD Father   . Cancer - Prostate Brother   . Cancer - Lung Brother     Social History Social History  Substance  Use Topics  . Smoking status: Current Every Day Smoker -- 0.50 packs/day    Types: Cigarettes  . Smokeless tobacco: None  . Alcohol Use: No    Review of Systems Constitutional: No fever/chills Eyes: No visual changes. ENT: No sore throat. Cardiovascular: Denies chest pain. Respiratory:shortness of breath. Gastrointestinal: No abdominal pain.  No nausea, no vomiting.  No diarrhea.  No constipation. Genitourinary: Negative for dysuria. Musculoskeletal: Negative for back pain. Skin: Negative for rash. Neurological: Negative for headaches, focal weakness or numbness.  10-point ROS otherwise negative.  ____________________________________________   PHYSICAL EXAM:  VITAL SIGNS: ED Triage Vitals  Enc Vitals Group     BP 07/04/16 1941 144/90 mmHg     Pulse Rate 07/04/16 1941 109     Resp --      Temp 07/04/16 1941 98.3 F (36.8 C)      Temp Source 07/04/16 1941 Oral     SpO2 07/04/16 1941 93 %     Weight --      Height --      Head Cir --      Peak Flow --      Pain Score --      Pain Loc --      Pain Edu? --      Excl. in GC? --     Constitutional: Alert and oriented. Well appearing and in no acute distress. Eyes: Conjunctivae are normal. PERRL. EOMI. Head: Atraumatic. Nose: No congestion/rhinnorhea. Mouth/Throat: Mucous membranes are moist.  Oropharynx non-erythematous. Neck: No stridor.  Cardiovascular: Normal rate, regular rhythm. Grossly normal heart sounds.  Good peripheral circulation. Respiratory: Normal respiratory effort.  No retractions. Lungs CTAB, sl decr breath sounds. Improve w/ nebs Gastrointestinal: Soft and nontender. No distention. No abdominal bruits. No CVA tenderness. Musculoskeletal: No lower extremity tenderness nor edema.  No joint effusions. Neurologic:  Normal speech and language. No gross focal neurologic deficits are appreciated. No gait instability. Skin:  Skin is warm, dry and intact. No rash noted. Psychiatric: Mood and affect are normal. Speech and behavior are normal.  ____________________________________________   LABS (all labs ordered are listed, but only abnormal results are displayed)  Labs Reviewed - No data to display ____________________________________________  EKG  EKG read and interpreted by me shows normal sinus rhythm rate of 94 normal axis possible right atrial enlargement. EKG looks essentially the same as one from 05/31/2016. ____________________________________________  RADIOLOGY  Radiology reads chest x-ray as possible scarring in the upper lobe. I cannot rule out the possibility of pneumonia. Patient does not have a productive cough she is not coughing at all. She is not running a fever and has a normal white count I do not believe she has  pneumonia. ____________________________________________   PROCEDURES    Procedures    ____________________________________________   INITIAL IMPRESSION / ASSESSMENT AND PLAN / ED COURSE  Pertinent labs & imaging results that were available during my care of the patient were reviewed by me and considered in my medical decision making (see chart for details).  Patient feels much better O2 sats are 92-93 when she is lying sitting still to get dropped down to 91. The go up immediately when she speaks or moves. Patient feels comfortable to go home I will discharge her ____________________________________________   FINAL CLINICAL IMPRESSION(S) / ED DIAGNOSES  Final diagnoses:  COPD exacerbation (HCC)      NEW MEDICATIONS STARTED DURING THIS VISIT:  Discharge Medication List as of 07/04/2016  9:56 PM  Note:  This document was prepared using Dragon voice recognition software and may include unintentional dictation errors.    Arnaldo NatalPaul F Malinda, MD 07/04/16 2238

## 2016-07-04 NOTE — ED Notes (Signed)
Pt states she has a hx of COPD and has been using Albuterol Inhaler today more than usual and taking breathing treatments without results - She states she is having difficulty breathing

## 2016-07-07 ENCOUNTER — Emergency Department: Payer: MEDICAID

## 2016-07-07 ENCOUNTER — Encounter: Payer: Self-pay | Admitting: Emergency Medicine

## 2016-07-07 ENCOUNTER — Emergency Department
Admission: EM | Admit: 2016-07-07 | Discharge: 2016-07-07 | Disposition: A | Discharge status: Home / Self Care | Payer: MEDICAID | Attending: Emergency Medicine | Admitting: Emergency Medicine

## 2016-07-07 DIAGNOSIS — R0600 Dyspnea, unspecified: Secondary | ICD-10-CM

## 2016-07-07 DIAGNOSIS — J449 Chronic obstructive pulmonary disease, unspecified: Secondary | ICD-10-CM

## 2016-07-07 DIAGNOSIS — R531 Weakness: Secondary | ICD-10-CM

## 2016-07-07 DIAGNOSIS — I10 Essential (primary) hypertension: Secondary | ICD-10-CM | POA: Insufficient documentation

## 2016-07-07 DIAGNOSIS — F17210 Nicotine dependence, cigarettes, uncomplicated: Secondary | ICD-10-CM | POA: Insufficient documentation

## 2016-07-07 LAB — CBC WITH DIFFERENTIAL/PLATELET
Basophils Absolute: 0 10*3/uL (ref 0–0.1)
Basophils Relative: 0 %
Eosinophils Absolute: 0 10*3/uL (ref 0–0.7)
Eosinophils Relative: 0 %
HCT: 46.7 % (ref 35.0–47.0)
Hemoglobin: 16.2 g/dL — ABNORMAL HIGH (ref 12.0–16.0)
Lymphocytes Relative: 19 %
Lymphs Abs: 1.3 10*3/uL (ref 1.0–3.6)
MCH: 30.8 pg (ref 26.0–34.0)
MCHC: 34.6 g/dL (ref 32.0–36.0)
MCV: 88.9 fL (ref 80.0–100.0)
Monocytes Absolute: 0.4 10*3/uL (ref 0.2–0.9)
Monocytes Relative: 6 %
Neutro Abs: 5.1 10*3/uL (ref 1.4–6.5)
Neutrophils Relative %: 75 %
Platelets: 279 10*3/uL (ref 150–440)
RBC: 5.26 MIL/uL — ABNORMAL HIGH (ref 3.80–5.20)
RDW: 13.3 % (ref 11.5–14.5)
WBC: 6.8 10*3/uL (ref 3.6–11.0)

## 2016-07-07 LAB — BASIC METABOLIC PANEL
Anion gap: 8 (ref 5–15)
BUN: 9 mg/dL (ref 6–20)
CO2: 30 mmol/L (ref 22–32)
Calcium: 9.6 mg/dL (ref 8.9–10.3)
Chloride: 103 mmol/L (ref 101–111)
Creatinine, Ser: 0.52 mg/dL (ref 0.44–1.00)
GFR calc Af Amer: >60 mL/min (ref >60)
GFR calc non Af Amer: >60 mL/min (ref >60)
Glucose, Bld: 146 mg/dL — ABNORMAL HIGH (ref 65–99)
Potassium: 3.3 mmol/L — ABNORMAL LOW (ref 3.5–5.1)
Sodium: 141 mmol/L (ref 135–145)

## 2016-07-07 LAB — URINALYSIS COMPLETE WITH MICROSCOPIC (ARMC ONLY)
Bacteria, UA: NONE SEEN
Bilirubin Urine: NEGATIVE
Glucose, UA: NEGATIVE mg/dL
Hgb urine dipstick: NEGATIVE
KETONES UR: NEGATIVE mg/dL
LEUKOCYTES UA: NEGATIVE
Nitrite: NEGATIVE
PH: 7 (ref 5.0–8.0)
PROTEIN: NEGATIVE mg/dL
SPECIFIC GRAVITY, URINE: 1.002 — AB (ref 1.005–1.030)
SQUAMOUS EPITHELIAL / LPF: NONE SEEN

## 2016-07-07 LAB — TROPONIN I: Troponin I: <0.03 ng/mL (ref <0.03)

## 2016-07-07 MED ORDER — LORAZEPAM 1 MG PO TABS: 1.0000 mg | ORAL_TABLET | Freq: Three times a day (TID) | ORAL | Status: AC | PRN

## 2016-07-07 MED ORDER — LORAZEPAM 2 MG/ML IJ SOLN
0.5000 mg | Freq: Once | INTRAMUSCULAR | Status: AC
  Administered 2016-07-07: 0.5 mg via INTRAVENOUS
  Filled 2016-07-07: qty 1

## 2016-07-07 MED ORDER — PREDNISONE 20 MG PO TABS: 40.0000 mg | ORAL_TABLET | Freq: Every day | ORAL | Status: DC

## 2016-07-07 MED ORDER — IPRATROPIUM-ALBUTEROL 0.5-2.5 (3) MG/3ML IN SOLN
3.0000 mL | Freq: Once | RESPIRATORY_TRACT | Status: AC
  Administered 2016-07-07: 3 mL via RESPIRATORY_TRACT
  Filled 2016-07-07: qty 3

## 2016-07-07 MED ORDER — SODIUM CHLORIDE 0.9 % IV BOLUS (SEPSIS)
1000.0000 mL | Freq: Once | INTRAVENOUS | Status: AC
  Administered 2016-07-07: 1000 mL via INTRAVENOUS

## 2016-07-07 NOTE — ED Notes (Signed)
Patient c/o shortness of breath since about 2 am. Patient denies cough. Patient states that she feels like her "insides are jerking really bad." Patient states that she has been using her albuterol inhaler without relief.

## 2016-07-07 NOTE — ED Notes (Signed)
This RN explained delay to patient, patient states understanding at this time. NAD noted. Pt's husband at bedside at this time. NAD noted. VSS at this time.

## 2016-07-07 NOTE — ED Provider Notes (Signed)
[facility] Emergency Department Provider Note  Time seen: 10:19 AM  I have reviewed the triage vital signs and the nursing notes.   HISTORY  Chief Complaint Shortness of Breath    HPI Ashley BaasSusan E [name] is a 60 y.o. female with a past medical history of COPD, hypertension, anxiety who presents the emergency department with shortness breath. According to the patient for the past several days she feels like she cannot get a full breath of air. She also states generalized weakness, and she feels her muscles "jumping." Patient has been using her nebulizer without relief so she came to the emergency department for evaluation. Denies any chest pain, leg pain or swelling. Denies any fever or cough or congestion. Patient does admit to daily smoking.     Past Medical History  Diagnosis Date  . COPD (chronic obstructive pulmonary disease) (HCC)   . Hypertension   . Anxiety     Patient Active Problem List   Diagnosis Date Noted  . COPD (chronic obstructive pulmonary disease) (HCC) 06/01/2016  . HTN (hypertension) 06/01/2016  . Diarrhea 10/05/2015  . Protein-calorie malnutrition, severe 10/02/2015  . Malnutrition (HCC) 10/02/2015  . Pressure ulcer 10/01/2015  . SVT (supraventricular tachycardia) (HCC) 09/30/2015  . Pneumonia 09/30/2015  . Chest pain 09/22/2015    Past Surgical History  Procedure Laterality Date  . Abdominal hysterectomy      Current Outpatient Rx  Name  Route  Sig  Dispense  Refill  . albuterol (PROAIR HFA) 108 (90 Base) MCG/ACT inhaler   Inhalation   Inhale 2 puffs into the lungs every 6 (six) hours as needed for wheezing or shortness of breath.   1 Inhaler   1   . feeding supplement, ENSURE ENLIVE, (ENSURE ENLIVE) LIQD   Oral   Take 237 mLs by mouth 3 (three) times daily between meals.   90 Bottle   1   . Fluticasone-Salmeterol (ADVAIR DISKUS) 500-50 MCG/DOSE AEPB   Inhalation   Inhale 1 puff into the lungs 2 (two) times daily.         Marland Kitchen. ipratropium-albuterol (DUONEB) 0.5-2.5 (3) MG/3ML SOLN   Inhalation   Inhale 3 mLs into the lungs 4 (four) times daily as needed (for shortness of breath and/or wheezing).          . nicotine (NICODERM CQ - DOSED IN MG/24 HOURS) 14 mg/24hr patch   Transdermal   Place 1 patch (14 mg total) onto the skin daily.   28 patch   0   . predniSONE (STERAPRED UNI-PAK 21 TAB) 10 MG (21) TBPK tablet   Oral   Take 1 tablet (10 mg total) by mouth daily. Take 6 tablets by mouth for 1 day followed by  5 tablets by mouth for 1 day followed by  4 tablets by mouth for 1 day followed by  3 tablets by mouth for 1 day followed by  2 tablets by mouth for 1 day followed by  1 tablet by mouth for a day and stop   21 tablet   0   . tiotropium (SPIRIVA) 18 MCG inhalation capsule   Inhalation   Place 1 capsule (18 mcg total) into inhaler and inhale daily.   30 capsule   1     Allergies Codeine and Promethazine  Family History  Problem Relation Age of Onset  . CAD Mother   . CAD Father   . Cancer - Prostate Brother   . Cancer - Lung Brother  Social History Social History  Substance Use Topics  . Smoking status: Current Every Day Smoker -- 0.50 packs/day    Types: Cigarettes  . Smokeless tobacco: None  . Alcohol Use: No    Review of Systems Constitutional: Negative for fever. Cardiovascular: Negative for chest pain. Respiratory: Negative for shortness of breath. Gastrointestinal: Negative for abdominal pain Genitourinary: Negative for dysuria.  Musculoskeletal: Negative for back pain Neurological: Negative for headache 10-point ROS otherwise negative.  ____________________________________________   PHYSICAL EXAM:  VITAL SIGNS: ED Triage Vitals  Enc Vitals Group     BP 07/07/16 0953 180/102 mmHg     Pulse Rate 07/07/16 0953 106     Resp 07/07/16 0953 26     Temp 07/07/16 0953 98 F (36.7 C)     Temp Source 07/07/16 0953 Oral     SpO2 07/07/16 0953 97 %      Weight 07/07/16 0953 75 lb (34.02 kg)     Height 07/07/16 0953  (1.575 m)     Head Cir --      Peak Flow --      Pain Score --      Pain Loc --      Pain Edu? --      Excl. in GC? --     Constitutional: Alert and oriented. Well appearing and in no distress. Eyes: Normal exam ENT   Head: Normocephalic and atraumatic   Mouth/Throat: Mucous membranes are moist. Cardiovascular: Normal rate, regular rhythm. No murmur Respiratory: Normal respiratory effort without tachypnea nor retractions. Breath sounds are clear. No wheezes rales or rhonchi. Good air movement bilaterally. Gastrointestinal: Soft and nontender. No distention.   Musculoskeletal: Nontender with normal range of motion in all extremities.  Neurologic:  Normal speech and language. No gross focal neurologic deficits  Skin:  Skin is warm, dry and intact.  Psychiatric: Mood and affect are normal. ____________________________________________    EKG  EKG reviewed and interpreted by myself shows normal sinus rhythm at 94 bpm, narrow QRS, normal axis, normal intervals, nonspecific ST changes but no ST elevations.  ____________________________________________    RADIOLOGY  Chest x-ray negative for acute issue.  ____________________________________________    INITIAL IMPRESSION / ASSESSMENT AND PLAN / ED COURSE  Pertinent labs & imaging results that were available during my care of the patient were reviewed by me and considered in my medical decision making (see chart for details).  The patient presents the emergency department shortness of breath and generalized fatigue and weakness. We will check labs, chest x-ray, dose a DuoNeb as well as half a milligram of Ativan IV.  Chest x-ray negative. Vitals remained within normal limits, 94-97 percent on room air. Labs are normal including negative troponin. Patient states she feels much better after breathing treatment and Ativan. We will discharge the patient on  short course of prednisone as well as a short course of Ativan. Patient will follow up with her primary care doctor this week. Patient agreeable to plan.  ____________________________________________   FINAL CLINICAL IMPRESSION(S) / ED DIAGNOSES  Dyspnea Generalized weakness   Minna Antis, MD 07/07/16 1305

## 2016-07-07 NOTE — ED Notes (Signed)
Pt to ed with c/o sob and difficulty breathing, pt was seen here wed pm and was given breathing treatments.  Pt reports she feels anxious and has been shaking for the last several days.

## 2016-07-07 NOTE — ED Notes (Signed)
NAD noted at time of D/C. Pt denies questions or concerns. Pt taken to the lobby via wheelchair at this time by her husband.

## 2016-07-07 NOTE — ED Notes (Signed)
NAD noted at this time. Pt resting in bed with eyes closed. Respirations even and unlabored. VSS. Will continue to monitor for further patient needs at this time. Pt's husband arrived to bedside at this time.

## 2016-07-07 NOTE — ED Notes (Signed)
This RN introduced self to patient, assisted patient to the bathroom. Pt tolerated well. UA obtained at this time. Pt denies pain or further needs at this time. Will continue to monitor at this time. Call bell within reach and patient instructed to use as needed.

## 2016-07-07 NOTE — Discharge Instructions (Signed)
Chronic Obstructive Pulmonary Disease Exacerbation Chronic obstructive pulmonary disease (COPD) is a common lung problem. In COPD, the flow of air from the lungs is limited. COPD exacerbations are times that breathing gets worse and you need extra treatment. Without treatment they can be life threatening. If they happen often, your lungs can become more damaged. If your COPD gets worse, your doctor may treat you with:  Medicines.  Oxygen.  Different ways to clear your airway, such as using a mask. HOME CARE  Do not smoke.  Avoid tobacco smoke and other things that bother your lungs.  If given, take your antibiotic medicine as told. Finish the medicine even if you start to feel better.  Only take medicines as told by your doctor.  Drink enough fluids to keep your pee (urine) clear or pale yellow (unless your doctor has told you not to).  Use a cool mist machine (vaporizer).  If you use oxygen or a machine that turns liquid medicine into a mist (nebulizer), continue to use them as told.  Keep up with shots (vaccinations) as told by your doctor.  Exercise regularly.  Eat healthy foods.  Keep all doctor visits as told. GET HELP RIGHT AWAY IF:  You are very short of breath and it gets worse.  You have trouble talking.  You have bad chest pain.  You have blood in your spit (sputum).  You have a fever.  You keep throwing up (vomiting).  You feel weak, or you pass out (faint).  You feel confused.  You keep getting worse. MAKE SURE YOU:  Understand these instructions.  Will watch your condition.  Will get help right away if you are not doing well or get worse.   This information is not intended to replace advice given to you by your health care provider. Make sure you discuss any questions you have with your health care provider.   Document Released: 11/29/2011 Document Revised: 12/31/2014 Document Reviewed: 08/14/2013 Elsevier Interactive Patient Education 2016  [employer]  Weakness Weakness is a lack of strength. It may be felt all over the body (generalized) or in one specific part of the body (focal). Some causes of weakness can be serious. You may need further medical evaluation, especially if you are elderly or you have a history of immunosuppression (such as chemotherapy or HIV), kidney disease, heart disease, or diabetes. CAUSES  Weakness can be caused by many different things, including:  Infection.  Physical exhaustion.  Internal bleeding or other blood loss that results in a lack of red blood cells (anemia).  Dehydration. This cause is more common in elderly people.  Side effects or electrolyte abnormalities from medicines, such as pain medicines or sedatives.  Emotional distress, anxiety, or depression.  Circulation problems, especially severe peripheral arterial disease.  Heart disease, such as rapid atrial fibrillation, bradycardia, or heart failure.  Nervous system disorders, such as Guillain-Barr syndrome, multiple sclerosis, or stroke. DIAGNOSIS  To find the cause of your weakness, your caregiver will take your history and perform a physical exam. Lab tests or X-rays may also be ordered, if needed. TREATMENT  Treatment of weakness depends on the cause of your symptoms and can vary greatly. HOME CARE INSTRUCTIONS   Rest as needed.  Eat a well-balanced diet.  Try to get some exercise every day.  Only take over-the-counter or prescription medicines as directed by your caregiver. SEEK MEDICAL CARE IF:   Your weakness seems to be getting worse or spreads to other parts of your  body.  You develop new aches or pains. SEEK IMMEDIATE MEDICAL CARE IF:   You cannot perform your normal daily activities, such as getting dressed and feeding yourself.  You cannot walk up and down stairs, or you feel exhausted when you do so.  You have shortness of breath or chest pain.  You have difficulty moving parts of your  body.  You have weakness in only one area of the body or on only one side of the body.  You have a fever.  You have trouble speaking or swallowing.  You cannot control your bladder or bowel movements.  You have black or bloody vomit or stools. MAKE SURE YOU:  Understand these instructions.  Will watch your condition.  Will get help right away if you are not doing well or get worse.   This information is not intended to replace advice given to you by your health care provider. Make sure you discuss any questions you have with your health care provider.   Document Released: 12/10/2005 Document Revised: 06/10/2012 Document Reviewed: 02/08/2012 Elsevier Interactive Patient Education [employer]

## 2016-07-17 ENCOUNTER — Emergency Department: Payer: Self-pay

## 2016-07-17 ENCOUNTER — Emergency Department
Admission: EM | Admit: 2016-07-17 | Discharge: 2016-07-19 | Disposition: A | Discharge status: Home / Self Care | Payer: Self-pay | Attending: Emergency Medicine | Admitting: Emergency Medicine

## 2016-07-17 ENCOUNTER — Encounter: Payer: Self-pay | Admitting: *Deleted

## 2016-07-17 DIAGNOSIS — F17210 Nicotine dependence, cigarettes, uncomplicated: Secondary | ICD-10-CM | POA: Insufficient documentation

## 2016-07-17 DIAGNOSIS — Z7951 Long term (current) use of inhaled steroids: Secondary | ICD-10-CM | POA: Insufficient documentation

## 2016-07-17 DIAGNOSIS — R079 Chest pain, unspecified: Secondary | ICD-10-CM | POA: Insufficient documentation

## 2016-07-17 DIAGNOSIS — I10 Essential (primary) hypertension: Secondary | ICD-10-CM | POA: Insufficient documentation

## 2016-07-17 DIAGNOSIS — J449 Chronic obstructive pulmonary disease, unspecified: Secondary | ICD-10-CM | POA: Insufficient documentation

## 2016-07-17 LAB — CBC
HEMATOCRIT: 43.9 % (ref 35.0–47.0)
HEMOGLOBIN: 15.1 g/dL (ref 12.0–16.0)
MCH: 30.9 pg (ref 26.0–34.0)
MCHC: 34.4 g/dL (ref 32.0–36.0)
MCV: 89.8 fL (ref 80.0–100.0)
Platelets: 192 10*3/uL (ref 150–440)
RBC: 4.89 MIL/uL (ref 3.80–5.20)
RDW: 13.9 % (ref 11.5–14.5)
WBC: 7.7 10*3/uL (ref 3.6–11.0)

## 2016-07-17 LAB — BASIC METABOLIC PANEL
ANION GAP: 9 (ref 5–15)
BUN: 9 mg/dL (ref 6–20)
CO2: 27 mmol/L (ref 22–32)
Calcium: 9.2 mg/dL (ref 8.9–10.3)
Chloride: 106 mmol/L (ref 101–111)
Creatinine, Ser: 0.47 mg/dL (ref 0.44–1.00)
GFR calc Af Amer: >60 mL/min (ref >60)
GFR calc non Af Amer: >60 mL/min (ref >60)
GLUCOSE: 120 mg/dL — AB (ref 65–99)
POTASSIUM: 3.8 mmol/L (ref 3.5–5.1)
Sodium: 142 mmol/L (ref 135–145)

## 2016-07-17 LAB — TROPONIN I
Troponin I: <0.03 ng/mL (ref <0.03)
Troponin I: <0.03 ng/mL (ref <0.03)

## 2016-07-17 MED ORDER — LORAZEPAM 1 MG PO TABS
1.0000 mg | ORAL_TABLET | ORAL | Status: AC
  Administered 2016-07-17: 1 mg via ORAL

## 2016-07-17 MED ORDER — ALBUTEROL SULFATE (2.5 MG/3ML) 0.083% IN NEBU: 5.0000 mg | INHALATION_SOLUTION | Freq: Once | RESPIRATORY_TRACT | Status: DC

## 2016-07-17 MED ORDER — LORAZEPAM 1 MG PO TABS
ORAL_TABLET | ORAL | Status: AC
  Administered 2016-07-17: 1 mg via ORAL
  Filled 2016-07-17: qty 1

## 2016-07-17 MED ORDER — ASPIRIN 81 MG PO CHEW
324.0000 mg | CHEWABLE_TABLET | Freq: Once | ORAL | Status: AC
  Administered 2016-07-17: 324 mg via ORAL
  Filled 2016-07-17: qty 4

## 2016-07-17 NOTE — Discharge Instructions (Signed)

## 2016-07-17 NOTE — ED Provider Notes (Signed)
[facility] Emergency Department Provider Note  ____________________________________________  Time seen: Approximately 7:14 PM  I have reviewed the triage vital signs and the nursing notes.   HISTORY  Chief Complaint Shortness of Breath    HPI Ashley [name] is a 60 y.o. female with a history of COPD, HTN, anxiety, and frequent abscesses and shortness of breath.  She presents by EMS for evaluation of new onset chest pain today.  She reports that started acutely while she was at rest watching TV.  She described it as a very brief sharp stabbing sensation in the left side of her chest.  It lasted for a couple of minutes and then resolved.  She denies any shortness of breath greater than baseline and she has been using her medications as per usual.  She has had several episodes of the chest pain as described above.  Nothing makes it better and nothing makes it worse.  She has severe anxiety which often worsens or shortness of breath and she was concerned that she was having a problem with her heart and she thinks that her anxiety mattered made the pain worse.  She is currently pain-free.  She continues to smoke.  She denies fever/chills, abdominal pain, nausea, vomiting, dysuria.   Past Medical History:  Diagnosis Date  . Anxiety   . COPD (chronic obstructive pulmonary disease) (HCC)   . Hypertension     Patient Active Problem List   Diagnosis Date Noted  . COPD (chronic obstructive pulmonary disease) (HCC) 06/01/2016  . HTN (hypertension) 06/01/2016  . Diarrhea 10/05/2015  . Protein-calorie malnutrition, severe 10/02/2015  . Malnutrition (HCC) 10/02/2015  . Pressure ulcer 10/01/2015  . SVT (supraventricular tachycardia) (HCC) 09/30/2015  . Pneumonia 09/30/2015  . Chest pain 09/22/2015    Past Surgical History:  Procedure Laterality Date  . ABDOMINAL HYSTERECTOMY      Current Outpatient Rx  . Order #: [id number] Class: Print  . Order #:  [id number] Class: Print  . Order #: [id number] Class: Historical Med  . Order #: [id number] Class: Historical Med  . Order #: [id number] Class: Print  . Order #: [id number] Class: Print  . Order #: [id number] Class: Print  . Order #: [id number] Class: Print    Allergies Codeine and Promethazine  Family History  Problem Relation Age of Onset  . CAD Mother   . CAD Father   . Cancer - Prostate Brother   . Cancer - Lung Brother     Social History Social History  Substance Use Topics  . Smoking status: Current Every Day Smoker    Packs/day: 0.50    Types: Cigarettes  . Smokeless tobacco: Never Used  . Alcohol use No    Review of Systems Constitutional: No fever/chills Eyes: No visual changes. ENT: No sore throat. Cardiovascular: +chest pain. Respiratory: Chronic shortness of breath. Gastrointestinal: No abdominal pain.  No nausea, no vomiting.  No diarrhea.  No constipation. Genitourinary: Negative for dysuria. Musculoskeletal: Negative for back pain. Skin: Negative for rash. Neurological: Negative for headaches, focal weakness or numbness.  10-point ROS otherwise negative.  ____________________________________________   PHYSICAL EXAM:  VITAL SIGNS: ED Triage Vitals  Enc Vitals Group     BP 07/17/16 1815 (!) 180/101     Pulse Rate 07/17/16 1815 89     Resp 07/17/16 1815 (!) 23     Temp 07/17/16 1815 98.4 F (36.9 C)     Temp Source 07/17/16 1815 Oral     SpO2 07/17/16 1809 (!) 83 %  Weight 07/17/16 1815 75 lb (34 kg)     Height 07/17/16 1815  (1.575 m)     Head Circumference --      Peak Flow --      Pain Score --      Pain Loc --      Pain Edu? --      Excl. in GC? --     Constitutional: Alert and oriented. Appearance of chronic illness but is not in acute distress at this time.  Cachexia. Eyes: Conjunctivae are normal. PERRL. EOMI. Head: Atraumatic. Nose: No congestion/rhinnorhea. Mouth/Throat: Mucous membranes are moist.  Oropharynx  non-erythematous. Neck: No stridor.  No meningeal signs.   Cardiovascular: Normal rate, regular rhythm. Good peripheral circulation. Grossly normal heart sounds without notable wheezing nor increased expiratory phase Respiratory: Normal respiratory effort.  No retractions. Lungs CTAB. Gastrointestinal: Soft and nontender. No distention.  Musculoskeletal: No lower extremity tenderness nor edema. No gross deformities of extremities. Neurologic:  Normal speech and language. No gross focal neurologic deficits are appreciated.  Skin:  Skin is warm, dry and intact. No rash noted. Psychiatric: Mood and affect are normal. Speech and behavior are normal.  ____________________________________________   LABS (all labs ordered are listed, but only abnormal results are displayed)  Labs Reviewed  BASIC METABOLIC PANEL - Abnormal; Notable for the following:       Result Value   Glucose, Bld 120 (*)    All other components within normal limits  CBC  TROPONIN I  TROPONIN I   ____________________________________________  EKG  ED ECG REPORT I, Moishe Schellenberg, the attending physician, personally viewed and interpreted this ECG.  Date: 07/17/2016 EKG Time: 18:10 Rate: 90 Rhythm: normal sinus rhythm QRS Axis: normal Intervals: normal ST/T Wave abnormalities: normal Conduction Disturbances: none Narrative Interpretation: unremarkable  ____________________________________________  RADIOLOGY   Dg Chest 2 View  Result Date: 07/17/2016 CLINICAL DATA:  Left-sided chest pain that started this afternoon. EXAM: CHEST  2 VIEW COMPARISON:  07/07/2016. FINDINGS: Marked hyperexpansion consistent with emphysema. Left upper lobe scarring is stable in appearance. No edema or focal airspace consolidation. No pneumothorax. No pulmonary edema or pleural effusion. The cardiopericardial silhouette is within normal limits for size. The visualized bony structures of the thorax are intact. Telemetry leads overlie  the chest. IMPRESSION: Emphysema with stable scarring left upper lobe. No acute findings. Electronically Signed   By: Kennith Center M.D.   On: 07/17/2016 18:55   ____________________________________________   PROCEDURES  Procedure(s) performed:   Procedures   ____________________________________________   INITIAL IMPRESSION / ASSESSMENT AND PLAN / ED COURSE  Pertinent labs & imaging results that were available during my care of the patient were reviewed by me and considered in my medical decision making (see chart for details).  The patient does not have any acute changes on her EKG that would indicate ischemia.  Her first set of labs was unremarkable.  She has no cardiac history and she has tobacco use and hypertension as risk factors besides her chronic lung disease.  However she does state that she has had 2 stress tests in the past, the last one within the last year, they were unremarkable.  We discussed whether she would want to stay in the hospital for chest pain workup or follow-up as an outpatient if the second troponin is negative, and she would prefer to go home if possible.  I will give her a full dose aspirin and we will check a second troponin.  She was initially  quite hypoxemic but that has not been replicated since coming to the emergency department; I do not know if it was an erroneous measurement.  She does not take oxygen at home.  We will keep her off the oxygen for a time and check up on her and make sure that she does not develop hypoxemia again.  Clinical Course  Comment By Time  The patient is resting comfortably and is satting 94% while asleep and on room air.  Her second troponin was negative.  I think anxiety played a component in her symptoms.  I believe she is appropriate for outpatient follow-up.  She agrees with this plan.    Loleta Rose, MD 07/25 2258    ____________________________________________  FINAL CLINICAL IMPRESSION(S) / ED DIAGNOSES  Final  diagnoses:  Chest pain, unspecified chest pain type     MEDICATIONS GIVEN DURING THIS VISIT:  Medications  aspirin chewable tablet 324 mg (324 mg Oral Given 07/17/16 2104)  LORazepam (ATIVAN) tablet 1 mg (1 mg Oral Given 07/17/16 2130)     NEW OUTPATIENT MEDICATIONS STARTED DURING THIS VISIT:  New Prescriptions   No medications on file      Note:  This document was prepared using Dragon voice recognition software and may include unintentional dictation errors.    Loleta Rose, MD 07/18/16 [id number]

## 2016-07-17 NOTE — ED Triage Notes (Signed)
Pt arrived to ED via EMS from home reporting SOB and nausea beginning this afternoon. PT has hx of COPD and per EMS was stating 85% on RA upon EMS arrival. Upon arrival to ED pt is stating 98% on 2 L. Pt reports hx of anxiety as well and is verbalizing chest pain or "pain in my heart" that began at same time as SOB. Diminished lung sounds noted in left lower lobe. Pt anxious upon arrival  And shaking legs. In no other distress at this time. PT alert and oriented.

## 2016-07-21 ENCOUNTER — Encounter: Payer: Self-pay | Admitting: Emergency Medicine

## 2016-07-21 ENCOUNTER — Emergency Department
Admission: EM | Admit: 2016-07-21 | Discharge: 2016-07-21 | Disposition: A | Discharge status: Home / Self Care | Payer: Self-pay | Attending: Emergency Medicine | Admitting: Emergency Medicine

## 2016-07-21 DIAGNOSIS — R5383 Other fatigue: Secondary | ICD-10-CM | POA: Insufficient documentation

## 2016-07-21 DIAGNOSIS — I10 Essential (primary) hypertension: Secondary | ICD-10-CM | POA: Insufficient documentation

## 2016-07-21 DIAGNOSIS — J449 Chronic obstructive pulmonary disease, unspecified: Secondary | ICD-10-CM | POA: Insufficient documentation

## 2016-07-21 DIAGNOSIS — F17210 Nicotine dependence, cigarettes, uncomplicated: Secondary | ICD-10-CM | POA: Insufficient documentation

## 2016-07-21 DIAGNOSIS — Z79899 Other long term (current) drug therapy: Secondary | ICD-10-CM | POA: Insufficient documentation

## 2016-07-21 LAB — URINALYSIS COMPLETE WITH MICROSCOPIC (ARMC ONLY)
BILIRUBIN URINE: NEGATIVE
Bacteria, UA: NONE SEEN
GLUCOSE, UA: NEGATIVE mg/dL
Hgb urine dipstick: NEGATIVE
KETONES UR: NEGATIVE mg/dL
Leukocytes, UA: NEGATIVE
Nitrite: NEGATIVE
Protein, ur: NEGATIVE mg/dL
RBC / HPF: NONE SEEN RBC/hpf (ref 0–5)
Specific Gravity, Urine: 1.005 (ref 1.005–1.030)
pH: 6 (ref 5.0–8.0)

## 2016-07-21 LAB — CBC WITH DIFFERENTIAL/PLATELET
Basophils Absolute: 0 10*3/uL (ref 0–0.1)
Basophils Relative: 0 %
EOS ABS: 0 10*3/uL (ref 0–0.7)
EOS PCT: 0 %
HCT: 44.3 % (ref 35.0–47.0)
Hemoglobin: 15.4 g/dL (ref 12.0–16.0)
LYMPHS ABS: 1 10*3/uL (ref 1.0–3.6)
LYMPHS PCT: 16 %
MCH: 30.8 pg (ref 26.0–34.0)
MCHC: 34.7 g/dL (ref 32.0–36.0)
MCV: 89 fL (ref 80.0–100.0)
Monocytes Absolute: 0.3 10*3/uL (ref 0.2–0.9)
Monocytes Relative: 5 %
NEUTROS ABS: 4.9 10*3/uL (ref 1.4–6.5)
Neutrophils Relative %: 79 %
PLATELETS: 191 10*3/uL (ref 150–440)
RBC: 4.98 MIL/uL (ref 3.80–5.20)
RDW: 13.5 % (ref 11.5–14.5)
WBC: 6.3 10*3/uL (ref 3.6–11.0)

## 2016-07-21 LAB — COMPREHENSIVE METABOLIC PANEL
ALBUMIN: 4.4 g/dL (ref 3.5–5.0)
ALT: 11 U/L — AB (ref 14–54)
AST: 19 U/L (ref 15–41)
Alkaline Phosphatase: 96 U/L (ref 38–126)
Anion gap: 5 (ref 5–15)
BUN: 7 mg/dL (ref 6–20)
CHLORIDE: 105 mmol/L (ref 101–111)
CO2: 31 mmol/L (ref 22–32)
CREATININE: 0.54 mg/dL (ref 0.44–1.00)
Calcium: 9.5 mg/dL (ref 8.9–10.3)
GFR calc Af Amer: >60 mL/min (ref >60)
GLUCOSE: 111 mg/dL — AB (ref 65–99)
Potassium: 3.9 mmol/L (ref 3.5–5.1)
Sodium: 141 mmol/L (ref 135–145)
Total Bilirubin: 1 mg/dL (ref 0.3–1.2)
Total Protein: 6.9 g/dL (ref 6.5–8.1)

## 2016-07-21 LAB — TROPONIN I: Troponin I: <0.03 ng/mL (ref <0.03)

## 2016-07-21 LAB — TSH: TSH: 0.585 u[IU]/mL (ref 0.350–4.500)

## 2016-07-21 MED ORDER — SODIUM CHLORIDE 0.9 % IV BOLUS (SEPSIS)
1000.0000 mL | Freq: Once | INTRAVENOUS | Status: AC
  Administered 2016-07-21: 1000 mL via INTRAVENOUS

## 2016-07-21 NOTE — ED Provider Notes (Signed)
[facility] Emergency Department Provider Note ____________________________________________  Time seen: I have reviewed the triage vital signs and the triage nursing note.  HISTORY  Chief Complaint Dizziness and Diarrhea   Historian Patient and her husband  HPI Ashley [name] is a 60 y.o. female who is here for evaluation of fatigue. The patient states that "nobody can figure out what's wrong with me. "She states that she for at least the last 2 weeks has felt like she couldn't get out of bed because she feels so fatigued. No particular pain. She does have baseline shortness of breath, but reports no worsening compared to her underlying COPD. She states she typically does not have any chest discomfort, but was evaluated this past week in the ED for an episode with chest pain, but that has not recurred. She's not had a fever. She's not had chills. She reports that she is eating fine, although she does have a diagnosis within the past month or so hospital discharge of protein calorie malnutrition, severe.  She states that she has a primary care physician at [facility], however she has not been seen there anytime recently because she states that it takes a long time to get an appointment, and her husband works out of town and does not know his schedule that for advance, and since she does not drive, he is her only mode of transportation.  She has a diagnosis of anxiety, but does not report any specific worsening of stress or anxiety. She does state that she does not guard her house at all. It's unclear to me whether or not this is due to her fatigue which may have worsened over the past several weeks, or related to anxiety.    Past Medical History:  Diagnosis Date  . Anxiety   . COPD (chronic obstructive pulmonary disease) (HCC)   . Hypertension     Patient Active Problem List   Diagnosis Date Noted  . COPD (chronic obstructive pulmonary disease) (HCC)  06/01/2016  . HTN (hypertension) 06/01/2016  . Diarrhea 10/05/2015  . Protein-calorie malnutrition, severe 10/02/2015  . Malnutrition (HCC) 10/02/2015  . Pressure ulcer 10/01/2015  . SVT (supraventricular tachycardia) (HCC) 09/30/2015  . Pneumonia 09/30/2015  . Chest pain 09/22/2015    Past Surgical History:  Procedure Laterality Date  . ABDOMINAL HYSTERECTOMY      Prior to Admission medications   Medication Sig Start Date End Date Taking? Authorizing Provider  albuterol (PROAIR HFA) 108 (90 Base) MCG/ACT inhaler Inhale 2 puffs into the lungs every 6 (six) hours as needed for wheezing or shortness of breath. 06/02/16  Yes Ramonita Lab, MD  feeding supplement, ENSURE ENLIVE, (ENSURE ENLIVE) LIQD Take 237 mLs by mouth 3 (three) times daily between meals. 06/02/16  Yes Ramonita Lab, MD  Fluticasone-Salmeterol (ADVAIR DISKUS) 500-50 MCG/DOSE AEPB Inhale 1 puff into the lungs 2 (two) times daily.    Yes Historical Provider, MD  ipratropium-albuterol (DUONEB) 0.5-2.5 (3) MG/3ML SOLN Inhale 3 mLs into the lungs 4 (four) times daily as needed (for shortness of breath and/or wheezing).    Yes Historical Provider, MD  LORazepam (ATIVAN) 1 MG tablet Take 1 tablet (1 mg total) by mouth every 8 (eight) hours as needed for anxiety. 07/07/16 07/07/17 Yes Minna Antis, MD  predniSONE (DELTASONE) 20 MG tablet Take 2 tablets (40 mg total) by mouth daily. 07/07/16  Yes Minna Antis, MD    Allergies  Allergen Reactions  . Codeine Nausea Only    Family History  Problem Relation Age of Onset  . CAD Mother   . CAD Father   . Cancer - Prostate Brother   . Cancer - Lung Brother     Social History Social History  Substance Use Topics  . Smoking status: Current Every Day Smoker    Packs/day: 1.00    Types: Cigarettes  . Smokeless tobacco: Never Used  . Alcohol use No    Review of Systems  Constitutional: Negative for fever. Eyes: Negative for visual changes. ENT: Negative for sore  throat. Cardiovascular: Negative for chest pain in the last few days, just the one episode when she was seen a week or so ago.Marland Kitchen Respiratory: Chronic, but no worsening shortness of breath . Her baseline, she does not wear oxygen.. Gastrointestinal: Negative for abdominal pain, vomiting and diarrhea. Genitourinary: Negative for dysuria. Musculoskeletal: Negative for back pain. Skin: Negative for rash. Neurological: Negative for headache. 10 point Review of Systems otherwise negative ____________________________________________   PHYSICAL EXAM:  VITAL SIGNS: ED Triage Vitals [07/21/16 1109]  Enc Vitals Group     BP (!) 163/85     Pulse Rate 93     Resp 18     Temp 98.3 F (36.8 C)     Temp Source Oral     SpO2 98 %     Weight 75 lb (34 kg)     Height  (1.575 m)     Head Circumference      Peak Flow      Pain Score      Pain Loc      Pain Edu?      Excl. in GC?      Constitutional: Alert and oriented. Well appearing and in no distress.  Somewhat anxious and frustrated.  Cachectic. HEENT   Head: Normocephalic and atraumatic.      Eyes: Conjunctivae are normal. PERRL. Normal extraocular movements.      Ears:         Nose: No congestion/rhinnorhea.   Mouth/Throat: Mucous membranes are mildly dry..   Neck: No stridor. Cardiovascular/Chest: Normal rate, regular rhythm.  No murmurs, rubs, or gallops. Respiratory: Normal respiratory effort without tachypnea nor retractions. Breath sounds are clear and equal bilaterally. No wheezes/rales/rhonchi. Gastrointestinal: Soft. No distention, no guarding, no rebound. Nontender.   Genitourinary/rectal:Deferred Musculoskeletal: Nontender with normal range of motion in all extremities. No joint effusions.  No lower extremity tenderness.  No edema. Neurologic:  Normal speech and language. No gross or focal neurologic deficits are appreciated. Skin:  Skin is warm, dry and intact. No rash noted. Psychiatric: Seems anxious and  jittery.  No hallucinations. Mood seems a little depressed, the patient denies depression or suicidal ideation. Speech and behavior are normal. Patient exhibits appropriate insight and judgment.  ____________________________________________   EKG I, Governor Rooks, MD, the attending physician have personally viewed and interpreted all ECGs.  80 bpm. normal sinus rhythm. Narrow QRS. Normal axis. Nonspecific ST and T-wave. ____________________________________________  LABS (pertinent positives/negatives)  Labs Reviewed  COMPREHENSIVE METABOLIC PANEL - Abnormal; Notable for the following:       Result Value   Glucose, Bld 111 (*)    ALT 11 (*)    All other components within normal limits  URINALYSIS COMPLETEWITH MICROSCOPIC ([facility] ONLY) - Abnormal; Notable for the following:    Color, Urine YELLOW (*)    APPearance CLEAR (*)    Squamous Epithelial / LPF 0-5 (*)    All other components within normal limits  TROPONIN I  CBC  WITH DIFFERENTIAL/PLATELET  TSH    ____________________________________________  RADIOLOGY All Xrays were viewed by me. Imaging interpreted by Radiologist.  None __________________________________________  PROCEDURES  Procedure(s) performed: None  Critical Care performed: None  ____________________________________________   ED COURSE / ASSESSMENT AND PLAN  Pertinent labs & imaging results that were available during my care of the patient were reviewed by me and considered in my medical decision making (see chart for details).   This patient is here for what she describes as 2-3 weeks of fatigue that is limiting her even getting out of bed now.  She has been to the hospital several times in the last month or so, for COPD and nonspecific chest discomfort and fatigue.  Her exam and evaluation again today are reassuring. She does strike me as significantly anxious and maybe even agorophobic.  My suspicious is that mental health issues are certainly a  significant contributing factor here. She does need primary care follow-up and if she cannot find a time that works for her with her current [place] health center, I did also recommend the child to Center which also have Saturday hours which might help.  I've also given in the follow-up information for [facility].   CONSULTATIONS:  None.   Patient / Family / Caregiver informed of clinical course, medical decision-making process, and agree with plan.   I discussed return precautions, follow-up instructions, and discharged instructions with patient and/or family.   ___________________________________________   FINAL CLINICAL IMPRESSION(S) / ED DIAGNOSES   Final diagnoses:  Other fatigue              Note: This dictation was prepared with Dragon dictation. Any transcriptional errors that result from this process are unintentional    Governor Rooks, MD 07/24/16 1521

## 2016-07-21 NOTE — ED Triage Notes (Signed)
Pt. Reports feeling light headed when standing reports multiple episodes of diarrhea since 0430 this am. Denies vomiting, fever

## 2016-07-21 NOTE — Discharge Instructions (Signed)
You were evaluated for fatigue, and no emergency conditions are suspected with your examine evaluation reassuring in the emergency department stay.  As we discussed, you do need additional primary care evaluation, and if you are having trouble getting in with your [facility], you might try another [employer] [facility] community [facility] called [facility].  I suspect anxiety may be contributing to your symptoms, and I would also suggest a walk-in appointment for evaluation with [facility] as well.  Return to the emergency department for any worsening condition including chest pain, trouble breathing, fevers, vomiting, concern for dehydration, or any other symptoms concerning to you.

## 2016-08-05 ENCOUNTER — Emergency Department: Payer: Self-pay

## 2016-08-05 ENCOUNTER — Emergency Department
Admission: EM | Admit: 2016-08-05 | Discharge: 2016-08-05 | Disposition: A | Discharge status: Home / Self Care | Payer: Self-pay | Attending: Emergency Medicine | Admitting: Emergency Medicine

## 2016-08-05 ENCOUNTER — Encounter: Payer: Self-pay | Admitting: Emergency Medicine

## 2016-08-05 DIAGNOSIS — I10 Essential (primary) hypertension: Secondary | ICD-10-CM | POA: Insufficient documentation

## 2016-08-05 DIAGNOSIS — J441 Chronic obstructive pulmonary disease with (acute) exacerbation: Secondary | ICD-10-CM | POA: Insufficient documentation

## 2016-08-05 DIAGNOSIS — F17210 Nicotine dependence, cigarettes, uncomplicated: Secondary | ICD-10-CM | POA: Insufficient documentation

## 2016-08-05 LAB — CBC
HEMATOCRIT: 47 % (ref 35.0–47.0)
HEMOGLOBIN: 16 g/dL (ref 12.0–16.0)
MCH: 30.4 pg (ref 26.0–34.0)
MCHC: 34.1 g/dL (ref 32.0–36.0)
MCV: 89.3 fL (ref 80.0–100.0)
Platelets: 215 10*3/uL (ref 150–440)
RBC: 5.26 MIL/uL — AB (ref 3.80–5.20)
RDW: 13.8 % (ref 11.5–14.5)
WBC: 4.3 10*3/uL (ref 3.6–11.0)

## 2016-08-05 LAB — BASIC METABOLIC PANEL
Anion gap: 8 (ref 5–15)
BUN: 9 mg/dL (ref 6–20)
CHLORIDE: 106 mmol/L (ref 101–111)
CO2: 29 mmol/L (ref 22–32)
Calcium: 9.8 mg/dL (ref 8.9–10.3)
Creatinine, Ser: 0.54 mg/dL (ref 0.44–1.00)
GFR calc non Af Amer: >60 mL/min (ref >60)
Glucose, Bld: 124 mg/dL — ABNORMAL HIGH (ref 65–99)
POTASSIUM: 3.6 mmol/L (ref 3.5–5.1)
SODIUM: 143 mmol/L (ref 135–145)

## 2016-08-05 MED ORDER — AZITHROMYCIN 500 MG PO TABS
500.0000 mg | ORAL_TABLET | Freq: Once | ORAL | Status: AC
  Administered 2016-08-05: 500 mg via ORAL
  Filled 2016-08-05: qty 1

## 2016-08-05 MED ORDER — PREDNISONE 20 MG PO TABS
40.0000 mg | ORAL_TABLET | Freq: Once | ORAL | Status: AC
Start: 2016-08-05 — End: 2016-08-05
  Administered 2016-08-05: 40 mg via ORAL
  Filled 2016-08-05: qty 2

## 2016-08-05 MED ORDER — PREDNISONE 20 MG PO TABS: 40.0000 mg | ORAL_TABLET | Freq: Every day | ORAL | 0 refills | Status: DC

## 2016-08-05 MED ORDER — IPRATROPIUM-ALBUTEROL 0.5-2.5 (3) MG/3ML IN SOLN
3.0000 mL | Freq: Once | RESPIRATORY_TRACT | Status: AC
  Administered 2016-08-05: 3 mL via RESPIRATORY_TRACT
  Filled 2016-08-05: qty 3

## 2016-08-05 MED ORDER — AZITHROMYCIN 250 MG PO TABS: ORAL_TABLET | ORAL | 0 refills | Status: DC

## 2016-08-05 MED ORDER — IPRATROPIUM-ALBUTEROL 18-103 MCG/ACT IN AERO: 2.0000 | INHALATION_SPRAY | Freq: Four times a day (QID) | RESPIRATORY_TRACT | 2 refills | Status: DC | PRN

## 2016-08-05 NOTE — ED Triage Notes (Signed)
Pt presents to ED via EMS from home c/o shortness of breath and hypoxia. Fire Dept told EMS pt was 70% on RA, they placed pt on 8L O2 via [facility], pt took her albuterol inhaler prior to EMS arrival. When EMS arrived pt was 99% on RA and remains so upon arrival to ED. No further c/o shortness of breath on arrival. Hx COPD, no home oxygen.

## 2016-08-05 NOTE — Discharge Instructions (Signed)
We believe that your symptoms are caused today by an exacerbation of your COPD, and possibly bronchitis.  Please take the prescribed medications and any medications that you have at home for your COPD.  Follow up with your doctor as recommended.  If you develop any new or worsening symptoms, including but not limited to fever, persistent vomiting, worsening shortness of breath, or other symptoms that concern you, please return to the Emergency Department immediately. ° °

## 2016-08-05 NOTE — ED Provider Notes (Signed)
[facility] Emergency Department Provider Note   ____________________________________________   First MD Initiated Contact with Patient 08/05/16 1105     (approximate)  I have reviewed the triage vital signs and the nursing notes.   HISTORY  Chief Complaint Shortness of Breath    HPI Ashley [name] is a 60 y.o. female presents for evaluation of "COPD".  Patient tells me that last night she started feeling short of breath and was "wheezing". She uses an inhaler last night and reports her symptoms didn't improve. Wheezing seem slightly worse this morning prompting her to call 911. She also reports she felt very anxious and that she has "panic" from time to time, this has been slightly better after a doctor prescribed her Ativan which she takes a half milligram at night.  No chest pain. No fevers or chills. Slight cough along with the wheezing, but denies any productive cough. No abdominal pain. Reports same symptoms many many times, and that treatment with inhalers and steroids has helped in the past.  She is to inhalers prior to coming to the ER, and reports her symptoms are better now.  Past Medical History:  Diagnosis Date  . Anxiety   . COPD (chronic obstructive pulmonary disease) (HCC)   . Hypertension     Patient Active Problem List   Diagnosis Date Noted  . COPD (chronic obstructive pulmonary disease) (HCC) 06/01/2016  . HTN (hypertension) 06/01/2016  . Diarrhea 10/05/2015  . Protein-calorie malnutrition, severe 10/02/2015  . Malnutrition (HCC) 10/02/2015  . Pressure ulcer 10/01/2015  . SVT (supraventricular tachycardia) (HCC) 09/30/2015  . Pneumonia 09/30/2015  . Chest pain 09/22/2015    Past Surgical History:  Procedure Laterality Date  . ABDOMINAL HYSTERECTOMY      Prior to Admission medications   Medication Sig Start Date End Date Taking? Authorizing Provider  albuterol (PROAIR HFA) 108 (90 Base) MCG/ACT inhaler Inhale 2 puffs  into the lungs every 6 (six) hours as needed for wheezing or shortness of breath. 06/02/16   Ramonita Lab, MD  albuterol-ipratropium (COMBIVENT) 18-103 MCG/ACT inhaler Inhale 2 puffs into the lungs 4 (four) times daily as needed for wheezing. 08/05/16 08/05/17  Sharyn Creamer, MD  azithromycin (ZITHROMAX) 250 MG tablet One tab daily 08/06/16   Sharyn Creamer, MD  feeding supplement, ENSURE ENLIVE, (ENSURE ENLIVE) LIQD Take 237 mLs by mouth 3 (three) times daily between meals. 06/02/16   Ramonita Lab, MD  Fluticasone-Salmeterol (ADVAIR DISKUS) 500-50 MCG/DOSE AEPB Inhale 1 puff into the lungs 2 (two) times daily.     Historical Provider, MD  LORazepam (ATIVAN) 1 MG tablet Take 1 tablet (1 mg total) by mouth every 8 (eight) hours as needed for anxiety. 07/07/16 07/07/17  Minna Antis, MD  predniSONE (DELTASONE) 20 MG tablet Take 2 tablets (40 mg total) by mouth daily with breakfast. 08/05/16   Sharyn Creamer, MD    Allergies Codeine  Family History  Problem Relation Age of Onset  . CAD Mother   . CAD Father   . Cancer - Prostate Brother   . Cancer - Lung Brother     Social History Social History  Substance Use Topics  . Smoking status: Current Every Day Smoker    Packs/day: 0.50    Types: Cigarettes  . Smokeless tobacco: Never Used  . Alcohol use No    Review of Systems Constitutional: No fever/chills Eyes: No visual changes. ENT: No sore throat. Cardiovascular: Denies chest pain. Respiratory: See history of present illness, Gastrointestinal: No abdominal  pain.  No nausea, no vomiting.  No diarrhea.  No constipation. Genitourinary: Negative for dysuria. Musculoskeletal: Negative for back pain. Skin: Negative for rash. Neurological: Negative for headaches, focal weakness or numbness.  10-point ROS otherwise negative.  ____________________________________________   PHYSICAL EXAM:  VITAL SIGNS: ED Triage Vitals  Enc Vitals Group     BP 08/05/16 1030 (!) 181/92     Pulse Rate 08/05/16  1030 87     Resp 08/05/16 1031 20     Temp 08/05/16 1031 98.6 F (37 C)     Temp Source 08/05/16 1031 Oral     SpO2 08/05/16 1030 98 %     Weight 08/05/16 1031 73 lb 8 oz (33.3 kg)     Height 08/05/16 1031 5\' 2"  (1.575 m)     Head Circumference --      Peak Flow --      Pain Score 08/05/16 1032 0     Pain Loc --      Pain Edu? --      Excl. in GC? --     Constitutional: Alert and oriented. Well appearing and in no acute distress.Fairly underweight, patient reports this is chronic. Eyes: Conjunctivae are normal. PERRL. EOMI. Head: Atraumatic. Nose: No congestion/rhinnorhea. Mouth/Throat: Mucous membranes are moist.  Oropharynx non-erythematous. Neck: No stridor.   Cardiovascular: Normal rate, regular rhythm. Grossly normal heart sounds.  Good peripheral circulation. Respiratory: Normal respiratory effort.  No retractions. Lungs CTAB.No wheezing exhibited. The patient tells me that her symptoms now are much better control of her wheezing has gone away. She speaks in full and clear sentences with normal 99% oxygenation on room air. Gastrointestinal: Soft and nontender. No distention. No abdominal bruits. No CVA tenderness. Musculoskeletal: No lower extremity tenderness nor edema.  No joint effusions. Neurologic:  Normal speech and language. No gross focal neurologic deficits are appreciated.  Skin:  Skin is warm, dry and intact. No rash noted. Psychiatric: Mood and affect are normal. Speech and behavior are normal.  ____________________________________________   LABS (all labs ordered are listed, but only abnormal results are displayed)  Labs Reviewed  BASIC METABOLIC PANEL - Abnormal; Notable for the following:       Result Value   Glucose, Bld 124 (*)    All other components within normal limits  CBC - Abnormal; Notable for the following:    RBC 5.26 (*)    All other components within normal limits   ____________________________________________  EKG  Reviewed and  interpreted by me at 10:40 AM Heart rate 90 QRS 90 QTc 460 Normal sinus rhythm, probable right atrial enlargement No evidence of acute ischemic abnormality. ____________________________________________  RADIOLOGY  Dg Chest 2 View  Result Date: 08/05/2016 CLINICAL DATA:  Short of breath and hypoxia EXAM: CHEST  2 VIEW COMPARISON:  07/17/2016 FINDINGS: Stable left upper lobe scar appears stable severe hyperaeration. Normal heart size. No pneumothorax. No pleural effusion. IMPRESSION: No active cardiopulmonary disease. Electronically Signed   By: Jolaine Click M.D.   On: 08/05/2016 11:45    ____________________________________________   PROCEDURES  Procedure(s) performed: None  Procedures  Critical Care performed: No  ____________________________________________   INITIAL IMPRESSION / ASSESSMENT AND PLAN / ED COURSE  Pertinent labs & imaging results that were available during my care of the patient were reviewed by me and considered in my medical decision making (see chart for details).  Patient presents for self describes COPD. The clinical history she gives with wheezing improving with inhalers and a previous history  of COPD appears consistent with her clinical history as well as exam. At present she is much improved. Appears that she improved after using too inhalers at home just previous to or with EMS. She exhibits no cardiac symptoms, and her respiratory pattern and oxygenation is normal here now. Based on her history of disease I suspect a mild COPD exacerbation. No pleuritic pain or evidence of leg swelling to suggest concern for pulmonary embolism, no symptoms of acute coronary syndrome, and her chest x-ray demonstrates clarity.  We'll discharge the patient home with a prescription for prednisone, azithromycin, and refill of her inhalers. Patient agreeable with plan. Careful return precautions discussed.  Clinical Course      ____________________________________________   FINAL CLINICAL IMPRESSION(S) / ED DIAGNOSES  Final diagnoses:  COPD exacerbation (HCC)   Vitals:   08/05/16 1031 08/05/16 1100  BP: (!) 181/92 (!) 165/74  Pulse: 81 82  Resp: 20 (!) 26  Temp: 98.6 F (37 C)        NEW MEDICATIONS STARTED DURING THIS VISIT:  New Prescriptions   ALBUTEROL-IPRATROPIUM (COMBIVENT) 18-103 MCG/ACT INHALER    Inhale 2 puffs into the lungs 4 (four) times daily as needed for wheezing.   AZITHROMYCIN (ZITHROMAX) 250 MG TABLET    One tab daily   PREDNISONE (DELTASONE) 20 MG TABLET    Take 2 tablets (40 mg total) by mouth daily with breakfast.     Note:  This document was prepared using Dragon voice recognition software and may include unintentional dictation errors.     Sharyn CreamerMark Jhamal Plucinski, MD 08/05/16 [phone]

## 2016-09-21 IMAGING — CR DG CHEST 2V
2 series · 2 of 2 positions shown · non-contrast
Comparison: Plain film 07/04/2016, 05/31/2016, CT 09/22/2015

CLINICAL DATA: 60-year-old female with a history of shortness of
breath

EXAM:
CHEST  2 VIEW

[chest pa]
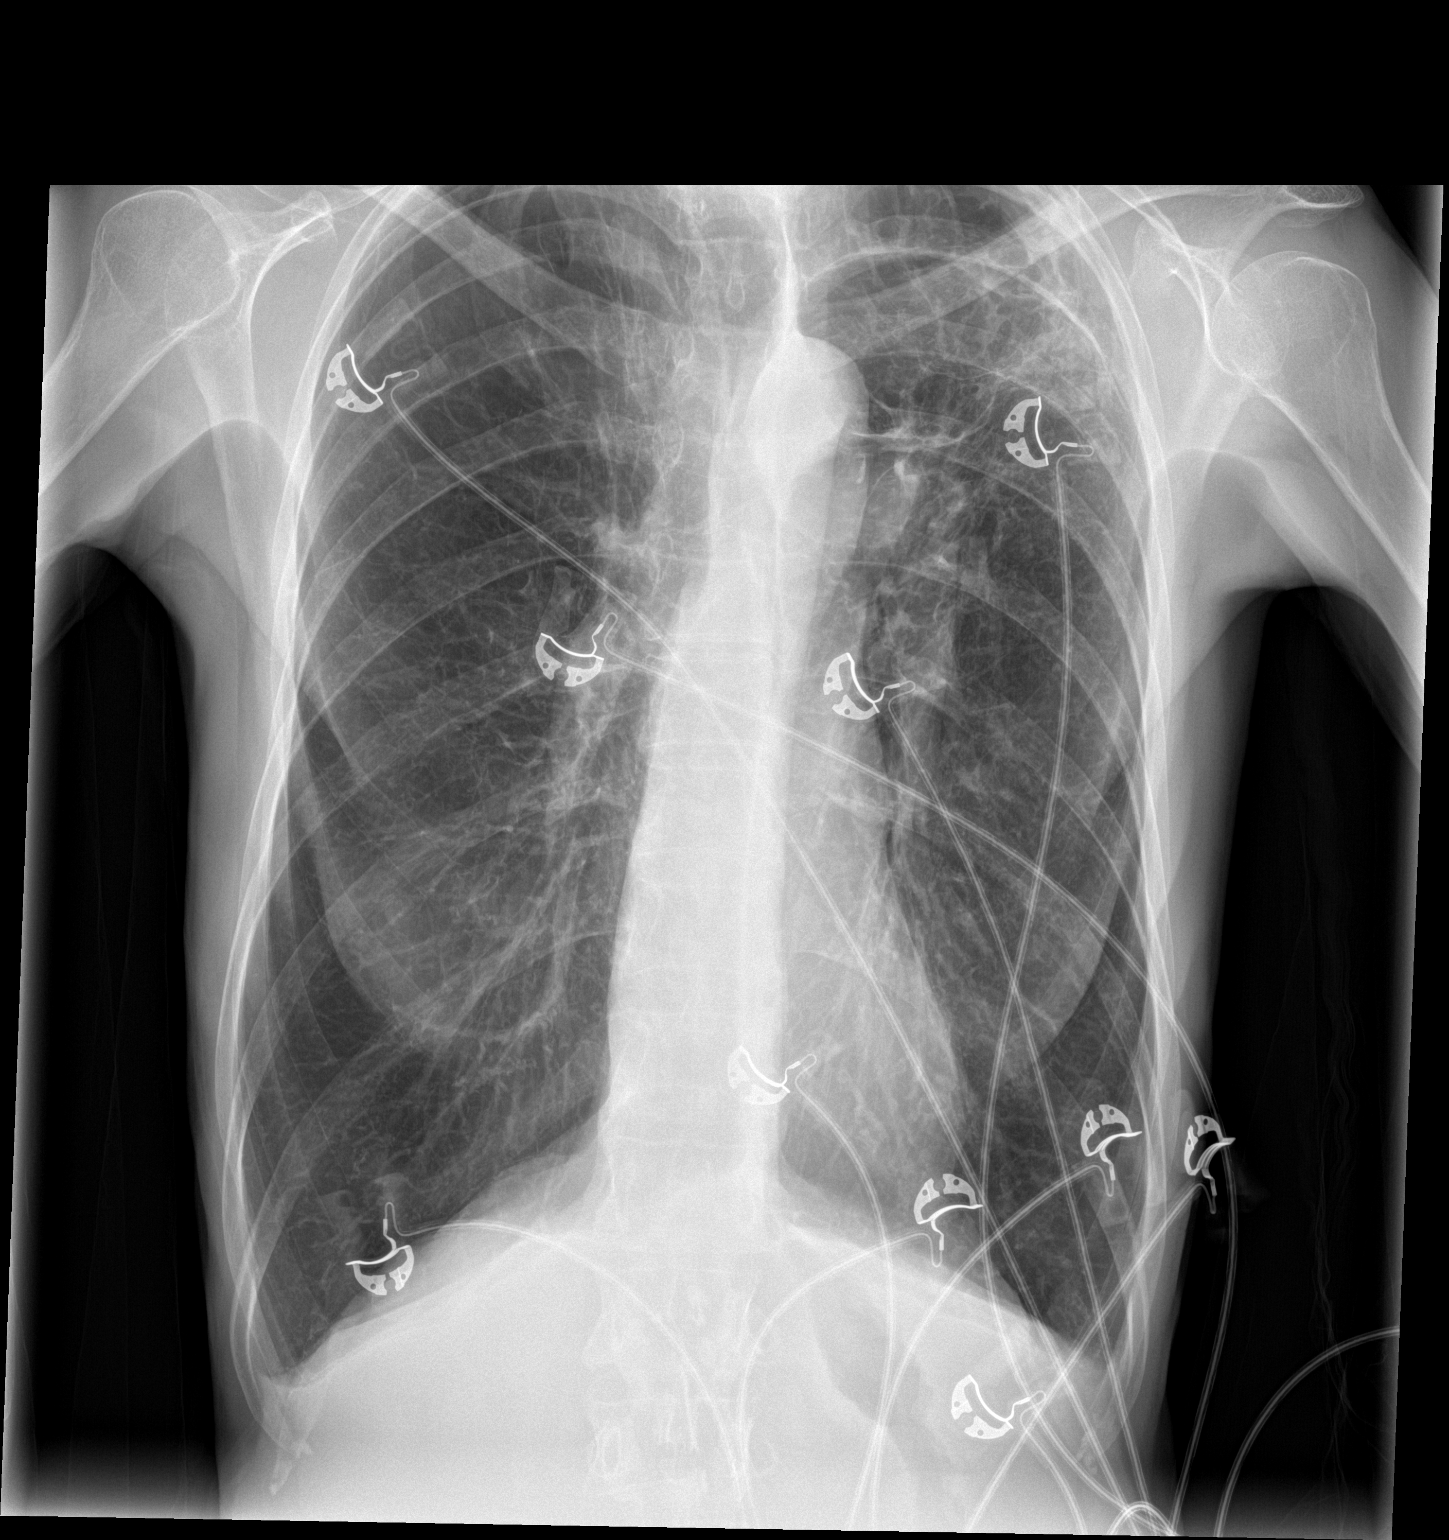

[chest lat]
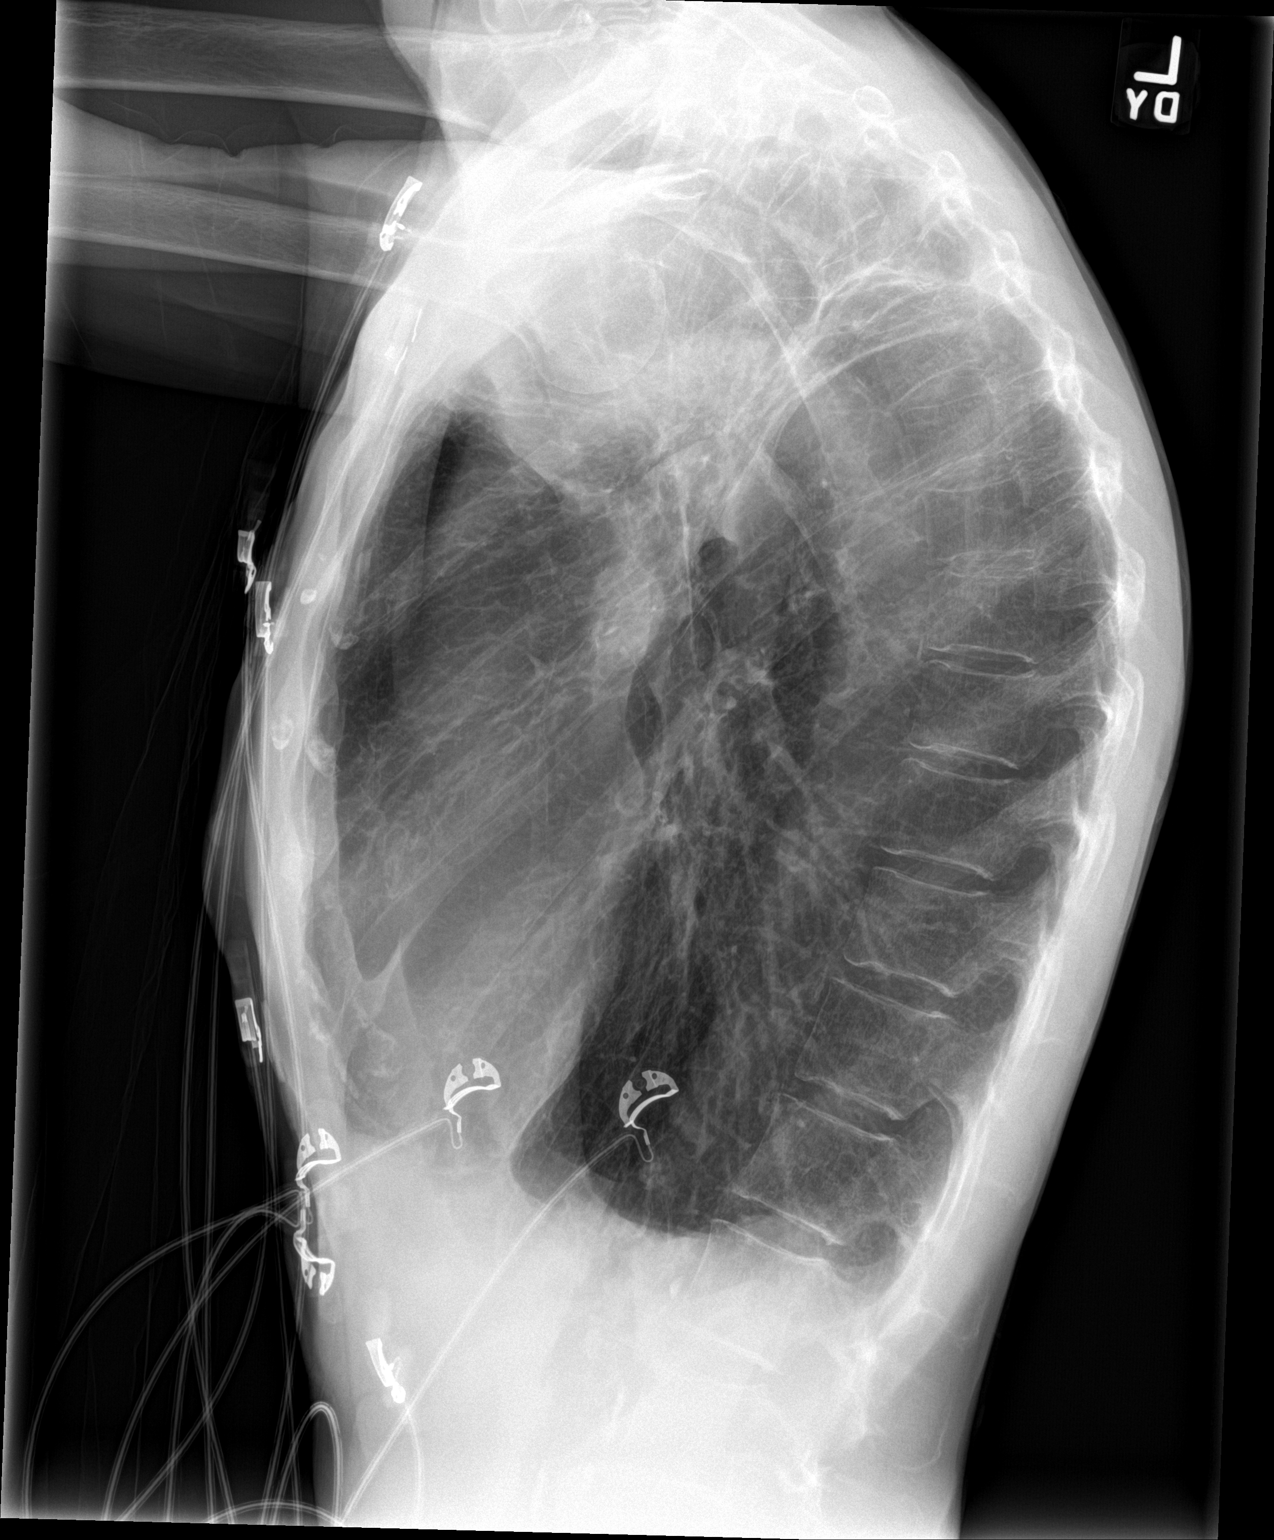

[2 of 2 positions shown; findings below may reference images not displayed]

FINDINGS: Cardiomediastinal silhouette unchanged. No evidence of pulmonary
vascular congestion.

Stigmata of emphysema, with increased retrosternal airspace,
flattened hemidiaphragms, increased AP diameter, and hyperinflation
on the AP view.

Architectural distortion with reticular opacity of the left upper
lobe, unchanged from comparison plain film and CT.

No pleural effusion. No pneumothorax. No confluent airspace disease.
IMPRESSION: Background of severe emphysema and chronic changes of the left upper
lobe, similar to comparison studies. No evidence of superimposed
acute cardiopulmonary disease.

## 2016-10-01 IMAGING — CR DG CHEST 2V
4 series · 4 of 4 positions shown · non-contrast
Comparison: 07/07/2016.

CLINICAL DATA: Left-sided chest pain that started this afternoon.

EXAM:
CHEST  2 VIEW

[chest lat (1 of 2)]
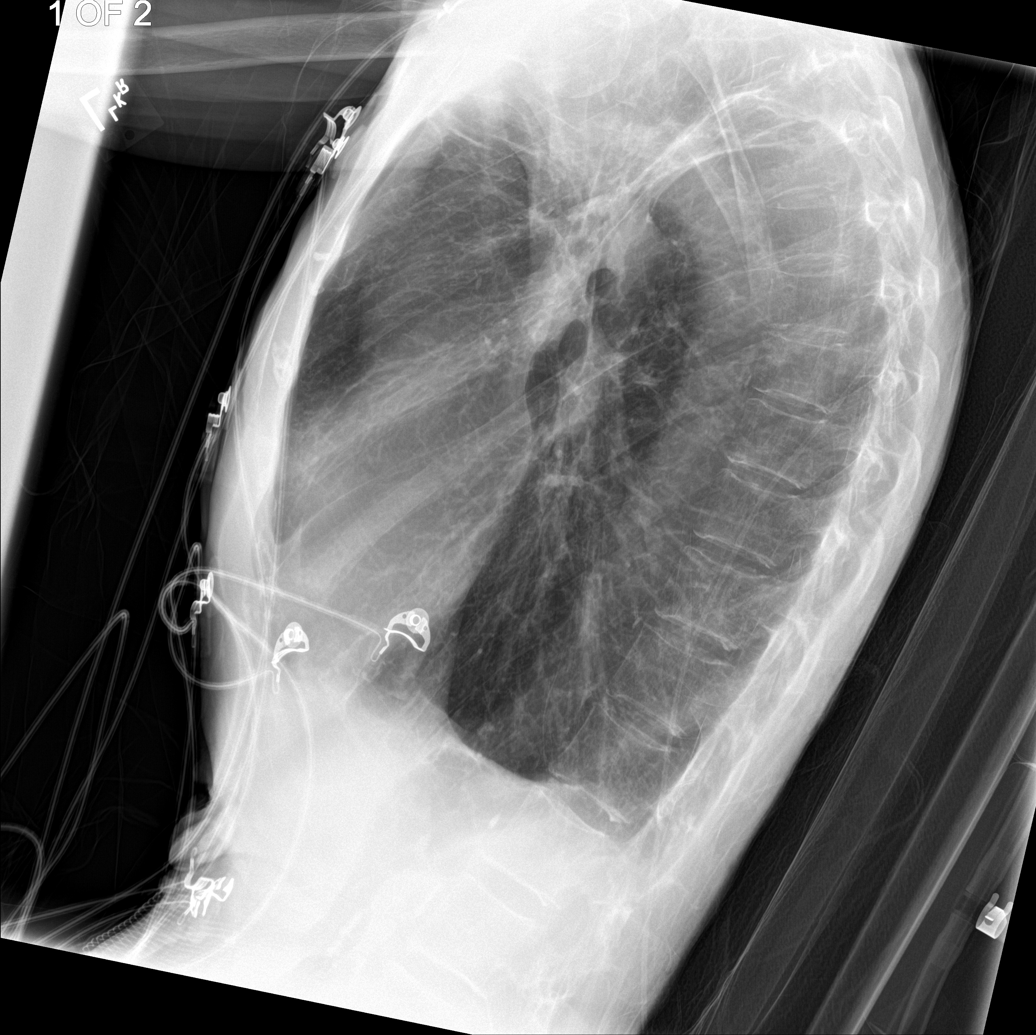

[chest ap (1 of 2)]
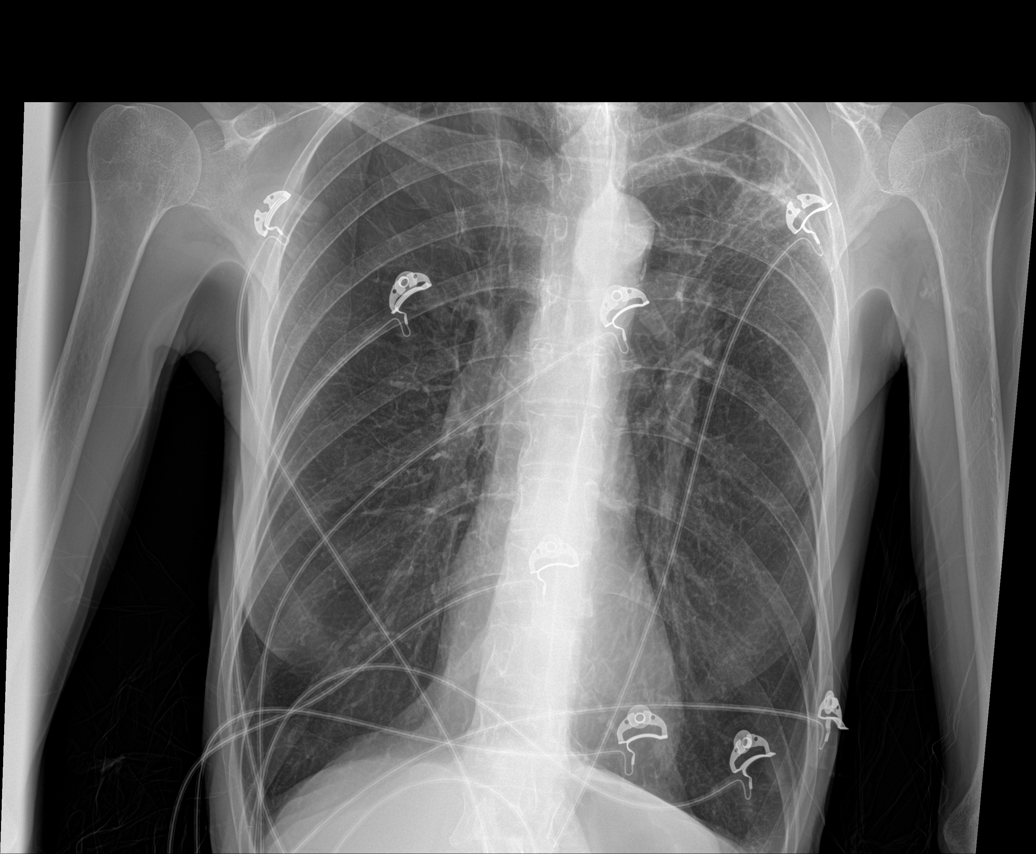

[chest ap (2 of 2)]
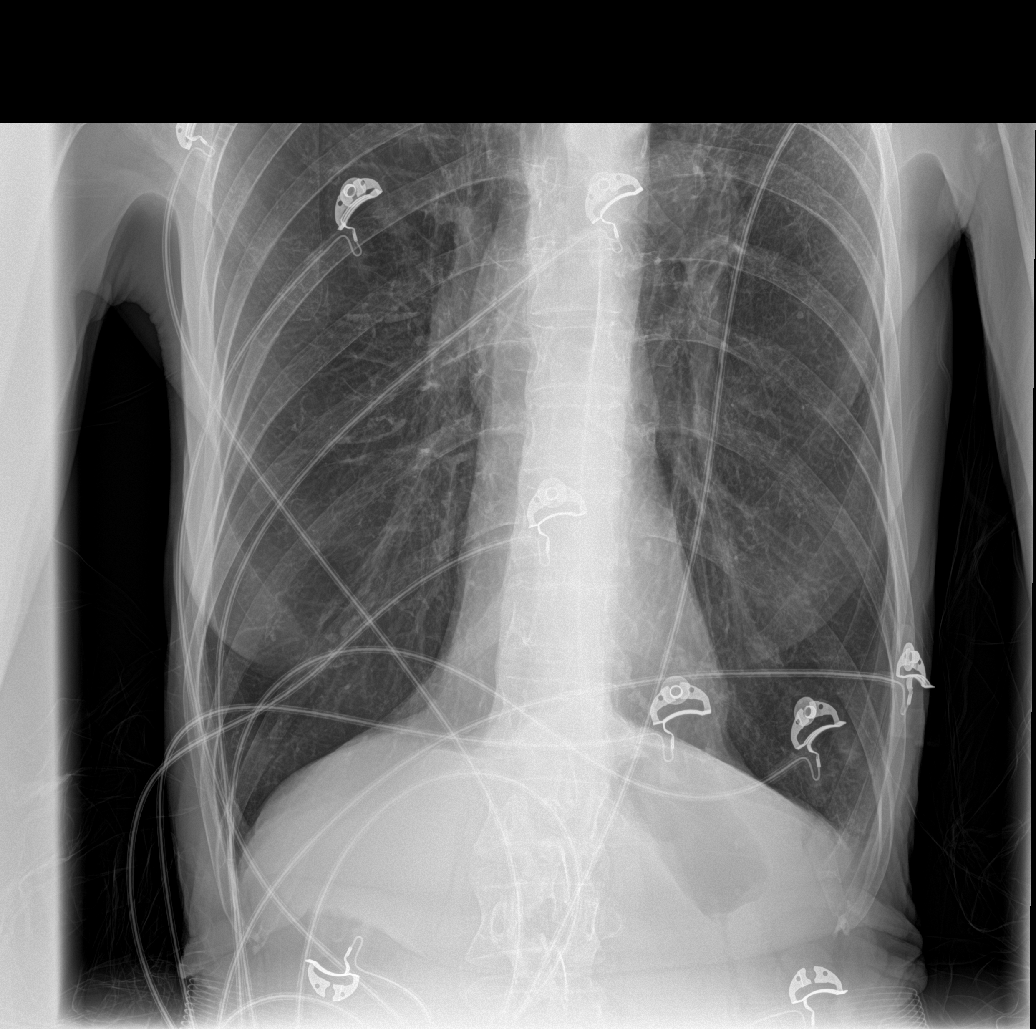

[chest lat (2 of 2)]
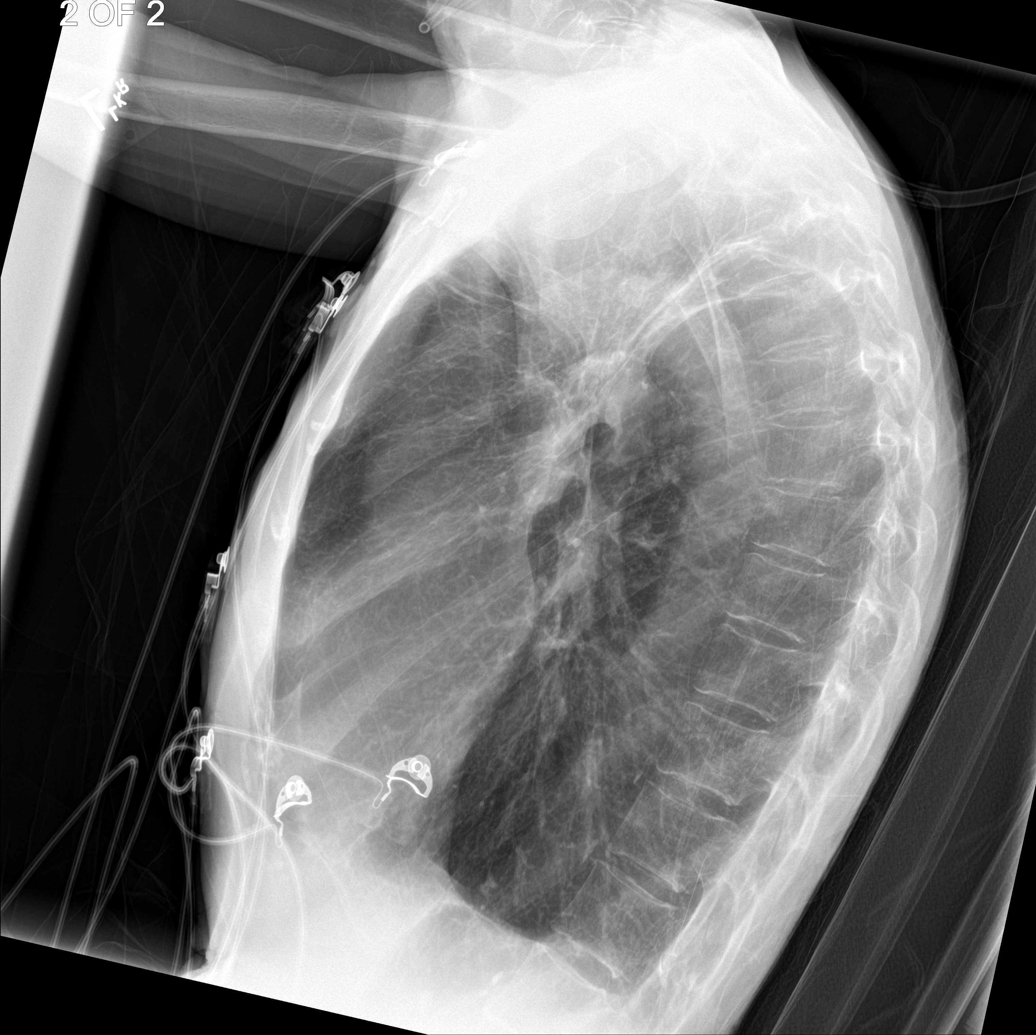

[4 of 4 positions shown; findings below may reference images not displayed]

FINDINGS: Marked hyperexpansion consistent with emphysema. Left upper lobe
scarring is stable in appearance. No edema or focal airspace
consolidation. No pneumothorax. No pulmonary edema or pleural
effusion. The cardiopericardial silhouette is within normal limits
for size. The visualized bony structures of the thorax are intact.
Telemetry leads overlie the chest.
IMPRESSION: Emphysema with stable scarring left upper lobe.

No acute findings.

## 2016-10-24 ENCOUNTER — Emergency Department: Payer: Self-pay

## 2016-10-24 ENCOUNTER — Encounter: Payer: Self-pay | Admitting: Emergency Medicine

## 2016-10-24 ENCOUNTER — Emergency Department
Admission: EM | Admit: 2016-10-24 | Discharge: 2016-10-24 | Disposition: A | Discharge status: Home / Self Care | Payer: Self-pay | Attending: Emergency Medicine | Admitting: Emergency Medicine

## 2016-10-24 DIAGNOSIS — R0602 Shortness of breath: Secondary | ICD-10-CM | POA: Insufficient documentation

## 2016-10-24 DIAGNOSIS — Z79899 Other long term (current) drug therapy: Secondary | ICD-10-CM | POA: Insufficient documentation

## 2016-10-24 DIAGNOSIS — F17210 Nicotine dependence, cigarettes, uncomplicated: Secondary | ICD-10-CM | POA: Insufficient documentation

## 2016-10-24 DIAGNOSIS — I10 Essential (primary) hypertension: Secondary | ICD-10-CM | POA: Insufficient documentation

## 2016-10-24 DIAGNOSIS — J449 Chronic obstructive pulmonary disease, unspecified: Secondary | ICD-10-CM | POA: Insufficient documentation

## 2016-10-24 DIAGNOSIS — R0789 Other chest pain: Secondary | ICD-10-CM | POA: Insufficient documentation

## 2016-10-24 MED ORDER — PREDNISONE 20 MG PO TABS: 40.0000 mg | ORAL_TABLET | Freq: Every day | ORAL | 0 refills | Status: AC

## 2016-10-24 MED ORDER — CLONIDINE HCL 0.1 MG PO TABS
0.1000 mg | ORAL_TABLET | Freq: Once | ORAL | Status: AC
  Administered 2016-10-24: 0.1 mg via ORAL
  Filled 2016-10-24: qty 1

## 2016-10-24 MED ORDER — LORAZEPAM 2 MG/ML IJ SOLN
0.5000 mg | Freq: Once | INTRAMUSCULAR | Status: AC
  Administered 2016-10-24: 0.5 mg via INTRAVENOUS
  Filled 2016-10-24: qty 1

## 2016-10-24 MED ORDER — IPRATROPIUM-ALBUTEROL 0.5-2.5 (3) MG/3ML IN SOLN
3.0000 mL | Freq: Once | RESPIRATORY_TRACT | Status: AC
  Administered 2016-10-24: 3 mL via RESPIRATORY_TRACT
  Filled 2016-10-24: qty 3

## 2016-10-24 NOTE — Discharge Instructions (Signed)
Please return immediately if condition worsens. Please contact her primary physician or the physician you were given for referral. If you have any specialist physicians involved in her treatment and plan please also contact them. Thank you for using [facility]. ° °

## 2016-10-24 NOTE — ED Triage Notes (Signed)
Pt to ED via EMS from home c/o SOB hx of COPD. Pt was seen at [facility] today for increased SOB and HTN, called EMS. Pt had duo neb at home this am, 125mg  solumedrol given en route. Pt is 98% on RA, 98 HR. Pt deneis any CP. Pt A&Ox4. Pt has not taken BP meds today.

## 2016-10-24 NOTE — ED Notes (Signed)
Patient transported to X-ray 

## 2016-10-24 NOTE — ED Provider Notes (Signed)
Time Seen: Approximately 1607 I have reviewed the triage notes  Chief Complaint: Shortness of Breath   History of Present Illness: Ashley [name] is a 60 y.o. female who presents with shortness of breath. Patient has a long history of COPD and was evaluated at her primary physician's office. She was short of breath there and hypertensive. She states that she has not her blood pressure medication today at this point she normally takes it on a regular basis in the morning. She states that she still feels short of breath. She denies any fever or productive cough at home. She describes some chest tightness which is consistent with her previous exacerbations of COPD.   Past Medical History:  Diagnosis Date  . Anxiety   . COPD (chronic obstructive pulmonary disease) (HCC)   . Hypertension     Patient Active Problem List   Diagnosis Date Noted  . COPD (chronic obstructive pulmonary disease) (HCC) 06/01/2016  . HTN (hypertension) 06/01/2016  . Diarrhea 10/05/2015  . Protein-calorie malnutrition, severe 10/02/2015  . Malnutrition (HCC) 10/02/2015  . Pressure ulcer 10/01/2015  . SVT (supraventricular tachycardia) (HCC) 09/30/2015  . Pneumonia 09/30/2015  . Chest pain 09/22/2015    Past Surgical History:  Procedure Laterality Date  . ABDOMINAL HYSTERECTOMY      Past Surgical History:  Procedure Laterality Date  . ABDOMINAL HYSTERECTOMY      Current Outpatient Rx  . Order #: [id number] Class: Print  . Order #: [id number] Class: Print  . Order #: [id number] Class: Print  . Order #: [id number] Class: Print  . Order #: [id number] Class: Historical Med  . Order #: [id number] Class: Print  . [START ON 10/25/2016] Order #: [id number] Class: Print    Allergies:  Codeine  Family History: Family History  Problem Relation Age of Onset  . CAD Mother   . CAD Father   . Cancer - Prostate Brother   . Cancer - Lung Brother     Social History: Social History  Substance Use Topics  . Smoking  status: Current Every Day Smoker    Packs/day: 0.50    Types: Cigarettes  . Smokeless tobacco: Never Used  . Alcohol use No     Review of Systems:   10 point review of systems was performed and was otherwise negative:  Constitutional: No fever Eyes: No visual disturbances ENT: No sore throat, ear pain Cardiac: No chest pain Respiratory: Shortness of breath without wheezing or stridor. Patient last used her inhaler this morning. Abdomen: No abdominal pain, no vomiting, No diarrhea Endocrine: No weight loss, No night sweats Extremities: No peripheral edema, cyanosis Skin: No rashes, easy bruising Neurologic: No focal weakness, trouble with speech or swollowing Urologic: No dysuria, Hematuria, or urinary frequency Patient denies any pulmonary emboli risk factors other than smoking  Physical Exam:  ED Triage Vitals  Enc Vitals Group     BP 10/24/16 1536 (!) 191/94     Pulse Rate 10/24/16 1536 96     Resp 10/24/16 1536 (!) 24     Temp 10/24/16 1536 98 F (36.7 C)     Temp Source 10/24/16 1536 Oral     SpO2 10/24/16 1536 97 %     Weight 10/24/16 1539 70 lb (31.8 kg)     Height 10/24/16 1539 5\' 2"  (1.575 m)     Head Circumference --      Peak Flow --      Pain Score 10/24/16 1539 0     Pain Loc --  Pain Edu? --      Excl. in GC? --     General: Awake , Alert , and Oriented times 3; GCS 15. Anxious without any signs of respiratory distress. Head: Normal cephalic , atraumatic Eyes: Pupils equal , round, reactive to light Nose/Throat: No nasal drainage, patent upper airway without erythema or exudate.  Neck: Supple, Full range of motion, No anterior adenopathy or palpable thyroid masses Lungs: Mild rhonchi heard primarily at the right apices without rales or wheezing noted  Heart: Regular rate, regular rhythm without murmurs , gallops , or rubs Abdomen: Soft, non tender without rebound, guarding , or rigidity; bowel sounds positive and symmetric in all 4 quadrants. No  organomegaly .        Extremities: 2 plus symmetric pulses. No edema, clubbing or cyanosis. No calf tenderness or swelling Neurologic: normal ambulation, Motor symmetric without deficits, sensory intact Skin: warm, dry, no rashes  EKG:  ED ECG REPORT I, Jennye Moccasin, the attending physician, personally viewed and interpreted this ECG.  Date: 10/24/2016 EKG Time: 1538 Rate: 96 Rhythm: normal sinus rhythm QRS Axis: normal Intervals: normal ST/T Wave abnormalities: Mild ST depression Conduction Disturbances: none Narrative Interpretation: unremarkable No acute ischemic changes noted   Radiology:  "Dg Chest 2 View  Result Date: 10/24/2016 CLINICAL DATA:  Shortness of breath since earlier today. History of COPD, current smoker. EXAM: CHEST  2 VIEW COMPARISON:  PA and lateral chest x-ray of August 05, 2016, May 31, 2016, and November 24, 2015. FINDINGS: The lungs remain hyperinflated with hemidiaphragm flattening and increased AP dimension of the thorax. There is increased parenchymal density peripherally in the left upper lobe stable since the August and June 2017 studies but less conspicuous than on the December 2016 study. There is no pneumothorax or pleural effusion. The heart and pulmonary vascularity are normal. There is calcification in the wall of the aortic arch. The bony thorax exhibits no acute abnormality. IMPRESSION: Severe hyperinflation consistent with COPD. Chronic abnormality peripherally in the left upper lobe not greatly changed since August 2017 and less conspicuous than on the earlier studies. No definite acute cardiopulmonary abnormality. If the patient's shortness of breath persists, chest CT scanning would be the most useful next imaging step. Aortic atherosclerosis. Electronically Signed   By: David  [place] M.D.   On: 10/24/2016 16:48  "  I personally reviewed the radiologic studies    ED Course:  [name] stay here was uneventful and she improved after a DuoNeb  here and blood pressure control with Catapres along with Ativan. [name] pulse ox remained stable and she has enough of her inhaler at home. Patient was given Solu-Medrol by EMS and will continue with prednisone on an outpatient basis which he's had before in the past works well. There does not appear to be any evidence of a pneumothorax or acute community-acquired pneumonia, etc. I felt laboratory work was not necessary and the patient has been following up appropriately with her primary physician. Clinical Course     Assessment:  Acute exacerbation of chronic obstructive pulmonary disease   Final Clinical Impression:   Final diagnoses:  Shortness of breath     Plan:  Outpatient Patient was advised to return immediately if condition worsens. Patient was advised to follow up with their primary care physician or other specialized physicians involved in their outpatient care. The patient and/or family member/power of attorney had laboratory results reviewed at the bedside. All questions and concerns were addressed and appropriate discharge instructions were  distributed by the nursing staff.             Jennye MoccasinBrian S Quigley, MD 10/24/16 [phone]

## 2016-10-25 ENCOUNTER — Encounter: Payer: Self-pay | Admitting: Emergency Medicine

## 2016-10-25 ENCOUNTER — Emergency Department: Payer: Self-pay

## 2016-10-25 ENCOUNTER — Emergency Department
Admission: EM | Admit: 2016-10-25 | Discharge: 2016-10-26 | Disposition: A | Discharge status: Home / Self Care | Payer: Self-pay | Attending: Emergency Medicine | Admitting: Emergency Medicine

## 2016-10-25 DIAGNOSIS — J449 Chronic obstructive pulmonary disease, unspecified: Secondary | ICD-10-CM | POA: Insufficient documentation

## 2016-10-25 DIAGNOSIS — I10 Essential (primary) hypertension: Secondary | ICD-10-CM | POA: Insufficient documentation

## 2016-10-25 DIAGNOSIS — F17210 Nicotine dependence, cigarettes, uncomplicated: Secondary | ICD-10-CM | POA: Insufficient documentation

## 2016-10-25 DIAGNOSIS — Z79899 Other long term (current) drug therapy: Secondary | ICD-10-CM | POA: Insufficient documentation

## 2016-10-25 DIAGNOSIS — R42 Dizziness and giddiness: Secondary | ICD-10-CM | POA: Insufficient documentation

## 2016-10-25 DIAGNOSIS — R079 Chest pain, unspecified: Secondary | ICD-10-CM | POA: Insufficient documentation

## 2016-10-25 LAB — CBC WITH DIFFERENTIAL/PLATELET
BASOS ABS: 0.1 10*3/uL (ref 0–0.1)
Basophils Relative: 1 %
Eosinophils Absolute: 0.1 10*3/uL (ref 0–0.7)
Eosinophils Relative: 1 %
HEMATOCRIT: 44.9 % (ref 35.0–47.0)
HEMOGLOBIN: 14.8 g/dL (ref 12.0–16.0)
LYMPHS PCT: 18 %
Lymphs Abs: 1.6 10*3/uL (ref 1.0–3.6)
MCH: 29.7 pg (ref 26.0–34.0)
MCHC: 33 g/dL (ref 32.0–36.0)
MCV: 89.9 fL (ref 80.0–100.0)
MONO ABS: 0.6 10*3/uL (ref 0.2–0.9)
Monocytes Relative: 6 %
NEUTROS ABS: 6.6 10*3/uL — AB (ref 1.4–6.5)
NEUTROS PCT: 74 %
Platelets: 233 10*3/uL (ref 150–440)
RBC: 5 MIL/uL (ref 3.80–5.20)
RDW: 13.6 % (ref 11.5–14.5)
WBC: 8.9 10*3/uL (ref 3.6–11.0)

## 2016-10-25 LAB — TROPONIN I: Troponin I: <0.03 ng/mL (ref <0.03)

## 2016-10-25 LAB — BASIC METABOLIC PANEL
ANION GAP: 7 (ref 5–15)
BUN: 13 mg/dL (ref 6–20)
CHLORIDE: 104 mmol/L (ref 101–111)
CO2: 29 mmol/L (ref 22–32)
Calcium: 9.6 mg/dL (ref 8.9–10.3)
Creatinine, Ser: 0.53 mg/dL (ref 0.44–1.00)
GFR calc Af Amer: >60 mL/min (ref >60)
GFR calc non Af Amer: >60 mL/min (ref >60)
GLUCOSE: 102 mg/dL — AB (ref 65–99)
POTASSIUM: 3.6 mmol/L (ref 3.5–5.1)
Sodium: 140 mmol/L (ref 135–145)

## 2016-10-25 MED ORDER — SODIUM CHLORIDE 0.9 % IV BOLUS (SEPSIS)
500.0000 mL | Freq: Once | INTRAVENOUS | Status: AC
Start: 2016-10-25 — End: 2016-10-25
  Administered 2016-10-25: 500 mL via INTRAVENOUS

## 2016-10-25 MED ORDER — LORAZEPAM 2 MG/ML IJ SOLN
INTRAMUSCULAR | Status: AC
  Administered 2016-10-25: 0.5 mg via INTRAVENOUS
  Filled 2016-10-25: qty 1

## 2016-10-25 MED ORDER — LORAZEPAM 2 MG/ML IJ SOLN
0.5000 mg | Freq: Once | INTRAMUSCULAR | Status: AC
  Administered 2016-10-25: 0.5 mg via INTRAVENOUS

## 2016-10-25 MED ORDER — PENTAFLUOROPROP-TETRAFLUOROETH EX AERO
INHALATION_SPRAY | CUTANEOUS | Status: AC
  Filled 2016-10-25: qty 30

## 2016-10-25 MED ORDER — IOPAMIDOL (ISOVUE-370) INJECTION 76%
75.0000 mL | Freq: Once | INTRAVENOUS | Status: AC | PRN
  Administered 2016-10-25: 75 mL via INTRAVENOUS

## 2016-10-25 NOTE — ED Notes (Signed)
ED Provider at bedside. 

## 2016-10-25 NOTE — ED Notes (Signed)
Unsuccessful IV (20G) start x2, veins are blowing during insertion.  Asking another RN to try.

## 2016-10-25 NOTE — ED Notes (Signed)
Ice bag applied to site where another RN tried to start an IV on patient's right underside FA, and the vein blew.  Swelling is noted at the site and patient is c/o pain from the attempt.

## 2016-10-25 NOTE — ED Provider Notes (Signed)
[facility] Emergency Department Provider Note  ____________________________________________   First MD Initiated Contact with Patient 10/25/16 1824     (approximate)  I have reviewed the triage vital signs and the nursing notes.   HISTORY  Chief Complaint Hypertension   HPI Ashley [name] is a 60 y.o. female with a history of anxiety, COPD and hypertension is present to the emergency department with a vague feeling like she will pass out. She denies any pain. Denies any headache. Denies any shortness of breath. Says that these symptoms have been ongoing since yesterday when she was evaluated here in the emergency department and had a reassuring workup and was sent home. She is also saying that she is having difficulty controlling her blood pressure and has been on lisinopril for several months without resolution of her blood pressure to normal levels. Patient denies feeling anxious or panic attacks.   Past Medical History:  Diagnosis Date  . Anxiety   . COPD (chronic obstructive pulmonary disease) (HCC)   . Hypertension     Patient Active Problem List   Diagnosis Date Noted  . COPD (chronic obstructive pulmonary disease) (HCC) 06/01/2016  . HTN (hypertension) 06/01/2016  . Diarrhea 10/05/2015  . Protein-calorie malnutrition, severe 10/02/2015  . Malnutrition (HCC) 10/02/2015  . Pressure ulcer 10/01/2015  . SVT (supraventricular tachycardia) (HCC) 09/30/2015  . Pneumonia 09/30/2015  . Chest pain 09/22/2015    Past Surgical History:  Procedure Laterality Date  . ABDOMINAL HYSTERECTOMY      Prior to Admission medications   Medication Sig Start Date End Date Taking? Authorizing Provider  albuterol (PROAIR HFA) 108 (90 Base) MCG/ACT inhaler Inhale 2 puffs into the lungs every 6 (six) hours as needed for wheezing or shortness of breath. 06/02/16  Yes Ramonita LabAruna Gouru, MD  Fluticasone-Salmeterol (ADVAIR DISKUS) 500-50 MCG/DOSE AEPB Inhale 1 puff into the  lungs 2 (two) times daily.    Yes Historical Provider, MD  lisinopril (PRINIVIL,ZESTRIL) 5 MG tablet Take 5 mg by mouth daily.   Yes Historical Provider, MD  LORazepam (ATIVAN) 1 MG tablet Take 1 tablet (1 mg total) by mouth every 8 (eight) hours as needed for anxiety. 07/07/16 07/07/17 Yes Minna AntisKevin Paduchowski, MD  predniSONE (DELTASONE) 20 MG tablet Take 2 tablets (40 mg total) by mouth daily. 10/25/16 10/30/16 Yes Jennye MoccasinBrian S Quigley, MD  tiotropium (SPIRIVA) 18 MCG inhalation capsule Place 18 mcg into inhaler and inhale daily.   Yes Historical Provider, MD  feeding supplement, ENSURE ENLIVE, (ENSURE ENLIVE) LIQD Take 237 mLs by mouth 3 (three) times daily between meals. 06/02/16   Ramonita LabAruna Gouru, MD    Allergies Codeine  Family History  Problem Relation Age of Onset  . CAD Mother   . CAD Father   . Cancer - Prostate Brother   . Cancer - Lung Brother     Social History Social History  Substance Use Topics  . Smoking status: Current Every Day Smoker    Packs/day: 0.50    Types: Cigarettes  . Smokeless tobacco: Never Used  . Alcohol use No    Review of Systems Constitutional: No fever/chills Eyes: No visual changes. ENT: No sore throat. Cardiovascular: Denies chest pain. Respiratory: Denies shortness of breath. Gastrointestinal: No abdominal pain.  No nausea, no vomiting.  No diarrhea.  No constipation. Genitourinary: Negative for dysuria. Musculoskeletal: Negative for back pain. Skin: Negative for rash. Neurological: Negative for headaches, focal weakness or numbness.  10-point ROS otherwise negative.  ____________________________________________   PHYSICAL EXAM:  VITAL SIGNS: ED Triage Vitals  Enc Vitals Group     BP 10/25/16 1832 (!) 147/106     Pulse Rate 10/25/16 1832 85     Resp 10/25/16 1832 18     Temp 10/25/16 1839 98.2 F (36.8 C)     Temp Source 10/25/16 1839 Oral     SpO2 10/25/16 1832 99 %     Weight --      Height --      Head Circumference --      Peak  Flow --      Pain Score 10/25/16 1837 0     Pain Loc --      Pain Edu? --      Excl. in GC? --     Constitutional: Alert and oriented.  Eyes: Conjunctivae are normal. PERRL. EOMI. Head: Atraumatic. Nose: No congestion/rhinnorhea. Mouth/Throat: Mucous membranes are moist.   Neck: No stridor.   Cardiovascular: Normal rate, regular rhythm. Grossly normal heart sounds.  Good peripheral circulation with intact, bilateral equal dorsalis pedis as well as radial pulses. Respiratory: Normal respiratory effort.  No retractions. Lungs CTAB. Gastrointestinal: Soft and nontender. No distention. No abdominal bruits. No CVA tenderness. Musculoskeletal: No lower extremity tenderness nor edema.  No joint effusions. Neurologic:  Normal speech and language. No gross focal neurologic deficits are appreciated.  Skin:  Skin is warm, dry and intact. No rash noted. Psychiatric: Mood and affect are normal. Speech and behavior are normal.  ____________________________________________   LABS (all labs ordered are listed, but only abnormal results are displayed)  Labs Reviewed  CBC WITH DIFFERENTIAL/PLATELET - Abnormal; Notable for the following:       Result Value   Neutro Abs 6.6 (*)    All other components within normal limits  BASIC METABOLIC PANEL - Abnormal; Notable for the following:    Glucose, Bld 102 (*)    All other components within normal limits  TROPONIN I  TROPONIN I   ____________________________________________  EKG   ED ECG REPORT I, Arelia LongestSchaevitz,  David M, the attending physician, personally viewed and interpreted this ECG.   Date: 10/25/2016  EKG Time: 1845  Rate: 85  Rhythm: normal sinus rhythm  Axis: Normal  Intervals:none  ST&T Change: Mildly peaked T waves with mild ST elevation seen in the precordial leads which do not appear significantly changed from 08/05/2016.  ____________________________________________  RADIOLOGY    CT Head Wo Contrast (Final result)    Result time 10/25/16 20:52:28  Final result by Adrian ProwsKim M Fujinaga, MD (10/25/16 20:52:28)           Narrative:   CLINICAL DATA: Light headed feeling palpitation high blood pressure  EXAM: CT HEAD WITHOUT CONTRAST  TECHNIQUE: Contiguous axial images were obtained from the base of the skull through the vertex without intravenous contrast.  COMPARISON: 08/20/2012  FINDINGS: Brain: No evidence of acute infarction, hemorrhage, hydrocephalus, extra-axial collection or mass lesion/mass effect.  Vascular: No hyperdense vessel or unexpected calcification.  Skull: Normal. Negative for fracture or focal lesion.  Sinuses/Orbits: No acute finding.  Other: None  IMPRESSION: No CT evidence for acute intracranial abnormality   Electronically Signed By: Jasmine PangKim Fujinaga M.D. On: 10/25/2016 20:52            CT Angio Chest Aorta W and/or Wo Contrast (Final result)  Result time 10/25/16 21:00:36  Final result by Norva PavlovElizabeth Brown, MD (10/25/16 21:00:36)           Narrative:   CLINICAL DATA: Per EMS, pt reports  going to her doctor's office yesterday for feeling like she was going to pass out. Pt reports swimmy headed feeling and palpitations. Dr's office sent pt to the ED from her appointment yesterday for high blood pressure.  EXAM: CT ANGIOGRAPHY CHEST WITH CONTRAST  TECHNIQUE: Multidetector CT imaging of the chest was performed using the standard protocol during bolus administration of intravenous contrast. Multiplanar CT image reconstructions and MIPs were obtained to evaluate the vascular anatomy.  CONTRAST: 75 cc Isovue 370  COMPARISON: 10/24/2016 and chest CT 09/22/2015  FINDINGS: Cardiovascular: Heart size is normal. There is significant atherosclerotic calcification of the thoracic aorta. Ascending aorta is ectatic but not aneurysmal. Pulmonary arteries are well opacified. There is no acute pulmonary embolus.  Mediastinum/Nodes: The visualized portion  of the thyroid gland has a normal appearance. No mediastinal, hilar, or axillary adenopathy.  Lungs/Pleura: Extensive emphysematous changes are present. There is septal thickening, atelectasis and scarring of the left upper lobe. Although the findings are favored to be chronic, there has been progression of these changes since previous CT in 2016. No suspicious pulmonary nodules. No pleural effusions.  Upper Abdomen: Left upper pole renal cyst.  Musculoskeletal: No chest wall abnormality. No acute or significant osseous findings.  Review of the MIP images confirms the above findings.  IMPRESSION: 1. Marked emphysematous changes. 2. Atelectasis and scarring involving the right upper lobe, likely chronic but progressed since 2016. 3. Technically adequate exam showing no acute pulmonary embolus. 4. Atherosclerosis of the thoracic aorta not associated with aneurysm. 5. Left renal cyst.   Electronically Signed By: Norva Pavlov M.D. On: 10/25/2016 21:00          ____________________________________________   PROCEDURES  Procedure(s) performed:   Procedures  Critical Care performed:   ____________________________________________   INITIAL IMPRESSION / ASSESSMENT AND PLAN / ED COURSE  Pertinent labs & imaging results that were available during my care of the patient were reviewed by me and considered in my medical decision making (see chart for details).  ----------------------------------------- 9:40 PM on 10/25/2016 -----------------------------------------  Patient is resting comfortably at this time. After Ativan the patient seems much more relaxed. She has had very reassuring imaging without any acute change. Also with reassuring lab work so far. Patient says that she has been taking her inhaler, once every hour and thinks this may be the cause of her feeling uncomfortable and her hypertension. Her blood pressure is now in the 120s over 70s. I advised her  that taking less puffs off of her albuterol would most likely help the sinuses symptoms and that she will should follow-up with her primary care doctors long as the second troponin is negative and request possible adjustments in her medication regimen including increasing her maintenance steroids. Pending second troponin at this time. Signed out to Dr. Sharma Covert.  Clinical Course     ____________________________________________   FINAL CLINICAL IMPRESSION(S) / ED DIAGNOSES  Acute hypertension.      NEW MEDICATIONS STARTED DURING THIS VISIT:  New Prescriptions   No medications on file     Note:  This document was prepared using Dragon voice recognition software and may include unintentional dictation errors.    Myrna Blazer, MD 10/25/16 [phone]

## 2016-10-25 NOTE — ED Triage Notes (Signed)
Per EMS, pt reports going to her doctor's office yesterday for feeling like she was going to pass out. Pt reports swimmy headed feeling and palpitations. Dr's office sent pt to the ED from her appointment yesterday for high blood pressure. Pt reports that she takes 5mg  of Lisinopril daily and that she doesn't think it's working. EMS VS BP 186/88, HR 104, 99% SpO2.

## 2016-10-25 NOTE — ED Notes (Signed)
Patient transported to CT 

## 2016-10-27 ENCOUNTER — Emergency Department
Admission: EM | Admit: 2016-10-27 | Discharge: 2016-10-27 | Disposition: A | Discharge status: Home / Self Care | Payer: Self-pay | Attending: Emergency Medicine | Admitting: Emergency Medicine

## 2016-10-27 ENCOUNTER — Encounter: Payer: Self-pay | Admitting: Medical Oncology

## 2016-10-27 DIAGNOSIS — I10 Essential (primary) hypertension: Secondary | ICD-10-CM

## 2016-10-27 DIAGNOSIS — R509 Fever, unspecified: Secondary | ICD-10-CM | POA: Insufficient documentation

## 2016-10-27 LAB — CBC
HCT: 47.1 % — ABNORMAL HIGH (ref 35.0–47.0)
Hemoglobin: 16 g/dL (ref 12.0–16.0)
MCH: 30 pg (ref 26.0–34.0)
MCHC: 33.9 g/dL (ref 32.0–36.0)
MCV: 88.7 fL (ref 80.0–100.0)
PLATELETS: 236 10*3/uL (ref 150–440)
RBC: 5.31 MIL/uL — AB (ref 3.80–5.20)
RDW: 13 % (ref 11.5–14.5)
WBC: 7.5 10*3/uL (ref 3.6–11.0)

## 2016-10-27 LAB — BASIC METABOLIC PANEL
Anion gap: 9 (ref 5–15)
BUN: 12 mg/dL (ref 6–20)
CALCIUM: 9.4 mg/dL (ref 8.9–10.3)
CO2: 28 mmol/L (ref 22–32)
CREATININE: 0.6 mg/dL (ref 0.44–1.00)
Chloride: 101 mmol/L (ref 101–111)
GFR calc non Af Amer: >60 mL/min (ref >60)
Glucose, Bld: 124 mg/dL — ABNORMAL HIGH (ref 65–99)
Potassium: 3.4 mmol/L — ABNORMAL LOW (ref 3.5–5.1)
SODIUM: 138 mmol/L (ref 135–145)

## 2016-10-27 LAB — TSH: TSH: 0.636 u[IU]/mL (ref 0.350–4.500)

## 2016-10-27 NOTE — ED Triage Notes (Signed)
Pt from home via ems with reports that she felt this am like her heart was racing and she began to get dizzy and "passed out". Pt reports she caught herself before she hit the ground. Denies pain. Pt was seen here Thursday and told to f/u with pcp so pt seen pcp yesterday and was placed on HCTZ, pt reports that one dose of the med made her "feel funny". Pt appears anxious.

## 2016-10-27 NOTE — Discharge Instructions (Signed)
Please seek medical attention for any high fevers, chest pain, shortness of breath, change in behavior, persistent vomiting, bloody stool or any other new or concerning symptoms.  

## 2016-10-27 NOTE — ED Provider Notes (Signed)
[facility] Emergency Department Provider Note   ____________________________________________   I have reviewed the triage vital signs and the nursing notes.   HISTORY  Chief Complaint Dizziness and Tachycardia   History limited by: Not Limited   HPI Ashley [name] is a 60 y.o. female who presents to the emergency department today for a near syncopal episode. This is now the patient's third emergency department visit in 4 days for similar symptoms. The patient says that she feels extremely dizzy when she stands up. She did not complain of any chest pain. The patient also has concern for elevated blood pressure. Work up in the ED so far has included multiple negative troponins, CT angiogram without any findings consistent with PE. No fevers.   Past Medical History:  Diagnosis Date  . Anxiety   . COPD (chronic obstructive pulmonary disease) (HCC)   . Hypertension     Patient Active Problem List   Diagnosis Date Noted  . COPD (chronic obstructive pulmonary disease) (HCC) 06/01/2016  . HTN (hypertension) 06/01/2016  . Diarrhea 10/05/2015  . Protein-calorie malnutrition, severe 10/02/2015  . Malnutrition (HCC) 10/02/2015  . Pressure ulcer 10/01/2015  . SVT (supraventricular tachycardia) (HCC) 09/30/2015  . Pneumonia 09/30/2015  . Chest pain 09/22/2015    Past Surgical History:  Procedure Laterality Date  . ABDOMINAL HYSTERECTOMY      Prior to Admission medications   Medication Sig Start Date End Date Taking? Authorizing Provider  albuterol (PROAIR HFA) 108 (90 Base) MCG/ACT inhaler Inhale 2 puffs into the lungs every 6 (six) hours as needed for wheezing or shortness of breath. 06/02/16   Ramonita LabAruna Gouru, MD  feeding supplement, ENSURE ENLIVE, (ENSURE ENLIVE) LIQD Take 237 mLs by mouth 3 (three) times daily between meals. 06/02/16   Ramonita LabAruna Gouru, MD  Fluticasone-Salmeterol (ADVAIR DISKUS) 500-50 MCG/DOSE AEPB Inhale 1 puff into the lungs 2 (two) times daily.      Historical Provider, MD  lisinopril (PRINIVIL,ZESTRIL) 5 MG tablet Take 5 mg by mouth daily.    Historical Provider, MD  LORazepam (ATIVAN) 1 MG tablet Take 1 tablet (1 mg total) by mouth every 8 (eight) hours as needed for anxiety. 07/07/16 07/07/17  Minna AntisKevin Paduchowski, MD  predniSONE (DELTASONE) 20 MG tablet Take 2 tablets (40 mg total) by mouth daily. 10/25/16 10/30/16  Jennye MoccasinBrian S Quigley, MD  tiotropium (SPIRIVA) 18 MCG inhalation capsule Place 18 mcg into inhaler and inhale daily.    Historical Provider, MD    Allergies Codeine  Family History  Problem Relation Age of Onset  . CAD Mother   . CAD Father   . Cancer - Prostate Brother   . Cancer - Lung Brother     Social History Social History  Substance Use Topics  . Smoking status: Current Every Day Smoker    Packs/day: 0.50    Types: Cigarettes  . Smokeless tobacco: Never Used  . Alcohol use No    Review of Systems  Constitutional: Negative for fever. Cardiovascular: Negative for chest pain. Respiratory: Negative for shortness of breath. Gastrointestinal: Negative for abdominal pain, vomiting and diarrhea. Neurological: Negative for headaches, focal weakness or numbness.  10-point ROS otherwise negative.  ____________________________________________   PHYSICAL EXAM:  VITAL SIGNS: ED Triage Vitals  Enc Vitals Group     BP 10/27/16 1022 (!) 151/101     Pulse Rate 10/27/16 1022 98     Resp 10/27/16 1022 17     Temp 10/27/16 1022 98.4 F (36.9 C)  Temp Source 10/27/16 1022 Oral     SpO2 10/27/16 1022 95 %     Weight 10/27/16 1023 70 lb (31.8 kg)     Height 10/27/16 1023 5\' 2"  (1.575 m)     Head Circumference --      Peak Flow --      Pain Score 10/27/16 1023 0   Constitutional: Alert and oriented. Slightly anxious appearing. Eyes: Conjunctivae are normal. Normal extraocular movements. ENT   Head: Normocephalic and atraumatic.   Nose: No congestion/rhinnorhea.   Mouth/Throat: Mucous membranes  are moist.   Neck: No stridor. Hematological/Lymphatic/Immunilogical: No cervical lymphadenopathy. Cardiovascular: Normal rate, regular rhythm.  No murmurs, rubs, or gallops.  Respiratory: Normal respiratory effort without tachypnea nor retractions. Breath sounds are clear and equal bilaterally. No wheezes/rales/rhonchi. Gastrointestinal: Soft and nontender. No distention.  Genitourinary: Deferred Musculoskeletal: Normal range of motion in all extremities. No lower extremity edema. Neurologic:  Normal speech and language. No gross focal neurologic deficits are appreciated.  Skin:  Skin is warm, dry and intact. No rash noted. Psychiatric: Mood and affect are normal. Speech and behavior are normal. Patient exhibits appropriate insight and judgment.  ____________________________________________    LABS (pertinent positives/negatives)  Labs Reviewed  BASIC METABOLIC PANEL - Abnormal; Notable for the following:       Result Value   Potassium 3.4 (*)    Glucose, Bld 124 (*)    All other components within normal limits  CBC - Abnormal; Notable for the following:    RBC 5.31 (*)    HCT 47.1 (*)    All other components within normal limits  TSH  URINALYSIS COMPLETEWITH MICROSCOPIC ([facility] ONLY)     ____________________________________________   EKG   I, Phineas SemenGraydon Mainor Hellmann, attending physician, personally viewed and interpreted this EKG  EKG Time: 1017 Rate: 104 Rhythm: sinus tachycardia Axis: normal Intervals: qtc 470 QRS: narrow, q waves V1, V2 ST changes: no st elevation Impression: abnormal ekg  q waves present on previous EKG ____________________________________________    RADIOLOGY  None  ____________________________________________   PROCEDURES  Procedures  ____________________________________________   INITIAL IMPRESSION / ASSESSMENT AND PLAN / ED COURSE  Pertinent labs & imaging results that were available during my care of the patient were reviewed  by me and considered in my medical decision making (see chart for details).  Patient with near syncopal episode. On exam patient is quite anxious and concerned about her blood pressure. She has been to her primary care doctors office and they have started her on blood pressure medication. Blood work here including TSH within normal limits. I had a discussion with the patient about the importance of following up with PCP. She expressed frustration that we were not able to tell her exactly why she is having these symptoms, however I tried to reassure her that work up so far has been unconcerning.   ____________________________________________   FINAL CLINICAL IMPRESSION(S) / ED DIAGNOSES  Final diagnoses:  Hypertension, unspecified type     Note: This dictation was prepared with Dragon dictation. Any transcriptional errors that result from this process are unintentional    Phineas SemenGraydon Floraine Buechler, MD 10/27/16 1504

## 2016-10-27 NOTE — ED Notes (Signed)
Pt was unable to sign for d/c instructions as E-sig was not working.

## 2017-02-17 ENCOUNTER — Emergency Department: Payer: Self-pay

## 2017-02-17 ENCOUNTER — Encounter: Payer: Self-pay | Admitting: Emergency Medicine

## 2017-02-17 ENCOUNTER — Inpatient Hospital Stay
Admission: EM | Admit: 2017-02-17 | Discharge: 2017-02-19 | DRG: 872 | Disposition: A | Discharge status: Home / Self Care | Payer: Self-pay | Attending: Internal Medicine | Admitting: Internal Medicine

## 2017-02-17 DIAGNOSIS — R197 Diarrhea, unspecified: Secondary | ICD-10-CM

## 2017-02-17 DIAGNOSIS — F17210 Nicotine dependence, cigarettes, uncomplicated: Secondary | ICD-10-CM | POA: Diagnosis present

## 2017-02-17 DIAGNOSIS — A084 Viral intestinal infection, unspecified: Secondary | ICD-10-CM | POA: Diagnosis present

## 2017-02-17 DIAGNOSIS — F419 Anxiety disorder, unspecified: Secondary | ICD-10-CM | POA: Diagnosis present

## 2017-02-17 DIAGNOSIS — R748 Abnormal levels of other serum enzymes: Secondary | ICD-10-CM

## 2017-02-17 DIAGNOSIS — Z7951 Long term (current) use of inhaled steroids: Secondary | ICD-10-CM

## 2017-02-17 DIAGNOSIS — J449 Chronic obstructive pulmonary disease, unspecified: Secondary | ICD-10-CM | POA: Diagnosis present

## 2017-02-17 DIAGNOSIS — Z8249 Family history of ischemic heart disease and other diseases of the circulatory system: Secondary | ICD-10-CM

## 2017-02-17 DIAGNOSIS — I10 Essential (primary) hypertension: Secondary | ICD-10-CM | POA: Diagnosis present

## 2017-02-17 DIAGNOSIS — A419 Sepsis, unspecified organism: Principal | ICD-10-CM | POA: Diagnosis present

## 2017-02-17 DIAGNOSIS — N390 Urinary tract infection, site not specified: Secondary | ICD-10-CM

## 2017-02-17 DIAGNOSIS — N3000 Acute cystitis without hematuria: Secondary | ICD-10-CM | POA: Diagnosis present

## 2017-02-17 DIAGNOSIS — Z90710 Acquired absence of both cervix and uterus: Secondary | ICD-10-CM

## 2017-02-17 LAB — CBC
HEMATOCRIT: 45.6 % (ref 35.0–47.0)
Hemoglobin: 15.7 g/dL (ref 12.0–16.0)
MCH: 30.9 pg (ref 26.0–34.0)
MCHC: 34.4 g/dL (ref 32.0–36.0)
MCV: 89.7 fL (ref 80.0–100.0)
PLATELETS: 223 10*3/uL (ref 150–440)
RBC: 5.09 MIL/uL (ref 3.80–5.20)
RDW: 13.6 % (ref 11.5–14.5)
WBC: 12 10*3/uL — ABNORMAL HIGH (ref 3.6–11.0)

## 2017-02-17 LAB — URINALYSIS, COMPLETE (UACMP) WITH MICROSCOPIC
Bilirubin Urine: NEGATIVE
Glucose, UA: NEGATIVE mg/dL
Ketones, ur: NEGATIVE mg/dL
Nitrite: NEGATIVE
PH: 6 (ref 5.0–8.0)
Protein, ur: NEGATIVE mg/dL
SPECIFIC GRAVITY, URINE: 1.005 (ref 1.005–1.030)
SQUAMOUS EPITHELIAL / LPF: NONE SEEN

## 2017-02-17 LAB — COMPREHENSIVE METABOLIC PANEL
ALBUMIN: 4.2 g/dL (ref 3.5–5.0)
ALT: 12 U/L — AB (ref 14–54)
AST: 23 U/L (ref 15–41)
Alkaline Phosphatase: 92 U/L (ref 38–126)
Anion gap: 6 (ref 5–15)
BUN: 14 mg/dL (ref 6–20)
CHLORIDE: 107 mmol/L (ref 101–111)
CO2: 29 mmol/L (ref 22–32)
CREATININE: 0.49 mg/dL (ref 0.44–1.00)
Calcium: 9.2 mg/dL (ref 8.9–10.3)
GFR calc Af Amer: >60 mL/min (ref >60)
GLUCOSE: 120 mg/dL — AB (ref 65–99)
Potassium: 3.6 mmol/L (ref 3.5–5.1)
Sodium: 142 mmol/L (ref 135–145)
Total Bilirubin: 0.6 mg/dL (ref 0.3–1.2)
Total Protein: 6.7 g/dL (ref 6.5–8.1)

## 2017-02-17 LAB — RAPID HIV SCREEN (HIV 1/2 AB+AG)
HIV 1/2 Antibodies: NONREACTIVE
HIV-1 P24 Antigen - HIV24: NONREACTIVE

## 2017-02-17 LAB — LIPASE, BLOOD: LIPASE: 89 U/L — AB (ref 11–51)

## 2017-02-17 LAB — INFLUENZA PANEL BY PCR (TYPE A & B)
INFLAPCR: NEGATIVE
INFLBPCR: NEGATIVE

## 2017-02-17 LAB — TROPONIN I

## 2017-02-17 LAB — LACTIC ACID, PLASMA: Lactic Acid, Venous: 1.3 mmol/L (ref 0.5–1.9)

## 2017-02-17 MED ORDER — DEXTROSE 5 % IV SOLN: 1.0000 g | Freq: Once | INTRAVENOUS | Status: DC

## 2017-02-17 MED ORDER — CEPHALEXIN 500 MG PO CAPS: 500.0000 mg | ORAL_CAPSULE | Freq: Two times a day (BID) | ORAL | 0 refills | Status: DC

## 2017-02-17 MED ORDER — ENOXAPARIN SODIUM 30 MG/0.3ML ~~LOC~~ SOLN
30.0000 mg | SUBCUTANEOUS | Status: DC
  Administered 2017-02-17 – 2017-02-18 (×2): 30 mg via SUBCUTANEOUS
  Filled 2017-02-17 (×2): qty 0.3

## 2017-02-17 MED ORDER — CEFTRIAXONE SODIUM-DEXTROSE 1-3.74 GM-% IV SOLR
1.0000 g | Freq: Once | INTRAVENOUS | Status: AC
  Administered 2017-02-17: 1 g via INTRAVENOUS
  Filled 2017-02-17: qty 50

## 2017-02-17 MED ORDER — TIOTROPIUM BROMIDE MONOHYDRATE 18 MCG IN CAPS
18.0000 ug | ORAL_CAPSULE | Freq: Every day | RESPIRATORY_TRACT | Status: DC
  Administered 2017-02-17 – 2017-02-19 (×3): 18 ug via RESPIRATORY_TRACT
  Filled 2017-02-17: qty 5

## 2017-02-17 MED ORDER — SODIUM CHLORIDE 0.9 % IV BOLUS (SEPSIS)
1000.0000 mL | Freq: Once | INTRAVENOUS | Status: AC
  Administered 2017-02-17: 1000 mL via INTRAVENOUS

## 2017-02-17 MED ORDER — DEXTROSE 5 % IV SOLN
1.0000 g | INTRAVENOUS | Status: DC
  Administered 2017-02-18: 1 g via INTRAVENOUS
  Filled 2017-02-17: qty 10

## 2017-02-17 MED ORDER — SODIUM CHLORIDE 0.9 % IV SOLN
INTRAVENOUS | Status: AC
Start: 2017-02-17 — End: 2017-02-18
  Administered 2017-02-17: 17:00:00 via INTRAVENOUS
  Administered 2017-02-18: 75 mL/h via INTRAVENOUS

## 2017-02-17 MED ORDER — ONDANSETRON HCL 4 MG PO TABS: 4.0000 mg | ORAL_TABLET | Freq: Four times a day (QID) | ORAL | Status: DC | PRN

## 2017-02-17 MED ORDER — ONDANSETRON HCL 4 MG/2ML IJ SOLN
4.0000 mg | Freq: Four times a day (QID) | INTRAMUSCULAR | Status: DC | PRN
  Administered 2017-02-17 – 2017-02-19 (×4): 4 mg via INTRAVENOUS
  Filled 2017-02-17 (×4): qty 2

## 2017-02-17 MED ORDER — ONDANSETRON HCL 4 MG/2ML IJ SOLN
4.0000 mg | Freq: Once | INTRAMUSCULAR | Status: AC | PRN
  Administered 2017-02-17: 4 mg via INTRAVENOUS
  Filled 2017-02-17: qty 2

## 2017-02-17 MED ORDER — IPRATROPIUM-ALBUTEROL 0.5-2.5 (3) MG/3ML IN SOLN
3.0000 mL | Freq: Once | RESPIRATORY_TRACT | Status: AC
  Administered 2017-02-17: 3 mL via RESPIRATORY_TRACT
  Filled 2017-02-17: qty 3

## 2017-02-17 MED ORDER — MOMETASONE FURO-FORMOTEROL FUM 100-5 MCG/ACT IN AERO
2.0000 | INHALATION_SPRAY | Freq: Two times a day (BID) | RESPIRATORY_TRACT | Status: DC
  Administered 2017-02-17 – 2017-02-18 (×3): 2 via RESPIRATORY_TRACT
  Filled 2017-02-17: qty 8.8

## 2017-02-17 MED ORDER — ACETAMINOPHEN 325 MG PO TABS: 650.0000 mg | ORAL_TABLET | Freq: Four times a day (QID) | ORAL | Status: DC | PRN

## 2017-02-17 MED ORDER — ACETAMINOPHEN 650 MG RE SUPP
650.0000 mg | Freq: Four times a day (QID) | RECTAL | Status: DC | PRN
Start: 2017-02-17 — End: 2017-02-19

## 2017-02-17 MED ORDER — ONDANSETRON HCL 4 MG/2ML IJ SOLN
4.0000 mg | Freq: Once | INTRAMUSCULAR | Status: AC
  Administered 2017-02-17: 4 mg via INTRAVENOUS
  Filled 2017-02-17: qty 2

## 2017-02-17 MED ORDER — LISINOPRIL 5 MG PO TABS
5.0000 mg | ORAL_TABLET | Freq: Every day | ORAL | Status: DC
  Administered 2017-02-17 – 2017-02-19 (×3): 5 mg via ORAL
  Filled 2017-02-17 (×3): qty 1

## 2017-02-17 MED ORDER — IPRATROPIUM-ALBUTEROL 0.5-2.5 (3) MG/3ML IN SOLN: 3.0000 mL | Freq: Four times a day (QID) | RESPIRATORY_TRACT | Status: DC | PRN

## 2017-02-17 MED ORDER — METOPROLOL SUCCINATE ER 25 MG PO TB24
25.0000 mg | ORAL_TABLET | Freq: Every day | ORAL | Status: DC
  Administered 2017-02-17 – 2017-02-19 (×3): 25 mg via ORAL
  Filled 2017-02-17 (×3): qty 1

## 2017-02-17 MED ORDER — ENSURE ENLIVE PO LIQD
1.0000 | Freq: Three times a day (TID) | ORAL | Status: DC
  Administered 2017-02-17 – 2017-02-18 (×2): 237 mL via ORAL

## 2017-02-17 NOTE — ED Provider Notes (Signed)
[facility] Emergency Department Provider Note ____________________________________________   I have reviewed the triage vital signs and the triage nursing note.  HISTORY  Chief Complaint Diarrhea   Historian Patient  HPI Ashley [name] is a 61 y.o. female with a history of COPD, presents after a couple of days of not feeling well, states that she just have that much energy, and then around 4 AM this morning she woke up and had bad midepigastric pain and nausea followed by multiple episodes of watery diarrhea. She states no blood or black discoloration, but some pink flecks. No definite fevers, states that she's had some chills, although she is typically cold all the time.  She has underlying COPD and states that she was using her inhaler this morning due to some shortness of breath.  No chest pain.    Past Medical History:  Diagnosis Date  . Anxiety   . COPD (chronic obstructive pulmonary disease) (HCC)   . Hypertension     Patient Active Problem List   Diagnosis Date Noted  . COPD (chronic obstructive pulmonary disease) (HCC) 06/01/2016  . HTN (hypertension) 06/01/2016  . Diarrhea 10/05/2015  . Protein-calorie malnutrition, severe 10/02/2015  . Malnutrition (HCC) 10/02/2015  . Pressure ulcer 10/01/2015  . SVT (supraventricular tachycardia) (HCC) 09/30/2015  . Pneumonia 09/30/2015  . Chest pain 09/22/2015    Past Surgical History:  Procedure Laterality Date  . ABDOMINAL HYSTERECTOMY      Prior to Admission medications   Medication Sig Start Date End Date Taking? Authorizing Provider  albuterol (PROAIR HFA) 108 (90 Base) MCG/ACT inhaler Inhale 2 puffs into the lungs every 6 (six) hours as needed for wheezing or shortness of breath. 06/02/16   Ramonita Lab, MD  cephALEXin (KEFLEX) 500 MG capsule Take 1 capsule (500 mg total) by mouth 2 (two) times daily. 02/17/17   Governor Rooks, MD  feeding supplement, ENSURE ENLIVE, (ENSURE ENLIVE) LIQD Take 237  mLs by mouth 3 (three) times daily between meals. 06/02/16   Ramonita Lab, MD  Fluticasone-Salmeterol (ADVAIR DISKUS) 500-50 MCG/DOSE AEPB Inhale 1 puff into the lungs 2 (two) times daily.     Historical Provider, MD  lisinopril (PRINIVIL,ZESTRIL) 5 MG tablet Take 5 mg by mouth daily.    Historical Provider, MD  LORazepam (ATIVAN) 1 MG tablet Take 1 tablet (1 mg total) by mouth every 8 (eight) hours as needed for anxiety. 07/07/16 07/07/17  Minna Antis, MD  tiotropium (SPIRIVA) 18 MCG inhalation capsule Place 18 mcg into inhaler and inhale daily.    Historical Provider, MD    Allergies  Allergen Reactions  . Codeine Nausea Only    Family History  Problem Relation Age of Onset  . CAD Mother   . CAD Father   . Cancer - Prostate Brother   . Cancer - Lung Brother     Social History Social History  Substance Use Topics  . Smoking status: Current Every Day Smoker    Packs/day: 0.50    Types: Cigarettes  . Smokeless tobacco: Never Used  . Alcohol use No    Review of Systems  Constitutional: Negative for fever. Eyes: Negative for visual changes. ENT: Negative for sore throat. Cardiovascular: Negative for chest pain. Respiratory: Fairly chronic shortness of breath. Gastrointestinal: Currently no abdominal pain.. Genitourinary: Negative for dysuria. Musculoskeletal: Negative for back pain. Skin: Negative for rash. Neurological: Negative for headache. 10 point Review of Systems otherwise negative ____________________________________________   PHYSICAL EXAM:  VITAL SIGNS: ED Triage Vitals [02/17/17 0616]  Enc Vitals Group     BP (!) 162/105     Pulse Rate (!) 104     Resp 20     Temp 98 F (36.7 C)     Temp Source Oral     SpO2 96 %     Weight 80 lb (36.3 kg)     Height 5\' 2"  (1.575 m)     Head Circumference      Peak Flow      Pain Score 5     Pain Loc      Pain Edu?      Excl. in GC?      Constitutional: Alert and oriented. Cachectic appearance  overall. HEENT   Head: Normocephalic and atraumatic.      Eyes: Conjunctivae are normal. PERRL. Normal extraocular movements.      Ears:         Nose: No congestion/rhinnorhea.   Mouth/Throat: Mucous membranes are Mildly dry.   Neck: No stridor. Cardiovascular/Chest: Tachycardic rate, regular rhythm.  No murmurs, rubs, or gallops. Respiratory: Normal respiratory effort occasionally with some mild retractions. Moderate end expiratory wheezing throughout all fields. No rhonchi or rales. Gastrointestinal: Soft. No distention, no guarding, no rebound. Nontender.    Genitourinary/rectal:Deferred Musculoskeletal: Nontender with normal range of motion in all extremities. No joint effusions.  No lower extremity tenderness.  No edema. Neurologic:  Normal speech and language. No gross or focal neurologic deficits are appreciated. Skin:  Skin is warm, dry and intact. No rash noted. Psychiatric: Mood and affect are normal. Speech and behavior are normal. Patient exhibits appropriate insight and judgment.   ____________________________________________  LABS (pertinent positives/negatives)  Labs Reviewed  LIPASE, BLOOD - Abnormal; Notable for the following:       Result Value   Lipase 89 (*)    All other components within normal limits  COMPREHENSIVE METABOLIC PANEL - Abnormal; Notable for the following:    Glucose, Bld 120 (*)    ALT 12 (*)    All other components within normal limits  CBC - Abnormal; Notable for the following:    WBC 12.0 (*)    All other components within normal limits  URINALYSIS, COMPLETE (UACMP) WITH MICROSCOPIC - Abnormal; Notable for the following:    Color, Urine STRAW (*)    APPearance CLEAR (*)    Hgb urine dipstick SMALL (*)    Leukocytes, UA TRACE (*)    Bacteria, UA MANY (*)    All other components within normal limits  URINE CULTURE  CULTURE, BLOOD (ROUTINE X 2)  CULTURE, BLOOD (ROUTINE X 2)  INFLUENZA PANEL BY PCR (TYPE A & B)  TROPONIN I   LACTIC ACID, PLASMA  LACTIC ACID, PLASMA    ____________________________________________    EKG I, Governor Rooks, MD, the attending physician have personally viewed and interpreted all ECGs.  91 bpm. Normal sinus rhythm. Narrow QRS. Normal axis. Nonspecific ST and T-wave ____________________________________________  RADIOLOGY All Xrays were viewed by me. Imaging interpreted by Radiologist.  Chest x-ray two-view: IMPRESSION: Stable emphysema and left upper lobe scarring.  No active disease.  Ultrasound abdomen limited: IMPRESSION: No evidence of cholelithiasis or biliary ductal dilatation.  Stable small benign hepatic hemangioma. __________________________________________  PROCEDURES  Procedure(s) performed: None  Critical Care performed: None  ____________________________________________   ED COURSE / ASSESSMENT AND PLAN  Pertinent labs & imaging results that were available during my care of the patient were reviewed by me and considered in my medical decision making (see chart for  details).   [name] is here for nausea with watery diarrhea 6 times over last 4 hours. She is tachycardic, and I suspect a little dehydrated based on the report of fluid losses. She is also having what appears to be mild COPD exacerbation.  Lipase slightly elevated. I did order a chest x-ray, no focal pneumonia infiltrate. Ultrasound reviewed, no acute findings.  I went to reevaluate the patient around 11:30, heart rate 105 up to 115, and this is after 1 L of fluid. She feels like her breathing is a little bit worse, but overall she feels like she is having body aches and she just feels like she is too weak to stand up.  At this point I am worried about potential sepsis given the persistent tachycardia, and she starting to feel worse all over.  She also has the urinary tract infection. She was given 1 dose of IV Rocephin. In terms of the possible sepsis, second liter of fluid ordered,  blood cultures ordered, and I will admit the patient to the hospitalist.    CONSULTATIONS:   Hospitalist for admission.   Patient / Family / Caregiver informed of clinical course, medical decision-making process, and agree with plan.  ___________________________________________   FINAL CLINICAL IMPRESSION(S) / ED DIAGNOSES   Final diagnoses:  Diarrhea, unspecified type  Chronic obstructive pulmonary disease, unspecified COPD type (HCC)  Urinary tract infection without hematuria, site unspecified  Sepsis, due to unspecified organism [facility](HCC)              Note: This dictation was prepared with Dragon dictation. Any transcriptional errors that result from this process are unintentional    Governor Rooksebecca Demetris Meinhardt, MD 02/17/17 1151

## 2017-02-17 NOTE — ED Triage Notes (Signed)
Pt reports that she has had diarrhea x6 since 0400 this am. Pt arrives via [facility] with VS BP 160/91, 96% O2, 98.6 temp, and 106 PR. Pt is stating that she feels like something is protruding from her rectum Pt is in NAD at this time.

## 2017-02-17 NOTE — Progress Notes (Signed)
Pharmacy Antibiotic Note  Ashley [name] is a 61 y.o. female admitted on 02/17/2017 with UTI.  Pharmacy has been consulted for Ceftriaxone  Dosing. Patient received ceftriaxone 1gm IV x 1 dose in ED.   Plan: Will start patient on Ceftriaxone 1gm IV every 24 hours.   Height: 5\' 2"  (157.5 cm) Weight: 80 lb (36.3 kg) IBW/kg (Calculated) : 50.1  Temp (24hrs), Avg:98 F (36.7 C), Min:98 F (36.7 C), Max:98 F (36.7 C)   Recent Labs Lab 02/17/17 0618 02/17/17 1200  WBC 12.0*  --   CREATININE 0.49  --   LATICACIDVEN  --  1.3    Estimated Creatinine Clearance: 41.2 mL/min (by C-G formula based on SCr of 0.49 mg/dL).    Allergies  Allergen Reactions  . Codeine Nausea Only    Antimicrobials this admission: 2/25 ceftriaxone >>   Dose adjustments this admission:  Microbiology results: 2/25  BCx: sent 2/25  UCx: sent   Thank you for allowing pharmacy to be a part of this patient's care.  Gardner CandleSheema M Cadarius Nevares, PharmD, BCPS Clinical Pharmacist 02/17/2017 2:46 PM

## 2017-02-17 NOTE — Progress Notes (Signed)
PHARMACIST - PHYSICIAN COMMUNICATION  CONCERNING:  Enoxaparin (Lovenox) for DVT Prophylaxis    RECOMMENDATION: Patient was prescribed enoxaprin 40mg  q24 hours for VTE prophylaxis.   Filed Weights   02/17/17 0616  Weight: 80 lb (36.3 kg)    Body mass index is 14.63 kg/m.  Estimated Creatinine Clearance: 41.2 mL/min (by C-G formula based on SCr of 0.49 mg/dL).   Patient is candidate for enoxaparin 30mg  every 24 hours based on  Weight less then 45kg for female and 50kg for female  DESCRIPTION: Pharmacy has adjusted enoxaparin dose per.  Patient is now receiving enoxaparin 30mg  every 24 hours.  Cher NakaiSheema Hardy Harcum, PharmD Clinical Pharmacist  02/17/2017 2:44 PM

## 2017-02-17 NOTE — Progress Notes (Signed)
Pt states she took a "Goody powder" for her headache while here in the hospital. Advised pt to not take any more home meds.

## 2017-02-17 NOTE — Progress Notes (Signed)
Pt refused TEDs and SCDs. Education provided. Pt elects to use lovenox.

## 2017-02-17 NOTE — H&P (Signed)
[facility] - [place] at [facility]   PATIENT NAME: Ashley [name]    MR#:  [id number]  DATE OF BIRTH:  01-05-1956  DATE OF ADMISSION:  02/17/2017  PRIMARY CARE PHYSICIAN: Toy Cookey, FNP   REQUESTING/REFERRING PHYSICIAN: Dr. Governor Rooks  CHIEF COMPLAINT:   Chief Complaint  Patient presents with  . Diarrhea    HISTORY OF PRESENT ILLNESS:  Ashley [name]  is a 61 y.o. female with a known history of anxiety, COPD not on home o2, Hypertension presents to the hospital secondary to chills, nausea,  lower abdominal pain and increased frequency of urination. Symptoms started yesterday, started to have lower abdominal pain associated with loose watery stools. Denies any recent use of antibiotics or recent travel. Diarrhea has resolved today. She complains of increased frequency of urination for the last 2 days especially nocturia. Denies any fevers but had chills and has been feeling significantly weak since last night. In the emergency room, she was noted to be significantly tachycardic and had an increased white count. Urine analysis showing several bacteria. She is being admitted for sepsis secondary to acute cystitis.  PAST MEDICAL HISTORY:   Past Medical History:  Diagnosis Date  . Anxiety   . COPD (chronic obstructive pulmonary disease) (HCC)   . Hypertension     PAST SURGICAL HISTORY:   Past Surgical History:  Procedure Laterality Date  . ABDOMINAL HYSTERECTOMY      SOCIAL HISTORY:   Social History  Substance Use Topics  . Smoking status: Current Every Day Smoker    Packs/day: 0.50    Types: Cigarettes  . Smokeless tobacco: Never Used  . Alcohol use No    FAMILY HISTORY:   Family History  Problem Relation Age of Onset  . CAD Mother   . CAD Father   . Cancer - Prostate Brother   . Cancer - Lung Brother     DRUG ALLERGIES:   Allergies  Allergen Reactions  . Codeine Nausea Only    REVIEW OF SYSTEMS:   Review of Systems    Constitutional: Positive for chills and malaise/fatigue. Negative for fever and weight loss.  HENT: Negative for ear discharge, ear pain, hearing loss and nosebleeds.   Eyes: Negative for blurred vision, double vision and photophobia.  Respiratory: Positive for shortness of breath. Negative for cough, hemoptysis and wheezing.   Cardiovascular: Negative for chest pain, palpitations, orthopnea and leg swelling.  Gastrointestinal: Positive for abdominal pain and diarrhea. Negative for constipation, heartburn, melena, nausea and vomiting.  Genitourinary: Positive for frequency. Negative for dysuria and urgency.  Musculoskeletal: Negative for myalgias and neck pain.  Skin: Negative for rash.  Neurological: Negative for dizziness, sensory change, speech change, focal weakness and headaches.  Endo/Heme/Allergies: Does not bruise/bleed easily.  Psychiatric/Behavioral: Negative for depression. The patient is nervous/anxious.     MEDICATIONS AT HOME:   Prior to Admission medications   Medication Sig Start Date End Date Taking? Authorizing Provider  albuterol (PROAIR HFA) 108 (90 Base) MCG/ACT inhaler Inhale 2 puffs into the lungs every 6 (six) hours as needed for wheezing or shortness of breath. 06/02/16  Yes Ramonita Lab, MD  lisinopril (PRINIVIL,ZESTRIL) 5 MG tablet Take 5 mg by mouth daily.   Yes Historical Provider, MD  metoprolol succinate (TOPROL-XL) 25 MG 24 hr tablet Take 25 mg by mouth daily.   Yes Historical Provider, MD  tiotropium (SPIRIVA) 18 MCG inhalation capsule Place 18 mcg into inhaler and inhale daily.   Yes Historical Provider, MD  cephALEXin (KEFLEX) 500 MG capsule Take 1 capsule (500 mg total) by mouth 2 (two) times daily. 02/17/17   Governor Rooksebecca Lord, MD  feeding supplement, ENSURE ENLIVE, (ENSURE ENLIVE) LIQD Take 237 mLs by mouth 3 (three) times daily between meals. 06/02/16   Ramonita LabAruna Gouru, MD  LORazepam (ATIVAN) 1 MG tablet Take 1 tablet (1 mg total) by mouth every 8 (eight) hours as  needed for anxiety. Patient not taking: Reported on 02/17/2017 07/07/16 07/07/17  Minna AntisKevin Paduchowski, MD      VITAL SIGNS:  Blood pressure 140/76, pulse 98, temperature 98 F (36.7 C), temperature source Oral, resp. rate (!) 27, height 5\' 2"  (1.575 m), weight 36.3 kg (80 lb), SpO2 91 %.  PHYSICAL EXAMINATION:   Physical Exam  GENERAL:  61 y.o.-year-old thin built patient lying in the bed with no acute distress.  EYES: Pupils equal, round, reactive to light and accommodation. No scleral icterus. Extraocular muscles intact.  HEENT: Head atraumatic, normocephalic. Oropharynx and nasopharynx clear.  NECK:  Supple, no jugular venous distention. No thyroid enlargement, no tenderness.  LUNGS: scant breath sounds bilaterally, no wheezing, rales,rhonchi or crepitation. No use of accessory muscles of respiration.  CARDIOVASCULAR: S1, S2 normal. No murmurs, rubs, or gallops.  ABDOMEN: Soft, non tender but some discomfort on supra pubic palpation, nondistended. Bowel sounds present. No organomegaly or mass.  EXTREMITIES: No pedal edema, cyanosis, or clubbing.  NEUROLOGIC: Cranial nerves II through XII are intact. Muscle strength 5/5 in all extremities. Sensation intact. Gait not checked.  PSYCHIATRIC: The patient is alert and oriented x 3.  SKIN: No obvious rash, lesion, or ulcer.   LABORATORY PANEL:   CBC  Recent Labs Lab 02/17/17 0618  WBC 12.0*  HGB 15.7  HCT 45.6  PLT 223   ------------------------------------------------------------------------------------------------------------------  Chemistries   Recent Labs Lab 02/17/17 0618  NA 142  K 3.6  CL 107  CO2 29  GLUCOSE 120*  BUN 14  CREATININE 0.49  CALCIUM 9.2  AST 23  ALT 12*  ALKPHOS 92  BILITOT 0.6   ------------------------------------------------------------------------------------------------------------------  Cardiac Enzymes  Recent Labs Lab 02/17/17 0618  TROPONINI <0.03    ------------------------------------------------------------------------------------------------------------------  RADIOLOGY:  Dg Chest 2 View  Result Date: 02/17/2017 CLINICAL DATA:  Acute onset shortness of breath today. Nausea. COPD. EXAM: CHEST  2 VIEW COMPARISON:  10/24/2016 FINDINGS: The heart size and mediastinal contours are within normal limits. Both severe hyperinflation and emphysematous changes are again seen. Pleural-parenchymal scarring in the left upper lobe with superior hilar retraction remain stable. No evidence of pulmonary infiltrate or edema. No evidence of pneumothorax or pleural effusion. IMPRESSION: Stable emphysema and left upper lobe scarring.  No active disease. Electronically Signed   By: Myles RosenthalJohn  Stahl M.D.   On: 02/17/2017 08:47   [place] Abdomen Limited Ruq  Result Date: 02/17/2017 CLINICAL DATA:  Nausea.  Elevated lipase. EXAM: US ABDOMEN LIMITED - RIGHT UPPER QUADRANT COMPARISON:  03/08/2015 FINDINGS: Gallbladder: No gallstones or wall thickening visualized. No sonographic Murphy sign noted by sonographer. Common bile duct: Diameter: 4 mm, within normal limits. Liver: Stable 8 mm hyperechoic mass in central liver is stable since previous study and consistent with a benign hemangioma. Within normal limits in parenchymal echogenicity. IMPRESSION: No evidence of cholelithiasis or biliary ductal dilatation. Stable small benign hepatic hemangioma. Electronically Signed   By: Myles RosenthalJohn  Stahl M.D.   On: 02/17/2017 09:01    EKG:   Orders placed or performed during the hospital encounter of 02/17/17  . ED EKG  . ED  EKG    IMPRESSION AND PLAN:   Ashley [name]  is a 61 y.o. female with a known history of anxiety, COPD not on home o2, Hypertension presents to the hospital secondary to chills, nausea,  lower abdominal pain and increased frequency of urination.  #1 sepsis- has tachycardia, leukocytosis -secondary to acute cystitis, blood and urine cultures ordered - started on  rocephin for now, monitor  #2 COPD- stable, not needing o2 - no indication for steroids, continue spiriva, added dulera and prn nebs  #3 hypertension- continue metoprolol and lisinopril  #4 DVT prophylaxis-on Lovenox  Physical therapy consulted for weakness    All the records are reviewed and case discussed with ED provider. Management plans discussed with the patient, family and they are in agreement.  CODE STATUS: Full code  TOTAL TIME TAKING CARE OF THIS PATIENT: 50 minutes.    Rashon Rezek M.D on 02/17/2017 at 12:18 PM  Between 7am to 6pm - Pager - [phone]  After 6pm go to [email or web address] - Social research officer, government  [facility]  Office  [phone]  CC: Primary care physician; Toy Cookey, FNP

## 2017-02-18 LAB — BASIC METABOLIC PANEL
ANION GAP: 3 — AB (ref 5–15)
BUN: 12 mg/dL (ref 6–20)
CHLORIDE: 112 mmol/L — AB (ref 101–111)
CO2: 28 mmol/L (ref 22–32)
CREATININE: 0.65 mg/dL (ref 0.44–1.00)
Calcium: 8.3 mg/dL — ABNORMAL LOW (ref 8.9–10.3)
GFR calc non Af Amer: >60 mL/min (ref >60)
Glucose, Bld: 85 mg/dL (ref 65–99)
POTASSIUM: 3.9 mmol/L (ref 3.5–5.1)
SODIUM: 143 mmol/L (ref 135–145)

## 2017-02-18 LAB — CBC
HEMATOCRIT: 35.2 % (ref 35.0–47.0)
HEMOGLOBIN: 12 g/dL (ref 12.0–16.0)
MCH: 30.7 pg (ref 26.0–34.0)
MCHC: 34 g/dL (ref 32.0–36.0)
MCV: 90.3 fL (ref 80.0–100.0)
PLATELETS: 161 10*3/uL (ref 150–440)
RBC: 3.89 MIL/uL (ref 3.80–5.20)
RDW: 13.4 % (ref 11.5–14.5)
WBC: 4.4 10*3/uL (ref 3.6–11.0)

## 2017-02-18 LAB — GASTROINTESTINAL PANEL BY PCR, STOOL (REPLACES STOOL CULTURE)
Adenovirus F40/41: NOT DETECTED
Astrovirus: NOT DETECTED
CRYPTOSPORIDIUM: NOT DETECTED
Campylobacter species: NOT DETECTED
Cyclospora cayetanensis: NOT DETECTED
ENTEROAGGREGATIVE E COLI (EAEC): NOT DETECTED
Entamoeba histolytica: NOT DETECTED
Enteropathogenic E coli (EPEC): NOT DETECTED
Enterotoxigenic E coli (ETEC): NOT DETECTED
GIARDIA LAMBLIA: NOT DETECTED
Norovirus GI/GII: NOT DETECTED
Plesimonas shigelloides: NOT DETECTED
ROTAVIRUS A: NOT DETECTED
SALMONELLA SPECIES: NOT DETECTED
SHIGELLA/ENTEROINVASIVE E COLI (EIEC): NOT DETECTED
Sapovirus (I, II, IV, and V): NOT DETECTED
Shiga like toxin producing E coli (STEC): NOT DETECTED
Vibrio cholerae: NOT DETECTED
Vibrio species: NOT DETECTED
YERSINIA ENTEROCOLITICA: NOT DETECTED

## 2017-02-18 MED ORDER — CEFUROXIME AXETIL 250 MG PO TABS
500.0000 mg | ORAL_TABLET | Freq: Two times a day (BID) | ORAL | Status: DC
  Administered 2017-02-18 – 2017-02-19 (×2): 500 mg via ORAL
  Filled 2017-02-18 (×2): qty 2

## 2017-02-18 MED ORDER — LOPERAMIDE HCL 2 MG PO CAPS
2.0000 mg | ORAL_CAPSULE | ORAL | Status: DC | PRN
  Administered 2017-02-18 – 2017-02-19 (×2): 2 mg via ORAL
  Filled 2017-02-18 (×2): qty 1

## 2017-02-18 NOTE — Evaluation (Signed)
Physical Therapy Evaluation Patient Details Name: Ashley [name] MRN: [id number] DOB: Dec 13, 1956 Today's Date: 02/18/2017   History of Present Illness  Pt admitted for sepsis. Pt with complaints of diarrhea, chills, nausea, and abdominal pain. Pt with history of anxiety, HTN, and COPD. No home O2.  Clinical Impression  Pt is a pleasant 61 year old female who was admitted for sepsis. Pt performs bed mobility with independence, transfers with mod I, and ambulation with cga and IV pole. Pt demonstrates deficits with endurance, however appears to be self limiting and close to baseline level. Discussed with pt, does not require any further acute PT needs at this time. Pt will be dc in house and does not require follow up. RN aware. Will dc current orders. Will recommend HHPT for safety evaluation and activity tolerance. Pt refuses all follow up PT services at this time. Please re-consult if needs change.      Follow Up Recommendations Home health PT (however refusing HHPT at this time)    Equipment Recommendations       Recommendations for Other Services       Precautions / Restrictions Precautions Precautions: Fall Restrictions Weight Bearing Restrictions: No      Mobility  Bed Mobility Overal bed mobility: Independent             General bed mobility comments: safe technique performed  Transfers Overall transfer level: Modified independent Equipment used:  (IV pole)             General transfer comment: safe technique performed with no dizziness noted.  Ambulation/Gait Ambulation/Gait assistance: Min guard Ambulation Distance (Feet): 40 Feet Assistive device:  (IV pole) Gait Pattern/deviations: Step-through pattern     General Gait Details: slow and cautious gait pattern, however safe technique without LOB noted. Pt requests to return back to recliner, somewhat self limiting on gait distance. Used IV pole for balance.  Stairs            Wheelchair  Mobility    Modified Rankin (Stroke Patients Only)       Balance Overall balance assessment: Needs assistance Sitting-balance support: Feet supported Sitting balance-Leahy Scale: Good     Standing balance support: Bilateral upper extremity supported Standing balance-Leahy Scale: Good                               Pertinent Vitals/Pain Pain Assessment: No/denies pain    Home Living Family/patient expects to be discharged to:: Private residence Living Arrangements: Spouse/significant other Available Help at Discharge: Family Type of Home: House Home Access: Stairs to enter Entrance Stairs-Rails: None Entrance Stairs-Number of Steps: 1 Home Layout: One level Home Equipment: Walker - 4 wheels      Prior Function Level of Independence: Independent with assistive device(s)         Comments: uses rollater inside on "bad" days. Doesn't appear to leave house often.     Hand Dominance        Extremity/Trunk Assessment   Upper Extremity Assessment Upper Extremity Assessment: Overall WFL for tasks assessed    Lower Extremity Assessment Lower Extremity Assessment: Generalized weakness (B LE grossly 4+/5)       Communication   Communication: No difficulties  Cognition Arousal/Alertness: Awake/alert Behavior During Therapy: WFL for tasks assessed/performed Overall Cognitive Status: Within Functional Limits for tasks assessed  General Comments      Exercises     Assessment/Plan    PT Assessment All further PT needs can be met in the next venue of care  PT Problem List Decreased strength;Decreased activity tolerance;Decreased balance;Decreased mobility       PT Treatment Interventions Gait training;DME instruction    PT Goals (Current goals can be found in the Care Plan section)  Acute Rehab PT Goals Patient Stated Goal: to go home PT Goal Formulation: With patient Time For Goal Achievement: 03/04/17 Potential  to Achieve Goals: Good    Frequency Min 2X/week   Barriers to discharge        Co-evaluation               End of Session   Activity Tolerance: Patient tolerated treatment well Patient left: in chair Nurse Communication: Mobility status PT Visit Diagnosis: Unsteadiness on feet (R26.81);Muscle weakness (generalized) (M62.81)         Time: 6599-3570 PT Time Calculation (min) (ACUTE ONLY): 10 min   Charges:   PT Evaluation $PT Eval Low Complexity: 1 Procedure     PT G Codes:         Hady Niemczyk Feb 28, 2017, 9:33 AM Greggory Stallion, PT, DPT [phone]

## 2017-02-18 NOTE — Progress Notes (Signed)
[facility] [place] [facility]                                                                                                                                                                                  Patient Demographics   Ashley [name], is a 61 y.o. female, DOB - 1956/04/06, ZOX:[id number]  Admit date - 02/17/2017   Admitting Physician Ashley Baasadhika Kalisetti, MD  Outpatient Primary MD for the patient is Ashley CookeyEmily Headrick, FNP   LOS - 1  Subjective: Patient continues to feel nauseous and states having diarrhea    Review of Systems:   CONSTITUTIONAL: No documented fever. No fatigue, weakness. No weight gain, no weight loss.  EYES: No blurry or double vision.  ENT: No tinnitus. No postnasal drip. No redness of the oropharynx.  RESPIRATORY: No cough, no wheeze, no hemoptysis. No dyspnea.  CARDIOVASCULAR: No chest pain. No orthopnea. No palpitations. No syncope.  GASTROINTESTINAL: Positive nausea, no vomiting or positive diarrhea. No abdominal pain. No melena or hematochezia.  GENITOURINARY: No dysuria or hematuria.  ENDOCRINE: No polyuria or nocturia. No heat or cold intolerance.  HEMATOLOGY: No anemia. No bruising. No bleeding.  INTEGUMENTARY: No rashes. No lesions.  MUSCULOSKELETAL: No arthritis. No swelling. No gout.  NEUROLOGIC: No numbness, tingling, or ataxia. No seizure-type activity.  PSYCHIATRIC: No anxiety. No insomnia. No ADD.    Vitals:   Vitals:   02/17/17 1628 02/17/17 1958 02/18/17 0430 02/18/17 0737  BP: (!) 176/69 118/63 (!) 105/52 109/77  Pulse: 70 68 61 62  Resp: 18  18 16   Temp: 98.6 F (37 C) 98.5 F (36.9 C) 98.4 F (36.9 C) 98.2 F (36.8 C)  TempSrc: Oral Oral Oral Oral  SpO2: 94% 93% 93% 95%  Weight:      Height:        Wt Readings from Last 3 Encounters:  02/17/17 80 lb (36.3 kg)  10/27/16 70 lb (31.8 kg)  10/24/16 70 lb (31.8 kg)     Intake/Output Summary (Last 24 hours) at 02/18/17 1220 Last data filed at 02/18/17  0800  Gross per 24 hour  Intake          1391.25 ml  Output                0 ml  Net          1391.25 ml    Physical Exam:   GENERAL: Pleasant-appearing in no apparent distress.  HEAD, EYES, EARS, NOSE AND THROAT: Atraumatic, normocephalic. Extraocular muscles are intact. Pupils equal and reactive to light. Sclerae anicteric. No conjunctival injection. No oro-pharyngeal erythema.  NECK: Supple. There is no jugular venous distention. No bruits, no  lymphadenopathy, no thyromegaly.  HEART: Regular rate and rhythm,. No murmurs, no rubs, no clicks.  LUNGS: Clear to auscultation bilaterally. No rales or rhonchi. No wheezes.  ABDOMEN: Soft, flat, nontender, nondistended. Has good bowel sounds. No hepatosplenomegaly appreciated.  EXTREMITIES: No evidence of any cyanosis, clubbing, or peripheral edema.  +2 pedal and radial pulses bilaterally.  NEUROLOGIC: The patient is alert, awake, and oriented x3 with no focal motor or sensory deficits appreciated bilaterally.  SKIN: Moist and warm with no rashes appreciated.  Psych: Not anxious, depressed LN: No inguinal LN enlargement    Antibiotics   Anti-infectives    Start     Dose/Rate Route Frequency Ordered Stop   02/18/17 1200  cefTRIAXone (ROCEPHIN) 1 g in dextrose 5 % 50 mL IVPB     1 g 100 mL/hr over 30 Minutes Intravenous Every 24 hours 02/17/17 1444     02/17/17 1200  cefTRIAXone (ROCEPHIN) 1 g in dextrose 5 % 50 mL IVPB  Status:  Discontinued     1 g 100 mL/hr over 30 Minutes Intravenous  Once 02/17/17 1146 02/17/17 1148   02/17/17 1200  cefTRIAXone (ROCEPHIN) IVPB 1 g     1 g 100 mL/hr over 30 Minutes Intravenous  Once 02/17/17 1148 02/17/17 1230   02/17/17 0000  cephALEXin (KEFLEX) 500 MG capsule     500 mg Oral 2 times daily 02/17/17 1146        Medications   Scheduled Meds: . cefTRIAXone (ROCEPHIN) IVPB 1 gram/50 mL D5W  1 g Intravenous Q24H  . enoxaparin (LOVENOX) injection  30 mg Subcutaneous Q24H  . feeding supplement  (ENSURE ENLIVE)  1 Bottle Oral TID BM  . lisinopril  5 mg Oral Daily  . metoprolol succinate  25 mg Oral Daily  . mometasone-formoterol  2 puff Inhalation BID  . tiotropium  18 mcg Inhalation Daily   Continuous Infusions: . sodium chloride 50 mL/hr at 02/18/17 1036   PRN Meds:.acetaminophen **OR** acetaminophen, ipratropium-albuterol, ondansetron **OR** ondansetron (ZOFRAN) IV   Data Review:   Micro Results Recent Results (from the past 240 hour(s))  Blood culture (routine x 2)     Status: None (Preliminary result)   Collection Time: 02/17/17 12:00 PM  Result Value Ref Range Status   Specimen Description BLOOD RIGHT ARM  Final   Special Requests   Final    BOTTLES DRAWN AEROBIC AND ANAEROBIC ANA12ML AER12ML   Culture NO GROWTH < 24 HOURS  Final   Report Status PENDING  Incomplete  Blood culture (routine x 2)     Status: None (Preliminary result)   Collection Time: 02/17/17 12:00 PM  Result Value Ref Range Status   Specimen Description BLOOD LEFT ARM  Final   Special Requests BOTTLES DRAWN AEROBIC AND ANAEROBIC AER14ML ANA9ML  Final   Culture NO GROWTH < 24 HOURS  Final   Report Status PENDING  Incomplete    Radiology Reports Dg Chest 2 View  Result Date: 02/17/2017 CLINICAL DATA:  Acute onset shortness of breath today. Nausea. COPD. EXAM: CHEST  2 VIEW COMPARISON:  10/24/2016 FINDINGS: The heart size and mediastinal contours are within normal limits. Both severe hyperinflation and emphysematous changes are again seen. Pleural-parenchymal scarring in the left upper lobe with superior hilar retraction remain stable. No evidence of pulmonary infiltrate or edema. No evidence of pneumothorax or pleural effusion. IMPRESSION: Stable emphysema and left upper lobe scarring.  No active disease. Electronically Signed   By: Myles Rosenthal M.D.   On: 02/17/2017 08:47  US Abdomen Limited Ruq  Result Date: 02/17/2017 CLINICAL DATA:  Nausea.  Elevated lipase. EXAM: US ABDOMEN LIMITED - RIGHT  UPPER QUADRANT COMPARISON:  03/08/2015 FINDINGS: Gallbladder: No gallstones or wall thickening visualized. No sonographic Murphy sign noted by sonographer. Common bile duct: Diameter: 4 mm, within normal limits. Liver: Stable 8 mm hyperechoic mass in central liver is stable since previous study and consistent with a benign hemangioma. Within normal limits in parenchymal echogenicity. IMPRESSION: No evidence of cholelithiasis or biliary ductal dilatation. Stable small benign hepatic hemangioma. Electronically Signed   By: Myles Rosenthal M.D.   On: 02/17/2017 09:01     CBC  Recent Labs Lab 02/17/17 0618 02/18/17 0350  WBC 12.0* 4.4  HGB 15.7 12.0  HCT 45.6 35.2  PLT 223 161  MCV 89.7 90.3  MCH 30.9 30.7  MCHC 34.4 34.0  RDW 13.6 13.4    Chemistries   Recent Labs Lab 02/17/17 0618 02/18/17 0350  NA 142 143  K 3.6 3.9  CL 107 112*  CO2 29 28  GLUCOSE 120* 85  BUN 14 12  CREATININE 0.49 0.65  CALCIUM 9.2 8.3*  AST 23  --   ALT 12*  --   ALKPHOS 92  --   BILITOT 0.6  --    ------------------------------------------------------------------------------------------------------------------ estimated creatinine clearance is 41.2 mL/min (by C-G formula based on SCr of 0.65 mg/dL). ------------------------------------------------------------------------------------------------------------------ No results for input(s): HGBA1C in the last 72 hours. ------------------------------------------------------------------------------------------------------------------ No results for input(s): CHOL, HDL, LDLCALC, TRIG, CHOLHDL, LDLDIRECT in the last 72 hours. ------------------------------------------------------------------------------------------------------------------ No results for input(s): TSH, T4TOTAL, T3FREE, THYROIDAB in the last 72 hours.  Invalid input(s): FREET3 ------------------------------------------------------------------------------------------------------------------ No  results for input(s): VITAMINB12, FOLATE, FERRITIN, TIBC, IRON, RETICCTPCT in the last 72 hours.  Coagulation profile No results for input(s): INR, PROTIME in the last 168 hours.  No results for input(s): DDIMER in the last 72 hours.  Cardiac Enzymes  Recent Labs Lab 02/17/17 0618  TROPONINI <0.03   ------------------------------------------------------------------------------------------------------------------ Invalid input(s): POCBNP    Assessment & Plan   Ashley [name]  is a 61 y.o. female with a known history of anxiety, COPD not on home o2, Hypertension presents to the hospital secondary to chills, nausea,  lower abdominal pain and increased frequency of urination.  #1 sepsis- Due to combination of cystitis and viral gastroenteritis -Continues to have symptoms - I will continue IV hydration and symptomatic therapy  #2 COPD- stable, not needing o2 - no indication for steroids, continue spiriva, added dulera and prn nebs  #3 hypertension- continue metoprolol and lisinopril  #4 DVT prophylaxis-on Lovenox  Physical therapy consulted for weakness     Code Status Orders        Start     Ordered   02/17/17 1426  Full code  Continuous     02/17/17 1425    Code Status History    Date Active Date Inactive Code Status Order ID Comments User Context   06/01/2016  2:00 AM 06/02/2016  6:06 PM Full Code [id number]  Gery Pray, MD Inpatient   09/30/2015  4:04 PM 10/05/2015  3:50 PM Full Code [id number]  Altamese Dilling, MD ED   09/22/2015  5:47 PM 09/24/2015  2:04 PM Full Code [id number]  Ashley Baas, MD Inpatient           Consults  none   DVT Prophylaxis  Lovenox    Lab Results  Component Value Date   PLT 161 02/18/2017     Time Spent in minutes   Greater than 50% of time spent in care coordination and counseling patient regarding the condition and plan of care.   Auburn Bilberry M.D on 02/18/2017 at 12:20 PM  Between 7am to 6pm -  Pager - [phone]  After 6pm go to [email or web address] - password [email or web address] [name]  [facility]   Office  [phone]

## 2017-02-18 NOTE — Progress Notes (Addendum)
Initial Nutrition Assessment  DOCUMENTATION CODES:   Severe malnutrition in context of chronic illness  INTERVENTION:  1. Ensure Enlive po TID, each supplement provides 350 kcal and 20 grams of protein  NUTRITION DIAGNOSIS:   Malnutrition related to chronic illness as evidenced by severe depletion of muscle mass, severe depletion of body fat  GOAL:   Patient will meet greater than or equal to 90% of their needs  MONITOR:   PO intake, I & O's, Labs, Weight trends, Supplement acceptance  REASON FOR ASSESSMENT:   Low Braden    ASSESSMENT:   Ashley [name]  is a 61 y.o. female with a known history of anxiety, COPD not on home o2, Hypertension presents to the hospital secondary to chills, nausea,  lower abdominal pain and increased frequency of urination  Spoke with Ashley [name] at bedside. She says "I love to eat." Reports eating Junk Food throughout the day at home. Also occassionally eats hamburger and chicken. States her weight is up from 68# to 80# over the course of last year, but continues to appear malnourished upon completion of NFPE. Exhibits severe muscle wasting, severe fat depletions and no edema. Had a hamburger, salad, potatoes,and fruit for Lunch - ate 100% Had some nausea last night but has improved some. Consumes ensure at home. Ordered here. Labs and medications reviewed   Diet Order:  Diet 2 gram sodium Room service appropriate? Yes; Fluid consistency: Thin  Skin:  Reviewed, no issues  Last BM:  02/17/2017  Height:   Ht Readings from Last 1 Encounters:  02/17/17 5\' 2"  (1.575 m)    Weight:   Wt Readings from Last 1 Encounters:  02/17/17 80 lb (36.3 kg)    Ideal Body Weight:  50 kg  BMI:  Body mass index is 14.63 kg/m.  Estimated Nutritional Needs:   Kcal:  [phone] calories  Protein:  36-43 gm  Fluid:  >/= 1L  EDUCATION NEEDS:   No education needs identified at this time  Dionne AnoWilliam M. Bahja Bence, MS, RD LDN Inpatient Clinical Dietitian Pager  [phone]

## 2017-02-18 NOTE — Care Management (Signed)
Patient active with [facility] and they assist with medications. She was last seen 2-3 months ago.  Patient refusing home health PT.  She denies any further concerns. No needs identified.

## 2017-02-19 MED ORDER — PROMETHAZINE HCL 12.5 MG PO TABS: 12.5000 mg | ORAL_TABLET | Freq: Four times a day (QID) | ORAL | 0 refills | Status: DC | PRN

## 2017-02-19 MED ORDER — PROMETHAZINE HCL 25 MG/ML IJ SOLN
25.0000 mg | Freq: Four times a day (QID) | INTRAMUSCULAR | Status: DC | PRN
  Administered 2017-02-19: 25 mg via INTRAVENOUS
  Filled 2017-02-19: qty 1

## 2017-02-19 MED ORDER — CEFUROXIME AXETIL 500 MG PO TABS: 500.0000 mg | ORAL_TABLET | Freq: Two times a day (BID) | ORAL | 0 refills | Status: AC

## 2017-02-19 NOTE — Progress Notes (Signed)
Pt being discharged home today. PIV removed. Discharge instructions reviewed with pt and all questions were answered. Prescriptions were sent to pt pharmacy for pick up. Her follow up appointment has been made. She is leaving with all her belongings, will be transported home via family.

## 2017-02-19 NOTE — Discharge Instructions (Signed)
[facility] [place] [facility] ° °DIET:  °Cardiac diet ° °DISCHARGE CONDITION:  °Stable ° °ACTIVITY:  °Activity as tolerated ° °OXYGEN:  °Home Oxygen: No. °  °Oxygen Delivery: room air ° °DISCHARGE LOCATION:  °home  ° ° °ADDITIONAL DISCHARGE INSTRUCTION: ° ° °If you experience worsening of your admission symptoms, develop shortness of breath, life threatening emergency, suicidal or homicidal thoughts you must seek medical attention immediately by calling 911 or calling your MD immediately  if symptoms less severe. ° °You Must read complete instructions/literature along with all the possible adverse reactions/side effects for all the Medicines you take and that have been prescribed to you. Take any new Medicines after you have completely understood and accpet all the possible adverse reactions/side effects.  ° °Please note ° °You were cared for by a hospitalist during your hospital stay. If you have any questions about your discharge medications or the care you received while you were in the hospital after you are discharged, you can call the unit and asked to speak with the hospitalist on call if the hospitalist that took care of you is not available. Once you are discharged, your primary care physician will handle any further medical issues. Please note that NO REFILLS for any discharge medications will be authorized once you are discharged, as it is imperative that you return to your primary care physician (or establish a relationship with a primary care physician if you do not have one) for your aftercare needs so that they can reassess your need for medications and monitor your lab values. ° ° °

## 2017-02-19 NOTE — Discharge Summary (Signed)
[facility]  Ashley [name], 61 y.o., DOB [birth year], MRN [id number]. Admission date: 02/17/2017 Discharge Date 02/19/2017 Primary MD Toy Cookey, FNP Admitting Physician Enid Baas, MD  Admission Diagnosis  Elevated lipase [R74.8] Sepsis, due to unspecified organism [facility]) [A41.9] Urinary tract infection without hematuria, site unspecified [N39.0] Diarrhea, unspecified type [R19.7] Chronic obstructive pulmonary disease, unspecified COPD type (HCC) [J44.9]  Discharge Diagnosis   Active Problems:  sepsis due to cystitis Chronic COPD without exasperation hypertension        Hospital Course  Ashley [name]  is a 61 y.o. female with a known history of anxiety, COPD not on home o2, Hypertension presents to the hospital secondary to chills, nausea,  lower abdominal pain and increased frequency of urination. Patient in the ER was noted to have tachycardia and increased WBC count. Urinalysis was positive. Patient had the flu test which was negative. She was admitted and started on IV antibiotics. Patient no longer is having tachycardia and fevers resolved. Patient doing better she has some nausea. But otherwise able to eat.           Consults  None  Significant Tests:  See full reports for all details     Dg Chest 2 View  Result Date: 02/17/2017 CLINICAL DATA:  Acute onset shortness of breath today. Nausea. COPD. EXAM: CHEST  2 VIEW COMPARISON:  10/24/2016 FINDINGS: The heart size and mediastinal contours are within normal limits. Both severe hyperinflation and emphysematous changes are again seen. Pleural-parenchymal scarring in the left upper lobe with superior hilar retraction remain stable. No evidence of pulmonary infiltrate or edema. No evidence of pneumothorax or pleural effusion. IMPRESSION: Stable emphysema and left upper lobe scarring.  No active disease. Electronically Signed   By: Myles Rosenthal M.D.   On: 02/17/2017 08:47   US  Abdomen Limited Ruq  Result Date: 02/17/2017 CLINICAL DATA:  Nausea.  Elevated lipase. EXAM: US ABDOMEN LIMITED - RIGHT UPPER QUADRANT COMPARISON:  03/08/2015 FINDINGS: Gallbladder: No gallstones or wall thickening visualized. No sonographic Murphy sign noted by sonographer. Common bile duct: Diameter: 4 mm, within normal limits. Liver: Stable 8 mm hyperechoic mass in central liver is stable since previous study and consistent with a benign hemangioma. Within normal limits in parenchymal echogenicity. IMPRESSION: No evidence of cholelithiasis or biliary ductal dilatation. Stable small benign hepatic hemangioma. Electronically Signed   By: Myles Rosenthal M.D.   On: 02/17/2017 09:01       Today   Subjective:   [name]  plains of some nausea and pain in her legs  Objective:   Blood pressure (!) 135/58, pulse 62, temperature 98.6 F (37 C), temperature source Oral, resp. rate 16, height 5\' 2"  (1.575 m), weight 80 lb (36.3 kg), SpO2 94 %.  .  Intake/Output Summary (Last 24 hours) at 02/19/17 1310 Last data filed at 02/18/17 2000  Gross per 24 hour  Intake              120 ml  Output                0 ml  Net              120 ml    Exam VITAL SIGNS: Blood pressure (!) 135/58, pulse 62, temperature 98.6 F (37 C), temperature source Oral, resp. rate 16, height 5\' 2"  (1.575 m), weight 80 lb (36.3 kg), SpO2 94 %.  GENERAL:  61 y.o.-year-old patient lying in the bed with  no acute distress.  EYES: Pupils equal, round, reactive to light and accommodation. No scleral icterus. Extraocular muscles intact.  HEENT: Head atraumatic, normocephalic. Oropharynx and nasopharynx clear.  NECK:  Supple, no jugular venous distention. No thyroid enlargement, no tenderness.  LUNGS: Normal breath sounds bilaterally, no wheezing, rales,rhonchi or crepitation. No use of accessory muscles of respiration.  CARDIOVASCULAR: S1, S2 normal. No murmurs, rubs, or gallops.  ABDOMEN: Soft, nontender, nondistended. Bowel  sounds present. No organomegaly or mass.  EXTREMITIES: No pedal edema, cyanosis, or clubbing.  NEUROLOGIC: Cranial nerves II through XII are intact. Muscle strength 5/5 in all extremities. Sensation intact. Gait not checked.  PSYCHIATRIC: The patient is alert and oriented x 3.  SKIN: No obvious rash, lesion, or ulcer.   Data Review     CBC w Diff: Lab Results  Component Value Date   WBC 4.4 02/18/2017   HGB 12.0 02/18/2017   HGB 15.6 03/17/2015   HCT 35.2 02/18/2017   HCT 47.6 (H) 03/17/2015   PLT 161 02/18/2017   PLT 272 03/17/2015   LYMPHOPCT 18 10/25/2016   LYMPHOPCT 15.9 03/17/2015   MONOPCT 6 10/25/2016   MONOPCT 8.3 03/17/2015   EOSPCT 1 10/25/2016   EOSPCT 0.6 03/17/2015   BASOPCT 1 10/25/2016   BASOPCT 1.0 03/17/2015   CMP: Lab Results  Component Value Date   NA 143 02/18/2017   NA 141 03/17/2015   K 3.9 02/18/2017   K 3.5 03/17/2015   CL 112 (H) 02/18/2017   CL 101 03/17/2015   CO2 28 02/18/2017   CO2 31 03/17/2015   BUN 12 02/18/2017   BUN 8 03/17/2015   CREATININE 0.65 02/18/2017   CREATININE 0.56 03/17/2015   PROT 6.7 02/17/2017   PROT 7.0 03/17/2015   ALBUMIN 4.2 02/17/2017   ALBUMIN 4.5 03/17/2015   BILITOT 0.6 02/17/2017   BILITOT 0.6 03/17/2015   ALKPHOS 92 02/17/2017   ALKPHOS 94 03/17/2015   AST 23 02/17/2017   AST 22 03/17/2015   ALT 12 (L) 02/17/2017   ALT 15 03/17/2015  .  Micro Results Recent Results (from the past 240 hour(s))  Urine culture     Status: Abnormal (Preliminary result)   Collection Time: 02/17/17  9:13 AM  Result Value Ref Range Status   Specimen Description URINE, RANDOM  Final   Special Requests NONE  Final   Culture 20,000 COLONIES/mL ESCHERICHIA COLI (A)  Final   Report Status PENDING  Incomplete  Blood culture (routine x 2)     Status: None (Preliminary result)   Collection Time: 02/17/17 12:00 PM  Result Value Ref Range Status   Specimen Description BLOOD RIGHT ARM  Final   Special Requests   Final     BOTTLES DRAWN AEROBIC AND ANAEROBIC ANA12ML AER12ML   Culture NO GROWTH 2 DAYS  Final   Report Status PENDING  Incomplete  Blood culture (routine x 2)     Status: None (Preliminary result)   Collection Time: 02/17/17 12:00 PM  Result Value Ref Range Status   Specimen Description BLOOD LEFT ARM  Final   Special Requests BOTTLES DRAWN AEROBIC AND ANAEROBIC AER14ML ANA9ML  Final   Culture NO GROWTH 2 DAYS  Final   Report Status PENDING  Incomplete  Gastrointestinal Panel by PCR , Stool     Status: None   Collection Time: 02/18/17 10:36 AM  Result Value Ref Range Status   Campylobacter species NOT DETECTED NOT DETECTED Final   Plesimonas shigelloides NOT DETECTED NOT DETECTED Final  Salmonella species NOT DETECTED NOT DETECTED Final   Yersinia enterocolitica NOT DETECTED NOT DETECTED Final   Vibrio species NOT DETECTED NOT DETECTED Final   Vibrio cholerae NOT DETECTED NOT DETECTED Final   Enteroaggregative E coli (EAEC) NOT DETECTED NOT DETECTED Final   Enteropathogenic E coli (EPEC) NOT DETECTED NOT DETECTED Final   Enterotoxigenic E coli (ETEC) NOT DETECTED NOT DETECTED Final   Shiga like toxin producing E coli (STEC) NOT DETECTED NOT DETECTED Final   Shigella/Enteroinvasive E coli (EIEC) NOT DETECTED NOT DETECTED Final   Cryptosporidium NOT DETECTED NOT DETECTED Final   Cyclospora cayetanensis NOT DETECTED NOT DETECTED Final   Entamoeba histolytica NOT DETECTED NOT DETECTED Final   Giardia lamblia NOT DETECTED NOT DETECTED Final   Adenovirus F40/41 NOT DETECTED NOT DETECTED Final   Astrovirus NOT DETECTED NOT DETECTED Final   Norovirus GI/GII NOT DETECTED NOT DETECTED Final   Rotavirus A NOT DETECTED NOT DETECTED Final   Sapovirus (I, II, IV, and V) NOT DETECTED NOT DETECTED Final        Code Status Orders        Start     Ordered   02/17/17 1426  Full code  Continuous     02/17/17 1425    Code Status History    Date Active Date Inactive Code Status Order ID Comments  User Context   06/01/2016  2:00 AM 06/02/2016  6:06 PM Full Code [id number]  Gery Prayebby Crosley, MD Inpatient   09/30/2015  4:04 PM 10/05/2015  3:50 PM Full Code [id number]  Altamese DillingVaibhavkumar Vachhani, MD ED   09/22/2015  5:47 PM 09/24/2015  2:04 PM Full Code [id number]  Enid Baasadhika Kalisetti, MD Inpatient          Follow-up Information    Toy CookeyEmily Headrick, FNP. Go on 03/04/2017.   Specialty:  Family Medicine Why:  Monday March 12th @ 3:20pm Contact information: [address] [place] [address] [phone]           Discharge Medications   Allergies as of 02/19/2017      Reactions   Codeine Nausea Only      Medication List    TAKE these medications   albuterol 108 (90 Base) MCG/ACT inhaler Commonly known as:  PROAIR HFA Inhale 2 puffs into the lungs every 6 (six) hours as needed for wheezing or shortness of breath.   cefUROXime 500 MG tablet Commonly known as:  CEFTIN Take 1 tablet (500 mg total) by mouth 2 (two) times daily with a meal.   feeding supplement (ENSURE ENLIVE) Liqd Take 237 mLs by mouth 3 (three) times daily between meals.   lisinopril 5 MG tablet Commonly known as:  PRINIVIL,ZESTRIL Take 5 mg by mouth daily.   LORazepam 1 MG tablet Commonly known as:  ATIVAN Take 1 tablet (1 mg total) by mouth every 8 (eight) hours as needed for anxiety.   metoprolol succinate 25 MG 24 hr tablet Commonly known as:  TOPROL-XL Take 25 mg by mouth daily.   promethazine 12.5 MG tablet Commonly known as:  PHENERGAN Take 1 tablet (12.5 mg total) by mouth every 6 (six) hours as needed for nausea or vomiting.   tiotropium 18 MCG inhalation capsule Commonly known as:  SPIRIVA Place 18 mcg into inhaler and inhale daily.          Total Time in preparing paper work, data evaluation and todays exam - 35 minutes  Auburn BilberryPATEL, Jerardo Costabile M.D on 02/19/2017 at 1:10 PM  [facility]   Office  [phone] 

## 2017-02-20 LAB — URINE CULTURE

## 2017-02-22 LAB — CULTURE, BLOOD (ROUTINE X 2)
CULTURE: NO GROWTH
CULTURE: NO GROWTH

## 2017-07-08 ENCOUNTER — Inpatient Hospital Stay
Admission: EM | Admit: 2017-07-08 | Discharge: 2017-07-10 | DRG: 190 | Disposition: A | Discharge status: Home / Self Care | Payer: Self-pay | Attending: Internal Medicine | Admitting: Internal Medicine

## 2017-07-08 ENCOUNTER — Emergency Department: Payer: Self-pay

## 2017-07-08 DIAGNOSIS — Z801 Family history of malignant neoplasm of trachea, bronchus and lung: Secondary | ICD-10-CM

## 2017-07-08 DIAGNOSIS — Z8042 Family history of malignant neoplasm of prostate: Secondary | ICD-10-CM

## 2017-07-08 DIAGNOSIS — J9601 Acute respiratory failure with hypoxia: Secondary | ICD-10-CM | POA: Diagnosis present

## 2017-07-08 DIAGNOSIS — F17210 Nicotine dependence, cigarettes, uncomplicated: Secondary | ICD-10-CM | POA: Diagnosis present

## 2017-07-08 DIAGNOSIS — I10 Essential (primary) hypertension: Secondary | ICD-10-CM | POA: Diagnosis present

## 2017-07-08 DIAGNOSIS — R55 Syncope and collapse: Secondary | ICD-10-CM

## 2017-07-08 DIAGNOSIS — Z90710 Acquired absence of both cervix and uterus: Secondary | ICD-10-CM

## 2017-07-08 DIAGNOSIS — Z885 Allergy status to narcotic agent status: Secondary | ICD-10-CM

## 2017-07-08 DIAGNOSIS — J441 Chronic obstructive pulmonary disease with (acute) exacerbation: Principal | ICD-10-CM | POA: Diagnosis present

## 2017-07-08 DIAGNOSIS — Z8249 Family history of ischemic heart disease and other diseases of the circulatory system: Secondary | ICD-10-CM

## 2017-07-08 LAB — HEPATIC FUNCTION PANEL
ALBUMIN: 4.2 g/dL (ref 3.5–5.0)
ALK PHOS: 87 U/L (ref 38–126)
ALT: 9 U/L — ABNORMAL LOW (ref 14–54)
AST: 27 U/L (ref 15–41)
BILIRUBIN DIRECT: 0.3 mg/dL (ref 0.1–0.5)
BILIRUBIN INDIRECT: 1.1 mg/dL — AB (ref 0.3–0.9)
BILIRUBIN TOTAL: 1.4 mg/dL — AB (ref 0.3–1.2)
Total Protein: 7 g/dL (ref 6.5–8.1)

## 2017-07-08 LAB — CBC WITH DIFFERENTIAL/PLATELET
BASOS ABS: 0.1 10*3/uL (ref 0–0.1)
Basophils Relative: 1 %
EOS PCT: 0 %
Eosinophils Absolute: 0 10*3/uL (ref 0–0.7)
HCT: 47.4 % — ABNORMAL HIGH (ref 35.0–47.0)
Hemoglobin: 16.2 g/dL — ABNORMAL HIGH (ref 12.0–16.0)
LYMPHS PCT: 12 %
Lymphs Abs: 1.5 10*3/uL (ref 1.0–3.6)
MCH: 29.7 pg (ref 26.0–34.0)
MCHC: 34.1 g/dL (ref 32.0–36.0)
MCV: 87 fL (ref 80.0–100.0)
MONO ABS: 0.5 10*3/uL (ref 0.2–0.9)
Monocytes Relative: 4 %
Neutro Abs: 10.2 10*3/uL — ABNORMAL HIGH (ref 1.4–6.5)
Neutrophils Relative %: 83 %
PLATELETS: 246 10*3/uL (ref 150–440)
RBC: 5.45 MIL/uL — ABNORMAL HIGH (ref 3.80–5.20)
RDW: 13.4 % (ref 11.5–14.5)
WBC: 12.2 10*3/uL — ABNORMAL HIGH (ref 3.6–11.0)

## 2017-07-08 LAB — BASIC METABOLIC PANEL
ANION GAP: 11 (ref 5–15)
BUN: 14 mg/dL (ref 6–20)
CO2: 26 mmol/L (ref 22–32)
Calcium: 9.7 mg/dL (ref 8.9–10.3)
Chloride: 103 mmol/L (ref 101–111)
Creatinine, Ser: 0.56 mg/dL (ref 0.44–1.00)
GFR calc non Af Amer: >60 mL/min (ref >60)
GLUCOSE: 100 mg/dL — AB (ref 65–99)
POTASSIUM: 4.3 mmol/L (ref 3.5–5.1)
Sodium: 140 mmol/L (ref 135–145)

## 2017-07-08 LAB — TROPONIN I

## 2017-07-08 LAB — LIPASE, BLOOD: Lipase: 20 U/L (ref 11–51)

## 2017-07-08 MED ORDER — DOCUSATE SODIUM 100 MG PO CAPS: 100.0000 mg | ORAL_CAPSULE | Freq: Two times a day (BID) | ORAL | Status: DC | PRN

## 2017-07-08 MED ORDER — IPRATROPIUM-ALBUTEROL 0.5-2.5 (3) MG/3ML IN SOLN
3.0000 mL | Freq: Once | RESPIRATORY_TRACT | Status: AC
Start: 2017-07-08 — End: 2017-07-08
  Administered 2017-07-08: 3 mL via RESPIRATORY_TRACT
  Filled 2017-07-08: qty 3

## 2017-07-08 MED ORDER — MAGNESIUM SULFATE 2 GM/50ML IV SOLN
2.0000 g | Freq: Once | INTRAVENOUS | Status: AC
Start: 2017-07-08 — End: 2017-07-08
  Administered 2017-07-08: 2 g via INTRAVENOUS
  Filled 2017-07-08: qty 50

## 2017-07-08 MED ORDER — METOPROLOL SUCCINATE ER 25 MG PO TB24
25.0000 mg | ORAL_TABLET | Freq: Every day | ORAL | Status: DC
  Filled 2017-07-08: qty 1

## 2017-07-08 MED ORDER — HEPARIN SODIUM (PORCINE) 5000 UNIT/ML IJ SOLN
5000.0000 [IU] | Freq: Three times a day (TID) | INTRAMUSCULAR | Status: DC
  Administered 2017-07-08 – 2017-07-10 (×5): 5000 [IU] via SUBCUTANEOUS
  Filled 2017-07-08 (×5): qty 1

## 2017-07-08 MED ORDER — LISINOPRIL 5 MG PO TABS
5.0000 mg | ORAL_TABLET | Freq: Every day | ORAL | Status: DC
  Administered 2017-07-09 – 2017-07-10 (×2): 5 mg via ORAL
  Filled 2017-07-08 (×2): qty 1

## 2017-07-08 MED ORDER — PROMETHAZINE HCL 25 MG PO TABS
12.5000 mg | ORAL_TABLET | Freq: Four times a day (QID) | ORAL | Status: DC | PRN
  Filled 2017-07-08: qty 1

## 2017-07-08 MED ORDER — TIOTROPIUM BROMIDE MONOHYDRATE 18 MCG IN CAPS: 18.0000 ug | ORAL_CAPSULE | Freq: Every day | RESPIRATORY_TRACT | Status: DC

## 2017-07-08 MED ORDER — IPRATROPIUM-ALBUTEROL 0.5-2.5 (3) MG/3ML IN SOLN
3.0000 mL | Freq: Once | RESPIRATORY_TRACT | Status: AC
  Administered 2017-07-08: 3 mL via RESPIRATORY_TRACT
  Filled 2017-07-08: qty 3

## 2017-07-08 MED ORDER — LEVOFLOXACIN 500 MG PO TABS
500.0000 mg | ORAL_TABLET | Freq: Every day | ORAL | Status: DC
  Administered 2017-07-08: 500 mg via ORAL
  Filled 2017-07-08: qty 1

## 2017-07-08 MED ORDER — METHYLPREDNISOLONE SODIUM SUCC 125 MG IJ SOLR
60.0000 mg | Freq: Four times a day (QID) | INTRAMUSCULAR | Status: DC
  Administered 2017-07-08 – 2017-07-09 (×3): 60 mg via INTRAVENOUS
  Filled 2017-07-08 (×3): qty 2

## 2017-07-08 MED ORDER — ONDANSETRON HCL 4 MG/2ML IJ SOLN
4.0000 mg | Freq: Once | INTRAMUSCULAR | Status: AC
  Administered 2017-07-08: 4 mg via INTRAVENOUS
  Filled 2017-07-08: qty 2

## 2017-07-08 MED ORDER — ENSURE ENLIVE PO LIQD
1.0000 | Freq: Three times a day (TID) | ORAL | Status: DC
  Administered 2017-07-09 – 2017-07-10 (×3): 237 mL via ORAL

## 2017-07-08 MED ORDER — BUDESONIDE 0.25 MG/2ML IN SUSP
0.2500 mg | Freq: Two times a day (BID) | RESPIRATORY_TRACT | Status: DC
  Administered 2017-07-08 – 2017-07-10 (×4): 0.25 mg via RESPIRATORY_TRACT
  Filled 2017-07-08 (×4): qty 2

## 2017-07-08 MED ORDER — SODIUM CHLORIDE 0.9 % IV BOLUS (SEPSIS)
1000.0000 mL | Freq: Once | INTRAVENOUS | Status: AC
  Administered 2017-07-08: 1000 mL via INTRAVENOUS

## 2017-07-08 MED ORDER — METHYLPREDNISOLONE SODIUM SUCC 125 MG IJ SOLR
125.0000 mg | Freq: Once | INTRAMUSCULAR | Status: AC
  Administered 2017-07-08: 125 mg via INTRAVENOUS
  Filled 2017-07-08: qty 2

## 2017-07-08 MED ORDER — TIOTROPIUM BROMIDE MONOHYDRATE 18 MCG IN CAPS
18.0000 ug | ORAL_CAPSULE | Freq: Every morning | RESPIRATORY_TRACT | Status: DC
  Administered 2017-07-09 – 2017-07-10 (×2): 18 ug via RESPIRATORY_TRACT
  Filled 2017-07-08: qty 5

## 2017-07-08 MED ORDER — IPRATROPIUM-ALBUTEROL 0.5-2.5 (3) MG/3ML IN SOLN
3.0000 mL | RESPIRATORY_TRACT | Status: DC
  Administered 2017-07-08 – 2017-07-10 (×9): 3 mL via RESPIRATORY_TRACT
  Filled 2017-07-08 (×11): qty 3

## 2017-07-08 NOTE — ED Notes (Signed)
Pt ambulated to toilet without difficulty. When pt sat back in bed c/o of not feeling well. Oxygen on RA was 85%, placed on nasal cannula and will continue to monitor.

## 2017-07-08 NOTE — ED Notes (Signed)
Pt got upt ot toilet to void large amount. Her pulse ox on room air is about 88%, so I placed 2 liters on patient and ox came up to 95%.  Monitor sinus at 101.

## 2017-07-08 NOTE — ED Notes (Signed)
Pt ambulatory to toilet with 1 assist.  

## 2017-07-08 NOTE — H&P (Signed)
[facility] [place] at [facility]   PATIENT NAME: Ashley [name]    MR#:  [id number]  DATE OF BIRTH:  23-Nov-1956  DATE OF ADMISSION:  07/08/2017  PRIMARY CARE PHYSICIAN: Toy Cookey, FNP   REQUESTING/REFERRING PHYSICIAN: Rifenbark  CHIEF COMPLAINT:   Chief Complaint  Patient presents with  . Shortness of Breath  . Nausea    HISTORY OF PRESENT ILLNESS: Ashley [name]  is a 61 y.o. female with a known history of Anxiety, COPD, hypertension- not on oxygen at home but her nebulizer and inhalers.  For last 2-3 days she is feeling increasingly short of breath and has some cough. Which is significantly worse today morning that she feels short of breath and cannot walk even a few steps so decided to come to emergency room. He denies any cough or chills. She denies any chest tightness. In ER she is noted to be hypoxic up to 80s on room air and started on supplemental oxygen and in spite of giving nebulizers repeatedly she continued to be hypoxic so given to hospitalist team for further management.  PAST MEDICAL HISTORY:   Past Medical History:  Diagnosis Date  . Anxiety   . COPD (chronic obstructive pulmonary disease) (HCC)   . Hypertension     PAST SURGICAL HISTORY: Past Surgical History:  Procedure Laterality Date  . ABDOMINAL HYSTERECTOMY      SOCIAL HISTORY:  Social History  Substance Use Topics  . Smoking status: Current Every Day Smoker    Packs/day: 0.50    Types: Cigarettes  . Smokeless tobacco: Never Used  . Alcohol use No    FAMILY HISTORY:  Family History  Problem Relation Age of Onset  . CAD Mother   . CAD Father   . Cancer - Prostate Brother   . Cancer - Lung Brother     DRUG ALLERGIES:  Allergies  Allergen Reactions  . Codeine Nausea Only    REVIEW OF SYSTEMS:   CONSTITUTIONAL: No fever, fatigue or weakness.  EYES: No blurred or double vision.  EARS, NOSE, AND THROAT: No tinnitus or ear pain.  RESPIRATORY: No cough,Positive for  shortness of breath, wheezing , no hemoptysis.  CARDIOVASCULAR: No chest pain, orthopnea, edema.  GASTROINTESTINAL: No nausea, vomiting, diarrhea or abdominal pain.  GENITOURINARY: No dysuria, hematuria.  ENDOCRINE: No polyuria, nocturia,  HEMATOLOGY: No anemia, easy bruising or bleeding SKIN: No rash or lesion. MUSCULOSKELETAL: No joint pain or arthritis.   NEUROLOGIC: No tingling, numbness, weakness.  PSYCHIATRY: No anxiety or depression.   MEDICATIONS AT HOME:  Prior to Admission medications   Medication Sig Start Date End Date Taking? Authorizing Provider  albuterol (PROVENTIL) (5 MG/ML) 0.5% nebulizer solution Take 2.5 mg by nebulization every 4 (four) hours as needed for wheezing or shortness of breath.   Yes [provider]  tiotropium (SPIRIVA) 18 MCG inhalation capsule Place 18 mcg into inhaler and inhale daily.   Yes [provider]  albuterol (PROAIR HFA) 108 (90 Base) MCG/ACT inhaler Inhale 2 puffs into the lungs every 6 (six) hours as needed for wheezing or shortness of breath. 06/02/16   Gouru, Deanna Artis, MD  feeding supplement, ENSURE ENLIVE, (ENSURE ENLIVE) LIQD Take 237 mLs by mouth 3 (three) times daily between meals. 06/02/16   Gouru, Deanna Artis, MD  lisinopril (PRINIVIL,ZESTRIL) 5 MG tablet Take 5 mg by mouth daily.    [provider]  metoprolol succinate (TOPROL-XL) 25 MG 24 hr tablet Take 25 mg by mouth daily.    [provider]  promethazine (PHENERGAN) 12.5 MG tablet Take 1 tablet (12.5 mg total) by mouth every 6 (six) hours as needed for nausea or vomiting. 02/19/17   Auburn BilberryPatel, Shreyang, MD      PHYSICAL EXAMINATION:   VITAL SIGNS: Blood pressure (!) 154/88, pulse (!) 107, temperature 98.2 F (36.8 C), temperature source Oral, resp. rate (!) 22, SpO2 90 %.  GENERAL:  61 y.o.-year-old thin patient lying in the bed with no acute distress.  EYES: Pupils equal, round, reactive to light and accommodation. No scleral icterus. Extraocular muscles  intact.  HEENT: Head atraumatic, normocephalic. Oropharynx and nasopharynx clear.  NECK:  Supple, no jugular venous distention. No thyroid enlargement, no tenderness.  LUNGS: Normal breath sounds bilaterally, bilateral wheezing, no crepitation. Positive for use of accessory muscles of respiration. On nasal canula oxygen. CARDIOVASCULAR: S1, S2 normal. No murmurs, rubs, or gallops.  ABDOMEN: Soft, nontender, nondistended. Bowel sounds present. No organomegaly or mass.  EXTREMITIES: No pedal edema, cyanosis, or clubbing.  NEUROLOGIC: Cranial nerves II through XII are intact. Muscle strength 5/5 in all extremities. Sensation intact. Gait not checked.  PSYCHIATRIC: The patient is alert and oriented x 3.  SKIN: No obvious rash, lesion, or ulcer.   LABORATORY PANEL:   CBC  Recent Labs Lab 07/08/17 1243  WBC 12.2*  HGB 16.2*  HCT 47.4*  PLT 246  MCV 87.0  MCH 29.7  MCHC 34.1  RDW 13.4  LYMPHSABS 1.5  MONOABS 0.5  EOSABS 0.0  BASOSABS 0.1   ------------------------------------------------------------------------------------------------------------------  Chemistries   Recent Labs Lab 07/08/17 1243  NA 140  K 4.3  CL 103  CO2 26  GLUCOSE 100*  BUN 14  CREATININE 0.56  CALCIUM 9.7  AST 27  ALT 9*  ALKPHOS 87  BILITOT 1.4*   ------------------------------------------------------------------------------------------------------------------ CrCl cannot be calculated (Unknown ideal weight.). ------------------------------------------------------------------------------------------------------------------ No results for input(s): TSH, T4TOTAL, T3FREE, THYROIDAB in the last 72 hours.  Invalid input(s): FREET3   Coagulation profile No results for input(s): INR, PROTIME in the last 168 hours. ------------------------------------------------------------------------------------------------------------------- No results for input(s): DDIMER in the last 72  hours. -------------------------------------------------------------------------------------------------------------------  Cardiac Enzymes  Recent Labs Lab 07/08/17 1243  TROPONINI <0.03   ------------------------------------------------------------------------------------------------------------------ Invalid input(s): POCBNP  ---------------------------------------------------------------------------------------------------------------  Urinalysis    Component Value Date/Time   COLORURINE STRAW (A) 02/17/2017 0913   APPEARANCEUR CLEAR (A) 02/17/2017 0913   APPEARANCEUR Clear 12/04/2013 1306   LABSPEC 1.005 02/17/2017 0913   LABSPEC 1.004 12/04/2013 1306   PHURINE 6.0 02/17/2017 0913   GLUCOSEU NEGATIVE 02/17/2017 0913   GLUCOSEU Negative 12/04/2013 1306   HGBUR SMALL (A) 02/17/2017 0913   BILIRUBINUR NEGATIVE 02/17/2017 0913   BILIRUBINUR Negative 12/04/2013 1306   KETONESUR NEGATIVE 02/17/2017 0913   PROTEINUR NEGATIVE 02/17/2017 0913   NITRITE NEGATIVE 02/17/2017 0913   LEUKOCYTESUR TRACE (A) 02/17/2017 0913   LEUKOCYTESUR Trace 12/04/2013 1306     RADIOLOGY: Dg Chest Port 1 View  Result Date: 07/08/2017 CLINICAL DATA:  Shortness of Breath EXAM: PORTABLE CHEST 1 VIEW COMPARISON:  February 17, 2017. FINDINGS: Lungs remain hyperexpanded with scarring in the apical regions. There is no edema or consolidation. The heart size and pulmonary vascularity are normal. No adenopathy. There is aortic atherosclerosis. No evident bone lesions. IMPRESSION: Scarring in the upper lung zones with generalized hyperexpansion. No edema or consolidation. Cardiac silhouette within normal limits. There is aortic atherosclerosis. Aortic Atherosclerosis (ICD10-I70.0). Electronically Signed   By: Bretta BangWilliam  Woodruff III M.D.   On: 07/08/2017 13:08    EKG: Orders placed  or performed during the hospital encounter of 07/08/17  . ED EKG  . ED EKG  . EKG 12-Lead  . EKG 12-Lead    IMPRESSION AND  PLAN:  * Acute COPD exacerbation   IV steroid and nebulizer therapy    Spiriva, antibiotics.   Supplemental oxygens for now.  * Acute respiratory failure with hypoxia   This is due to COPD exacerbation, nasal cannula oxygen for now.  Evaluate her for need of home oxygen at the time of discharge.  * Hypertension    continue lisinopril and metoprolol.   * Active smoking   counseled to quit smoking for 4 minutes and offered nicotine patch.   All the records are reviewed and case discussed with ED provider. Management plans discussed with the patient, family and they are in agreement.  CODE STATUS:full code.  Code Status History    Date Active Date Inactive Code Status Order ID Comments User Context   02/17/2017  2:25 PM 02/19/2017  4:29 PM Full Code [id number]  Enid Baas, MD Inpatient   06/01/2016  2:00 AM 06/02/2016  6:06 PM Full Code [id number]  Gery Pray, MD Inpatient   09/30/2015  4:04 PM 10/05/2015  3:50 PM Full Code [id number]  Altamese Dilling, MD ED   09/22/2015  5:47 PM 09/24/2015  2:04 PM Full Code [id number]  Enid Baas, MD Inpatient       TOTAL TIME TAKING CARE OF THIS PATIENT50 minutes.    Altamese Dilling M.D on 07/08/2017   Between 7am to 6pm - Pager - [phone]  After 6pm go to [email or web address] - password [email or web address]  [facility]  Office  [phone]  CC: Primary care physician; Toy Cookey, FNP   Note: This dictation was prepared with Dragon dictation along with smaller phrase technology. Any transcriptional errors that result from this process are unintentional.

## 2017-07-08 NOTE — ED Provider Notes (Signed)
[facility] Emergency Department Provider Note  ____________________________________________   First MD Initiated Contact with Patient 07/08/17 1244     (approximate)  I have reviewed the triage vital signs and the nursing notes.   HISTORY  Chief Complaint Shortness of Breath and Nausea    HPI Ashley [name] is a 61 y.o. female who comes to the emergency department via EMS after a near syncopal episode. She was sitting in bed reading a book when she suddenly felt severe nausea and lightheadedness and felt that her vision went somewhat black. She laid down which helped the symptoms passed. When EMS arrived she was saturating 85% on room air although perked up with nasal cannula. She does have a long-standing history of COPD and reports worsening shortness of breath ever since coming to the hospital. She's never had a DVT or pulmonary embolism. He does report increasing sputum. Her shortness of breath is significantly worse when attempting to ambulate and improved with rest.   Past Medical History:  Diagnosis Date  . Anxiety   . COPD (chronic obstructive pulmonary disease) (HCC)   . Hypertension     Patient Active Problem List   Diagnosis Date Noted  . COPD with acute exacerbation (HCC) 07/08/2017  . COPD exacerbation (HCC) 07/08/2017  . Sepsis (HCC) 02/17/2017  . COPD (chronic obstructive pulmonary disease) (HCC) 06/01/2016  . HTN (hypertension) 06/01/2016  . Diarrhea 10/05/2015  . Protein-calorie malnutrition, severe 10/02/2015  . Malnutrition (HCC) 10/02/2015  . Pressure ulcer 10/01/2015  . SVT (supraventricular tachycardia) (HCC) 09/30/2015  . Pneumonia 09/30/2015  . Chest pain 09/22/2015    Past Surgical History:  Procedure Laterality Date  . ABDOMINAL HYSTERECTOMY      Prior to Admission medications   Medication Sig Start Date End Date Taking? Authorizing Provider  albuterol (PROVENTIL) (5 MG/ML) 0.5% nebulizer solution Take 2.5 mg by  nebulization every 4 (four) hours as needed for wheezing or shortness of breath.   Yes [provider]  tiotropium (SPIRIVA) 18 MCG inhalation capsule Place 18 mcg into inhaler and inhale daily.   Yes [provider]  albuterol (PROAIR HFA) 108 (90 Base) MCG/ACT inhaler Inhale 2 puffs into the lungs every 6 (six) hours as needed for wheezing or shortness of breath. 06/02/16   Gouru, Deanna Artis, MD  feeding supplement, ENSURE ENLIVE, (ENSURE ENLIVE) LIQD Take 237 mLs by mouth 3 (three) times daily between meals. 06/02/16   Gouru, Deanna Artis, MD  lisinopril (PRINIVIL,ZESTRIL) 5 MG tablet Take 5 mg by mouth daily.    [provider]  metoprolol succinate (TOPROL-XL) 25 MG 24 hr tablet Take 25 mg by mouth daily.    [provider]  promethazine (PHENERGAN) 12.5 MG tablet Take 1 tablet (12.5 mg total) by mouth every 6 (six) hours as needed for nausea or vomiting. 02/19/17   Auburn Bilberry, MD    Allergies Codeine  Family History  Problem Relation Age of Onset  . CAD Mother   . CAD Father   . Cancer - Prostate Brother   . Cancer - Lung Brother     Social History Social History  Substance Use Topics  . Smoking status: Current Every Day Smoker    Packs/day: 0.50    Types: Cigarettes  . Smokeless tobacco: Never Used  . Alcohol use No    Review of Systems Constitutional: No fever/chills Eyes: Positive visual changes. ENT: No sore throat. Cardiovascular: Positive chest pain. Respiratory: Positive shortness of breath. Gastrointestinal: No abdominal pain.  Positive  nausea, no vomiting.  No diarrhea.  No constipation. Genitourinary: Negative for dysuria. Musculoskeletal: Negative for back pain. Skin: Negative for rash. Neurological: Negative for headaches, focal weakness or numbness.   ____________________________________________   PHYSICAL EXAM:  VITAL SIGNS: ED Triage Vitals [07/08/17 1246]  Enc Vitals Group     BP      Pulse Rate (!) 117     Resp (!)  26     Temp 98.2 F (36.8 C)     Temp Source Oral     SpO2 94 %     Weight      Height      Head Circumference      Peak Flow      Pain Score 0     Pain Loc      Pain Edu?      Excl. in GC?     Constitutional: Alert and oriented 4 appears quite short of breath and uncomfortable Eyes: PERRL EOMI. Head: Atraumatic. Nose: No congestion/rhinnorhea. Mouth/Throat: No trismus Neck: No stridor.   Cardiovascular: Tachycardic rate, regular rhythm. Grossly normal heart sounds.  Good peripheral circulation. Respiratory: Increased respiratory effort with diffuse wheezes throughout Gastrointestinal: Soft nontender Musculoskeletal: No lower extremity edema   Neurologic:  Normal speech and language. No gross focal neurologic deficits are appreciated. Skin:  Skin is warm, dry and intact. No rash noted. Psychiatric: Mood and affect are normal. Speech and behavior are normal.    ____________________________________________   DIFFERENTIAL includes but not limited to  Cardiogenic syncope, vasovagal syncope, near-syncope, COPD exacerbation, pneumothorax, bleb, pulmonary bruising ____________________________________________   LABS (all labs ordered are listed, but only abnormal results are displayed)  Labs Reviewed  BASIC METABOLIC PANEL - Abnormal; Notable for the following:       Result Value   Glucose, Bld 100 (*)    All other components within normal limits  HEPATIC FUNCTION PANEL - Abnormal; Notable for the following:    ALT 9 (*)    Total Bilirubin 1.4 (*)    Indirect Bilirubin 1.1 (*)    All other components within normal limits  CBC WITH DIFFERENTIAL/PLATELET - Abnormal; Notable for the following:    WBC 12.2 (*)    RBC 5.45 (*)    Hemoglobin 16.2 (*)    HCT 47.4 (*)    Neutro Abs 10.2 (*)    All other components within normal limits  BASIC METABOLIC PANEL - Abnormal; Notable for the following:    Glucose, Bld 156 (*)    All other components within normal limits    URINALYSIS, ROUTINE W REFLEX MICROSCOPIC - Abnormal; Notable for the following:    Color, Urine YELLOW (*)    APPearance CLEAR (*)    All other components within normal limits  TROPONIN I  LIPASE, BLOOD  CBC    No signs of acute ischemia __________________________________________  EKG ED ECG REPORT I, Merrily BrittleNeil Hiroshi Krummel, the attending physician, personally viewed and interpreted this ECG.  Date: 07/08/2017 Rate: 121 Rhythm: Sinus tachycardia QRS Axis: normal Intervals: normal ST/T Wave abnormalities: normal Narrative Interpretation: Slightly peaked T waves anteriorly and inferiorly no frank ST elevation abnormal EKG  ____________________________________________  RADIOLOGY  Chest x-ray with no pneumothorax or pneumonia but does show chronic COPD changes____________________________________________   PROCEDURES  Procedure(s) performed: no  Procedures  Critical Care performed: no  Observation: no ____________________________________________   INITIAL IMPRESSION / ASSESSMENT AND PLAN / ED COURSE  Pertinent labs & imaging results that were available during my care of the patient  were reviewed by me and considered in my medical decision making (see chart for details).  On arrival the patient is clearly short of breath with wheezy lungs. She has 2 issues going on first is her syncope which sounds rather vasovagal and noncardiogenic. Secondarily she also has a clear COPD exacerbation requiring supplemental oxygen. I will give her steroids and multiple doses of breathing treatments and reevaluate.     ----------------------------------------- 3:17 PM on 07/08/2017 -----------------------------------------  After 3 breathing treatments and 125 mg of Solu-Medrol the patient is still short of breath tachycardic and saturating 85% on room air. This point she requires inpatient admission for continued respiratory care. ____________________________________________   FINAL  CLINICAL IMPRESSION(S) / ED DIAGNOSES  Final diagnoses:  COPD exacerbation (HCC)  Syncope, unspecified syncope type      NEW MEDICATIONS STARTED DURING THIS VISIT:  Current Discharge Medication List       Note:  This document was prepared using Dragon voice recognition software and may include unintentional dictation errors.     Merrily Brittle, MD 07/09/17 [phone]

## 2017-07-08 NOTE — ED Triage Notes (Signed)
Pt presents to ED from home via [facility] for nausea and feeling of passing out. Denies actually passing out. States she was seeing black spots. States she feels SOB as well. FD had 85% on RA. Placed on 15 L and came to 99%. Hx COPD. States COPD flare ups start like this. Pt appears anxious upon arrival. Tachy with EMS 110's. BP 150/84, CBG 70. Arrives with L AC IV.

## 2017-07-09 LAB — URINALYSIS, ROUTINE W REFLEX MICROSCOPIC
Bilirubin Urine: NEGATIVE
Glucose, UA: NEGATIVE mg/dL
Hgb urine dipstick: NEGATIVE
Ketones, ur: NEGATIVE mg/dL
Leukocytes, UA: NEGATIVE
Nitrite: NEGATIVE
Protein, ur: NEGATIVE mg/dL
Specific Gravity, Urine: 1.018 (ref 1.005–1.030)
pH: 5 (ref 5.0–8.0)

## 2017-07-09 LAB — CBC
HCT: 39.3 % (ref 35.0–47.0)
HEMOGLOBIN: 13.5 g/dL (ref 12.0–16.0)
MCH: 30.8 pg (ref 26.0–34.0)
MCHC: 34.4 g/dL (ref 32.0–36.0)
MCV: 89.7 fL (ref 80.0–100.0)
Platelets: 176 10*3/uL (ref 150–440)
RBC: 4.39 MIL/uL (ref 3.80–5.20)
RDW: 13.3 % (ref 11.5–14.5)
WBC: 5.2 10*3/uL (ref 3.6–11.0)

## 2017-07-09 LAB — BASIC METABOLIC PANEL
ANION GAP: 7 (ref 5–15)
BUN: 13 mg/dL (ref 6–20)
CHLORIDE: 106 mmol/L (ref 101–111)
CO2: 28 mmol/L (ref 22–32)
Calcium: 9.2 mg/dL (ref 8.9–10.3)
Creatinine, Ser: 0.69 mg/dL (ref 0.44–1.00)
GFR calc non Af Amer: >60 mL/min (ref >60)
Glucose, Bld: 156 mg/dL — ABNORMAL HIGH (ref 65–99)
POTASSIUM: 4.1 mmol/L (ref 3.5–5.1)
SODIUM: 141 mmol/L (ref 135–145)

## 2017-07-09 MED ORDER — METHYLPREDNISOLONE SODIUM SUCC 125 MG IJ SOLR
60.0000 mg | Freq: Every day | INTRAMUSCULAR | Status: DC
  Administered 2017-07-10: 08:00:00 60 mg via INTRAVENOUS
  Filled 2017-07-09: qty 2

## 2017-07-09 MED ORDER — LEVOFLOXACIN 250 MG PO TABS: 250.0000 mg | ORAL_TABLET | Freq: Every day | ORAL | Status: DC

## 2017-07-09 NOTE — Care Management Note (Signed)
Case Management Note  Patient Details  Name: Ashley [name] MRN: [id number] Date of Birth: 04/17/1956  Subjective/Objective:                 Spoke to patient at the bedside. Admitted from home w spouse for COPD exac. She states that she does not have insurance, goes to [facility] for care and medications. She states she has toilet seat riser, 3/1, shower seat, rollator, and nebulizer at home. No HH needs anticipated at this time.    Action/Plan:  CM will continue to follow.  Expected Discharge Date:  07/10/17               Expected Discharge Plan:  Home/Self Care  In-House Referral:     Discharge planning Services  CM Consult  Post Acute Care Choice:    Choice offered to:     DME Arranged:    DME Agency:     HH Arranged:    HH Agency:     Status of Service:  In process, will continue to follow  If discussed at Long Length of Stay Meetings, dates discussed:    Additional Comments:  Lawerance SabalDebbie Adalida Garver, RN 07/09/2017, 3:29 PM

## 2017-07-09 NOTE — Progress Notes (Signed)
Levaquin renal adjustment  Levaquin renally adjusted to 250 mg PO q24 hours.   Demetrius Charityeldrin D. Jeneane Pieczynski, PharmD

## 2017-07-09 NOTE — Progress Notes (Signed)
[facility] - [place] at [facility]   PATIENT NAME: Ashley [name]    MR#:  [id number]  DATE OF BIRTH:  05/17/1956  SUBJECTIVE:   Came in with increasing sob REVIEW OF SYSTEMS:   Review of Systems  Constitutional: Negative for chills, fever and weight loss.  HENT: Negative for ear discharge, ear pain and nosebleeds.   Eyes: Negative for blurred vision, pain and discharge.  Respiratory: Positive for shortness of breath. Negative for sputum production, wheezing and stridor.   Cardiovascular: Negative for chest pain, palpitations, orthopnea and PND.  Gastrointestinal: Negative for abdominal pain, diarrhea, nausea and vomiting.  Genitourinary: Negative for frequency and urgency.  Musculoskeletal: Negative for back pain and joint pain.  Neurological: Positive for weakness. Negative for sensory change, speech change and focal weakness.  Psychiatric/Behavioral: Negative for depression and hallucinations. The patient is not nervous/anxious.    Tolerating Diet: Tolerating PT:   DRUG ALLERGIES:   Allergies  Allergen Reactions  . Codeine Nausea Only    VITALS:  Blood pressure (!) 120/47, pulse 91, temperature 98.3 F (36.8 C), temperature source Oral, resp. rate 18, height 5\' 2"  (1.575 m), weight 33.3 kg (73 lb 6.4 oz), SpO2 97 %.  PHYSICAL EXAMINATION:   Physical Exam  GENERAL:  61 y.o.-year-old patient lying in the bed with no acute distress. thin EYES: Pupils equal, round, reactive to light and accommodation. No scleral icterus. Extraocular muscles intact.  HEENT: Head atraumatic, normocephalic. Oropharynx and nasopharynx clear.  NECK:  Supple, no jugular venous distention. No thyroid enlargement, no tenderness.  LUNGS: distant breath sounds bilaterally, no wheezing, rales, rhonchi. No use of accessory muscles of respiration.  CARDIOVASCULAR: S1, S2 normal. No murmurs, rubs, or gallops.  ABDOMEN: Soft, nontender, nondistended. Bowel sounds present. No  organomegaly or mass.  EXTREMITIES: No cyanosis, clubbing or edema b/l.    NEUROLOGIC: Cranial nerves II through XII are intact. No focal Motor or sensory deficits b/l.   PSYCHIATRIC:  patient is alert and oriented x 3.  SKIN: No obvious rash, lesion, or ulcer.   LABORATORY PANEL:  CBC  Recent Labs Lab 07/09/17 0512  WBC 5.2  HGB 13.5  HCT 39.3  PLT 176    Chemistries   Recent Labs Lab 07/08/17 1243 07/09/17 0512  NA 140 141  K 4.3 4.1  CL 103 106  CO2 26 28  GLUCOSE 100* 156*  BUN 14 13  CREATININE 0.56 0.69  CALCIUM 9.7 9.2  AST 27  --   ALT 9*  --   ALKPHOS 87  --   BILITOT 1.4*  --    Cardiac Enzymes  Recent Labs Lab 07/08/17 1243  TROPONINI <0.03   RADIOLOGY:  Dg Chest Port 1 View  Result Date: 07/08/2017 CLINICAL DATA:  Shortness of Breath EXAM: PORTABLE CHEST 1 VIEW COMPARISON:  February 17, 2017. FINDINGS: Lungs remain hyperexpanded with scarring in the apical regions. There is no edema or consolidation. The heart size and pulmonary vascularity are normal. No adenopathy. There is aortic atherosclerosis. No evident bone lesions. IMPRESSION: Scarring in the upper lung zones with generalized hyperexpansion. No edema or consolidation. Cardiac silhouette within normal limits. There is aortic atherosclerosis. Aortic Atherosclerosis (ICD10-I70.0). Electronically Signed   By: Ashley [name] M.D.   On: 07/08/2017 13:08   ASSESSMENT AND PLAN:  Ashley [name]  is a 60 y.o. female with a known history of Anxiety, COPD, hypertension- not on oxygen at home but her nebulizer and inhalers.For last 2-3 days she is  feeling increasingly short of breath and has some cough.  * Acute COPD exacerbation   IV steroid and nebulizer therapy    Spiriva, antibiotics.   Supplemental oxygens for now.  * Acute respiratory failure with hypoxia   This is due to COPD exacerbation, nasal cannula oxygen for now. sats >92% on Ambulation   * Hypertension    continue lisinopril and  metoprolol.   * Active smoking   counseled to quit smoking for 4 minutes and offered nicotine patch.   Case discussed with Care Management/Social Worker. Management plans discussed with the patient, family and they are in agreement.  CODE STATUS: full  DVT Prophylaxis:lovenox  TOTAL TIME TAKING CARE OF THIS PATIENT: 25 minutes.  >50% time spent on counselling and coordination of care  POSSIBLE D/C IN 1-2 DAYS, DEPENDING ON CLINICAL CONDITION.  Note: This dictation was prepared with Dragon dictation along with smaller phrase technology. Any transcriptional errors that result from this process are unintentional.  Ashley [name] M.D on 07/09/2017 at 6:32 PM  Between 7am to 6pm - Pager - [phone]  After 6pm go to [email or web address] - Social research officer, [email or web address] [email or web address]  [facility] [place] [facility]  Office  [phone]  CC: Primary care physician; Ashley CookeyHeadrick, [name], FNP

## 2017-07-09 NOTE — Progress Notes (Signed)
Pt complains of burning with urination.  MD paged.  Urinalysis ordered.

## 2017-07-10 MED ORDER — PREDNISONE 10 MG PO TABS: ORAL_TABLET | ORAL | 0 refills | Status: DC

## 2017-07-10 MED ORDER — PREDNISONE 50 MG PO TABS: 50.0000 mg | ORAL_TABLET | Freq: Every day | ORAL | Status: DC

## 2017-07-10 NOTE — Discharge Summary (Signed)
[facility] [place] [facility]   PATIENT NAME: Anntoinette Haefele    MR#:  [id number]  DATE OF BIRTH:  18-Feb-1956  DATE OF ADMISSION:  07/08/2017 ADMITTING PHYSICIAN: Altamese Dilling, MD  DATE OF DISCHARGE: 07/10/2017  PRIMARY CARE PHYSICIAN: Toy Cookey, FNP    ADMISSION DIAGNOSIS:  COPD exacerbation (HCC) [J44.1] Syncope, unspecified syncope type [R55]  DISCHARGE DIAGNOSIS:  Acute On chronic COPD exacerbation Ongoing tobacco abuse  SECONDARY DIAGNOSIS:   Past Medical History:  Diagnosis Date  . Anxiety   . COPD (chronic obstructive pulmonary disease) (HCC)   . Hypertension     HOSPITAL COURSE:   SusanKingis a 61 y.o.femalewith a known history of Anxiety, COPD, hypertension- not on oxygen at home but her nebulizer and inhalers.For last 2-3 days she is feeling increasingly short of breath and has some cough.  * Acute COPD exacerbation IV steroid and nebulizer therapy --- changed to oral steroids ContinueSpiriva, no indication for antibiotics. Weaned off to room air sats> 92% on room air on ambulation  * Acute respiratory failure with hypoxia --Improved sats >92% on Ambulation   * Hypertension continue lisinopril and metoprolol.   * Active smoking counseled to quit smoking for 4 minutes and offered nicotine patch.  Overall at baseline discharge home. CONSULTS OBTAINED:    DRUG ALLERGIES:   Allergies  Allergen Reactions  . Codeine Nausea Only    DISCHARGE MEDICATIONS:   Current Discharge Medication List    START taking these medications   Details  predniSONE (DELTASONE) 10 MG tablet Take 50 mg daily taper by 10 mg daily then stop Qty: 15 tablet, Refills: 0      CONTINUE these medications which have NOT CHANGED   Details  albuterol (PROVENTIL) (5 MG/ML) 0.5% nebulizer solution Take 2.5 mg by nebulization every 4 (four) hours as needed for wheezing or shortness of breath.    tiotropium (SPIRIVA) 18  MCG inhalation capsule Place 18 mcg into inhaler and inhale daily.    albuterol (PROAIR HFA) 108 (90 Base) MCG/ACT inhaler Inhale 2 puffs into the lungs every 6 (six) hours as needed for wheezing or shortness of breath. Qty: 1 Inhaler, Refills: 1    feeding supplement, ENSURE ENLIVE, (ENSURE ENLIVE) LIQD Take 237 mLs by mouth 3 (three) times daily between meals. Qty: 90 Bottle, Refills: 1    lisinopril (PRINIVIL,ZESTRIL) 5 MG tablet Take 5 mg by mouth daily.    metoprolol succinate (TOPROL-XL) 25 MG 24 hr tablet Take 25 mg by mouth daily.    promethazine (PHENERGAN) 12.5 MG tablet Take 1 tablet (12.5 mg total) by mouth every 6 (six) hours as needed for nausea or vomiting. Qty: 30 tablet, Refills: 0        If you experience worsening of your admission symptoms, develop shortness of breath, life threatening emergency, suicidal or homicidal thoughts you must seek medical attention immediately by calling 911 or calling your MD immediately  if symptoms less severe.  You Must read complete instructions/literature along with all the possible adverse reactions/side effects for all the Medicines you take and that have been prescribed to you. Take any new Medicines after you have completely understood and accept all the possible adverse reactions/side effects.   Please note  You were cared for by a hospitalist during your hospital stay. If you have any questions about your discharge medications or the care you received while you were in the hospital after you are discharged, you can call the unit and asked to speak  with the hospitalist on call if the hospitalist that took care of you is not available. Once you are discharged, your primary care physician will handle any further medical issues. Please note that NO REFILLS for any discharge medications will be authorized once you are discharged, as it is imperative that you return to your primary care physician (or establish a relationship with a primary  care physician if you do not have one) for your aftercare needs so that they can reassess your need for medications and monitor your lab values. Today   SUBJECTIVE   I slept a lot better  VITAL SIGNS:  Blood pressure (!) 112/55, pulse 67, temperature (!) 97.5 F (36.4 C), temperature source Oral, resp. rate 16, height 5\' 2"  (1.575 m), weight 33.3 kg (73 lb 6.4 oz), SpO2 97 %.  I/O:   Intake/Output Summary (Last 24 hours) at 07/10/17 0747 Last data filed at 07/09/17 1800  Gross per 24 hour  Intake              360 ml  Output                0 ml  Net              360 ml    PHYSICAL EXAMINATION:  GENERAL:  61 y.o.-year-old patient lying in the bed with no acute distress. Thin cachectic EYES: Pupils equal, round, reactive to light and accommodation. No scleral icterus. Extraocular muscles intact.  HEENT: Head atraumatic, normocephalic. Oropharynx and nasopharynx clear.  NECK:  Supple, no jugular venous distention. No thyroid enlargement, no tenderness.  LUNGS: Distant breath sounds bilaterally, no wheezing, rales,rhonchi or crepitation. No use of accessory muscles of respiration.  CARDIOVASCULAR: S1, S2 normal. No murmurs, rubs, or gallops.  ABDOMEN: Soft, non-tender, non-distended. Bowel sounds present. No organomegaly or mass.  EXTREMITIES: No pedal edema, cyanosis, or clubbing.  NEUROLOGIC: Cranial nerves II through XII are intact. Muscle strength 5/5 in all extremities. Sensation intact. Gait not checked.  PSYCHIATRIC: The patient is alert and oriented x 3.  SKIN: No obvious rash, lesion, or ulcer.   DATA REVIEW:   CBC   Recent Labs Lab 07/09/17 0512  WBC 5.2  HGB 13.5  HCT 39.3  PLT 176    Chemistries   Recent Labs Lab 07/08/17 1243 07/09/17 0512  NA 140 141  K 4.3 4.1  CL 103 106  CO2 26 28  GLUCOSE 100* 156*  BUN 14 13  CREATININE 0.56 0.69  CALCIUM 9.7 9.2  AST 27  --   ALT 9*  --   ALKPHOS 87  --   BILITOT 1.4*  --     Microbiology Results    No results found for this or any previous visit (from the past 240 hour(s)).  RADIOLOGY:  Dg Chest Port 1 View  Result Date: 07/08/2017 CLINICAL DATA:  Shortness of Breath EXAM: PORTABLE CHEST 1 VIEW COMPARISON:  February 17, 2017. FINDINGS: Lungs remain hyperexpanded with scarring in the apical regions. There is no edema or consolidation. The heart size and pulmonary vascularity are normal. No adenopathy. There is aortic atherosclerosis. No evident bone lesions. IMPRESSION: Scarring in the upper lung zones with generalized hyperexpansion. No edema or consolidation. Cardiac silhouette within normal limits. There is aortic atherosclerosis. Aortic Atherosclerosis (ICD10-I70.0). Electronically Signed   By: Bretta Bang III M.D.   On: 07/08/2017 13:08     Management plans discussed with the patient, family and they are in agreement.  CODE STATUS:  Code Status Orders        Start     Ordered   07/08/17 1825  Full code  Continuous     07/08/17 1824    Code Status History    Date Active Date Inactive Code Status Order ID Comments User Context   02/17/2017  2:25 PM 02/19/2017  4:29 PM Full Code [id number]  Enid BaasKalisetti, Radhika, MD Inpatient   06/01/2016  2:00 AM 06/02/2016  6:06 PM Full Code [id number]  Gery Prayrosley, Debby, MD Inpatient   09/30/2015  4:04 PM 10/05/2015  3:50 PM Full Code [id number]  Altamese DillingVachhani, Vaibhavkumar, MD ED   09/22/2015  5:47 PM 09/24/2015  2:04 PM Full Code [id number]  Enid BaasKalisetti, Radhika, MD Inpatient      TOTAL TIME TAKING CARE OF THIS PATIENT: 40 minutes.    Loura Pitt M.D on 07/10/2017 at 7:47 AM  Between 7am to 6pm - Pager - [phone] After 6pm go to [email or web address] - Social research officer, governmentpassword [email or web address] [email or web address]  [facility]  Office  [phone]  CC: Primary care physician; [name], [name], FNP

## 2017-07-10 NOTE — Progress Notes (Signed)
Pt being discharged home, discharge instructions reviewed with pt, states understanding, pt with no complaints 

## 2017-07-10 NOTE — Care Management (Signed)
Discharge to home today per Dr. Enedina FinnerSona Patel. No additional follow-up needs. Family/friend will transport Gwenette GreetBrenda S Braven Wolk RN MSN CCM Care Management [phone]

## 2017-08-08 ENCOUNTER — Emergency Department: Payer: Self-pay

## 2017-08-08 ENCOUNTER — Emergency Department
Admission: EM | Admit: 2017-08-08 | Discharge: 2017-08-08 | Disposition: A | Discharge status: Home / Self Care | Payer: Self-pay | Attending: Emergency Medicine | Admitting: Emergency Medicine

## 2017-08-08 ENCOUNTER — Encounter: Payer: Self-pay | Admitting: Emergency Medicine

## 2017-08-08 DIAGNOSIS — Z7982 Long term (current) use of aspirin: Secondary | ICD-10-CM | POA: Insufficient documentation

## 2017-08-08 DIAGNOSIS — F17210 Nicotine dependence, cigarettes, uncomplicated: Secondary | ICD-10-CM | POA: Insufficient documentation

## 2017-08-08 DIAGNOSIS — Z79899 Other long term (current) drug therapy: Secondary | ICD-10-CM | POA: Insufficient documentation

## 2017-08-08 DIAGNOSIS — I10 Essential (primary) hypertension: Secondary | ICD-10-CM | POA: Insufficient documentation

## 2017-08-08 DIAGNOSIS — J441 Chronic obstructive pulmonary disease with (acute) exacerbation: Secondary | ICD-10-CM | POA: Insufficient documentation

## 2017-08-08 LAB — COMPREHENSIVE METABOLIC PANEL
ALK PHOS: 109 U/L (ref 38–126)
ALT: 13 U/L — AB (ref 14–54)
AST: 25 U/L (ref 15–41)
Albumin: 4.5 g/dL (ref 3.5–5.0)
Anion gap: 8 (ref 5–15)
BILIRUBIN TOTAL: 1 mg/dL (ref 0.3–1.2)
BUN: 8 mg/dL (ref 6–20)
CALCIUM: 9.6 mg/dL (ref 8.9–10.3)
CO2: 30 mmol/L (ref 22–32)
CREATININE: 0.67 mg/dL (ref 0.44–1.00)
Chloride: 105 mmol/L (ref 101–111)
Glucose, Bld: 109 mg/dL — ABNORMAL HIGH (ref 65–99)
Potassium: 4.3 mmol/L (ref 3.5–5.1)
SODIUM: 143 mmol/L (ref 135–145)
Total Protein: 7.3 g/dL (ref 6.5–8.1)

## 2017-08-08 LAB — CBC WITH DIFFERENTIAL/PLATELET
Basophils Absolute: 0 10*3/uL (ref 0–0.1)
Basophils Relative: 1 %
Eosinophils Absolute: 0 10*3/uL (ref 0–0.7)
Eosinophils Relative: 1 %
HEMATOCRIT: 47 % (ref 35.0–47.0)
HEMOGLOBIN: 16 g/dL (ref 12.0–16.0)
Lymphocytes Relative: 22 %
Lymphs Abs: 1.2 10*3/uL (ref 1.0–3.6)
MCH: 30 pg (ref 26.0–34.0)
MCHC: 34 g/dL (ref 32.0–36.0)
MCV: 88.4 fL (ref 80.0–100.0)
MONOS PCT: 7 %
Monocytes Absolute: 0.4 10*3/uL (ref 0.2–0.9)
NEUTROS ABS: 3.8 10*3/uL (ref 1.4–6.5)
NEUTROS PCT: 69 %
Platelets: 217 10*3/uL (ref 150–440)
RBC: 5.32 MIL/uL — AB (ref 3.80–5.20)
RDW: 13.5 % (ref 11.5–14.5)
WBC: 5.4 10*3/uL (ref 3.6–11.0)

## 2017-08-08 LAB — TROPONIN I: Troponin I: <0.03 ng/mL (ref <0.03)

## 2017-08-08 MED ORDER — SODIUM CHLORIDE 0.9 % IV BOLUS (SEPSIS)
1000.0000 mL | Freq: Once | INTRAVENOUS | Status: AC
  Administered 2017-08-08: 1000 mL via INTRAVENOUS

## 2017-08-08 MED ORDER — AZITHROMYCIN 250 MG PO TABS: ORAL_TABLET | ORAL | 0 refills | Status: AC

## 2017-08-08 MED ORDER — IPRATROPIUM-ALBUTEROL 0.5-2.5 (3) MG/3ML IN SOLN
3.0000 mL | Freq: Once | RESPIRATORY_TRACT | Status: AC
  Administered 2017-08-08: 3 mL via RESPIRATORY_TRACT

## 2017-08-08 MED ORDER — PREDNISONE 10 MG PO TABS: 50.0000 mg | ORAL_TABLET | Freq: Every day | ORAL | 0 refills | Status: AC

## 2017-08-08 MED ORDER — METHYLPREDNISOLONE SODIUM SUCC 125 MG IJ SOLR
60.0000 mg | Freq: Once | INTRAMUSCULAR | Status: AC
  Administered 2017-08-08: 60 mg via INTRAVENOUS

## 2017-08-08 NOTE — ED Triage Notes (Signed)
Patient presents to ED via [facility] from home with c/o SOB. Hx of COPD. Patient wears 2L of oxygen at home. EMS report patients SpO2 was 92% on 2L. Patient had one neb treatment at home and EMS gave her one in route. Patient denies CP. MD at bedside.

## 2017-08-08 NOTE — Discharge Instructions (Signed)
Please take all of your steroids as prescribed and make an appointment to see your primary care provider this coming Monday for reevaluation. Return to the emergency department sooner for any concerns.  It was a pleasure to take care of you today, and thank you for coming to our emergency department.  If you have any questions or concerns before leaving please ask the nurse to grab me and I'm more than happy to go through your aftercare instructions again.  If you were prescribed any opioid pain medication today such as Norco, Vicodin, Percocet, morphine, hydrocodone, or oxycodone please make sure you do not drive when you are taking this medication as it can alter your ability to drive safely.  If you have any concerns once you are home that you are not improving or are in fact getting worse before you can make it to your follow-up appointment, please do not hesitate to call 911 and come back for further evaluation.  Merrily BrittleNeil Jonan Seufert, MD  Results for orders placed or performed during the hospital encounter of 08/08/17  Troponin I  Result Value Ref Range   Troponin I <0.03 <0.03 ng/mL  CBC with Differential  Result Value Ref Range   WBC 5.4 3.6 - 11.0 K/uL   RBC 5.32 (H) 3.80 - 5.20 MIL/uL   Hemoglobin 16.0 12.0 - 16.0 g/dL   HCT 82.947.0 56.235.0 - 13.047.0 %   MCV 88.4 80.0 - 100.0 fL   MCH 30.0 26.0 - 34.0 pg   MCHC 34.0 32.0 - 36.0 g/dL   RDW 86.513.5 78.411.5 - 69.614.5 %   Platelets 217 150 - 440 K/uL   Neutrophils Relative % 69 %   Neutro Abs 3.8 1.4 - 6.5 K/uL   Lymphocytes Relative 22 %   Lymphs Abs 1.2 1.0 - 3.6 K/uL   Monocytes Relative 7 %   Monocytes Absolute 0.4 0.2 - 0.9 K/uL   Eosinophils Relative 1 %   Eosinophils Absolute 0.0 0 - 0.7 K/uL   Basophils Relative 1 %   Basophils Absolute 0.0 0 - 0.1 K/uL  Comprehensive metabolic panel  Result Value Ref Range   Sodium 143 135 - 145 mmol/L   Potassium 4.3 3.5 - 5.1 mmol/L   Chloride 105 101 - 111 mmol/L   CO2 30 22 - 32 mmol/L   Glucose,  Bld 109 (H) 65 - 99 mg/dL   BUN 8 6 - 20 mg/dL   Creatinine, Ser 2.950.67 0.44 - 1.00 mg/dL   Calcium 9.6 8.9 - 28.410.3 mg/dL   Total Protein 7.3 6.5 - 8.1 g/dL   Albumin 4.5 3.5 - 5.0 g/dL   AST 25 15 - 41 U/L   ALT 13 (L) 14 - 54 U/L   Alkaline Phosphatase 109 38 - 126 U/L   Total Bilirubin 1.0 0.3 - 1.2 mg/dL   GFR calc non Af Amer >60 >60 mL/min   GFR calc Af Amer >60 >60 mL/min   Anion gap 8 5 - 15   Dg Chest Port 1 View  Result Date: 08/08/2017 CLINICAL DATA:  Shortness of breath.  COPD. EXAM: PORTABLE CHEST 1 VIEW COMPARISON:  07/08/2017.  10/24/2016.  07/07/2016 FINDINGS: Mediastinum hilar structures are normal. Left apical pleural-parenchymal thickening consistent with scarring. Retraction of the left hilum is present. Heart size normal. Left base pleural thickening consistent scarring. No pneumothorax. Metallic density noted over the upper abdomen, this may be extrinsic to the patient. Correlation suggested. IMPRESSION: Left upper lobe pleural-parenchymal thickening with retraction of the left  hilum. Left base pleural thickening consistent with scarring. No change from prior exam. No acute pulmonary disease noted. Electronically Signed   By: Maisie Fus  Register   On: 08/08/2017 10:39

## 2017-08-08 NOTE — ED Notes (Signed)
Pt verbalizes understanding of d/c teaching and rx. Pt waiting in lobby for ride, pt in NAD at this time, pt states home health is supposed to bringing o2 for at home this week. Pt in WC to lobby.

## 2017-08-08 NOTE — ED Provider Notes (Signed)
[facility] Emergency Department Provider Note  ____________________________________________   First MD Initiated Contact with Patient 08/08/17 1009     (approximate)  I have reviewed the triage vital signs and the nursing notes.   HISTORY  Chief Complaint Shortness of Breath    HPI Ashley [name] is a 61 y.o. female who comes to the emergency department via EMS with 1 day of shortness of breath. She has a past medical history of COPD and is not oxygen dependent although will shortly be oxygen dependent at home. She's had for the last day or so she has had more cough or shortness of breath. When EMS arrived they noted her to be somewhat hypoxic and short arrest they gave her breathing treatment which seemed to help. She's had no leg swelling. No fevers or chills. Her symptoms came on insidiously and of improved with albuterol. His compliance with her medications at home.She has moderate severity sharp upper chest pain worse with cough improved with coughing. Nonradiating.   Past Medical History:  Diagnosis Date  . Anxiety   . COPD (chronic obstructive pulmonary disease) (HCC)   . Hypertension     Patient Active Problem List   Diagnosis Date Noted  . COPD with acute exacerbation (HCC) 07/08/2017  . COPD exacerbation (HCC) 07/08/2017  . Sepsis (HCC) 02/17/2017  . COPD (chronic obstructive pulmonary disease) (HCC) 06/01/2016  . HTN (hypertension) 06/01/2016  . Diarrhea 10/05/2015  . Protein-calorie malnutrition, severe 10/02/2015  . Malnutrition (HCC) 10/02/2015  . Pressure ulcer 10/01/2015  . SVT (supraventricular tachycardia) (HCC) 09/30/2015  . Pneumonia 09/30/2015  . Chest pain 09/22/2015    Past Surgical History:  Procedure Laterality Date  . ABDOMINAL HYSTERECTOMY      Prior to Admission medications   Medication Sig Start Date End Date Taking? Authorizing Provider  albuterol (PROAIR HFA) 108 (90 Base) MCG/ACT inhaler Inhale 2 puffs into  the lungs every 6 (six) hours as needed for wheezing or shortness of breath. 06/02/16  Yes Gouru, Deanna Artis, MD  albuterol (PROVENTIL) (5 MG/ML) 0.5% nebulizer solution Take 2.5 mg by nebulization every 4 (four) hours as needed for wheezing or shortness of breath.   Yes [provider]  aspirin EC 81 MG tablet Take 1 tablet by mouth daily.   Yes [provider]  feeding supplement, ENSURE ENLIVE, (ENSURE ENLIVE) LIQD Take 237 mLs by mouth 3 (three) times daily between meals. 06/02/16  Yes Gouru, Aruna, MD  lisinopril (PRINIVIL,ZESTRIL) 5 MG tablet Take 5 mg by mouth daily.   Yes [provider]  metoprolol succinate (TOPROL-XL) 25 MG 24 hr tablet Take 25 mg by mouth daily.   Yes [provider]  tiotropium (SPIRIVA) 18 MCG inhalation capsule Place 18 mcg into inhaler and inhale daily.   Yes [provider]  azithromycin (ZITHROMAX Z-PAK) 250 MG tablet Take 2 tablets (500 mg) on  Day 1,  followed by 1 tablet (250 mg) once daily on Days 2 through 5. 08/08/17 08/13/17  Merrily Brittle, MD  predniSONE (DELTASONE) 10 MG tablet Take 5 tablets (50 mg total) by mouth daily. 08/08/17 08/12/17  Merrily Brittle, MD  promethazine (PHENERGAN) 12.5 MG tablet Take 1 tablet (12.5 mg total) by mouth every 6 (six) hours as needed for nausea or vomiting. Patient not taking: Reported on 08/08/2017 02/19/17   Auburn Bilberry, MD    Allergies Codeine  Family History  Problem Relation Age of Onset  . CAD Mother   . CAD Father   .  Cancer - Prostate Brother   . Cancer - Lung Brother     Social History Social History  Substance Use Topics  . Smoking status: Current Every Day Smoker    Packs/day: 0.50    Types: Cigarettes  . Smokeless tobacco: Never Used  . Alcohol use No    Review of Systems Constitutional: No fever/chills Eyes: No visual changes. ENT: No sore throat. Cardiovascular: Positive chest pain. Respiratory: Positive shortness of breath. Gastrointestinal: No  abdominal pain.  No nausea, no vomiting.  No diarrhea.  No constipation. Genitourinary: Negative for dysuria. Musculoskeletal: Negative for back pain. Skin: Negative for rash. Neurological: Negative for headaches, focal weakness or numbness.   ____________________________________________   PHYSICAL EXAM:  VITAL SIGNS: ED Triage Vitals [08/08/17 1008]  Enc Vitals Group     BP      Pulse Rate (!) 50     Resp 19     Temp      Temp src      SpO2 100 %     Weight 75 lb (34 kg)     Height 5\' 5"  (1.651 m)     Head Circumference      Peak Flow      Pain Score      Pain Loc      Pain Edu?      Excl. in GC?     Constitutional: Cachectic and chronically ill-appearing able to speak in short sentences in mild respiratory distress Eyes: PERRL EOMI. Head: Atraumatic. Nose: No congestion/rhinnorhea. Mouth/Throat: No trismus Neck: No stridor.   Cardiovascular: Tachycardic rate, regular rhythm. Grossly normal heart sounds.  Good peripheral circulation. Respiratory: Mild respiratory distress moving somewhat limited amounts of air with expiratory wheezes and prolonged expiratory phase Gastrointestinal: Soft nontender Musculoskeletal: No lower extremity edema  somewhat tremulous Neurologic:  Normal speech and language. No gross focal neurologic deficits are appreciated. Skin:  Skin is warm, dry and intact. No rash noted. Psychiatric: Mood and affect are normal. Speech and behavior are normal.    ____________________________________________   DIFFERENTIAL includes but not limited to  COPD exacerbation, pneumothorax, pulmonary embolism, acute coronary syndrome ____________________________________________   LABS (all labs ordered are listed, but only abnormal results are displayed)  Labs Reviewed  CBC WITH DIFFERENTIAL/PLATELET - Abnormal; Notable for the following:       Result Value   RBC 5.32 (*)    All other components within normal limits  COMPREHENSIVE METABOLIC PANEL -  Abnormal; Notable for the following:    Glucose, Bld 109 (*)    ALT 13 (*)    All other components within normal limits  TROPONIN I    No signs of acute ischemia __________________________________________  EKG  ED ECG REPORT I, Merrily BrittleNeil Sweet Jarvis, the attending physician, personally viewed and interpreted this ECG.  Date: 08/08/2017 EKG Time:  Rate: 99 Rhythm: normal sinus rhythm QRS Axis: normal Intervals: normal ST/T Wave abnormalities: normal Narrative Interpretation: Very wavy baseline and difficult to interpret but no acute disease noted  ____________________________________________  RADIOLOGY  Chest x-ray was significant chronic changes but no acute disease ____________________________________________   PROCEDURES  Procedure(s) performed: no  Procedures  Critical Care performed: no  Observation: no ____________________________________________   INITIAL IMPRESSION / ASSESSMENT AND PLAN / ED COURSE  Pertinent labs & imaging results that were available during my care of the patient were reviewed by me and considered in my medical decision making (see chart for details).  The patient arrives saturating well but somewhat short of breath  and moving slightly limited amounts of air. 3 more breathing treatments as well as Solu-Medrol and reevaluation.  After several hours of observation and multiple breathing treatments the patient is now breathing comfortably and saturating well on room air. She feels improved and she'll be medically stable for outpatient management with a short course of steroids as well as azithromycin.      ____________________________________________   FINAL CLINICAL IMPRESSION(S) / ED DIAGNOSES  Final diagnoses:  COPD exacerbation (HCC)      NEW MEDICATIONS STARTED DURING THIS VISIT:  Discharge Medication List as of 08/08/2017  1:54 PM    START taking these medications   Details  azithromycin (ZITHROMAX Z-PAK) 250 MG tablet Take  2 tablets (500 mg) on  Day 1,  followed by 1 tablet (250 mg) once daily on Days 2 through 5., Print         Note:  This document was prepared using Dragon voice recognition software and may include unintentional dictation errors.     Merrily Brittle, MD 08/09/17 [phone]

## 2017-08-08 NOTE — ED Notes (Signed)
Pt up to bedside commode with one assist at this time

## 2017-11-27 ENCOUNTER — Encounter: Payer: Self-pay | Admitting: Emergency Medicine

## 2017-11-27 ENCOUNTER — Other Ambulatory Visit: Payer: Self-pay

## 2017-11-27 ENCOUNTER — Inpatient Hospital Stay
Admission: EM | Admit: 2017-11-27 | Discharge: 2017-12-03 | DRG: 190 | Disposition: A | Discharge status: Home / Self Care | Payer: Self-pay | Attending: Internal Medicine | Admitting: Internal Medicine

## 2017-11-27 ENCOUNTER — Emergency Department: Payer: Self-pay

## 2017-11-27 DIAGNOSIS — E46 Unspecified protein-calorie malnutrition: Secondary | ICD-10-CM | POA: Diagnosis present

## 2017-11-27 DIAGNOSIS — I959 Hypotension, unspecified: Secondary | ICD-10-CM | POA: Diagnosis not present

## 2017-11-27 DIAGNOSIS — I4891 Unspecified atrial fibrillation: Secondary | ICD-10-CM | POA: Diagnosis present

## 2017-11-27 DIAGNOSIS — J440 Chronic obstructive pulmonary disease with acute lower respiratory infection: Secondary | ICD-10-CM | POA: Diagnosis present

## 2017-11-27 DIAGNOSIS — Z9981 Dependence on supplemental oxygen: Secondary | ICD-10-CM

## 2017-11-27 DIAGNOSIS — Z885 Allergy status to narcotic agent status: Secondary | ICD-10-CM

## 2017-11-27 DIAGNOSIS — F419 Anxiety disorder, unspecified: Secondary | ICD-10-CM | POA: Diagnosis present

## 2017-11-27 DIAGNOSIS — Z681 Body mass index (BMI) 19 or less, adult: Secondary | ICD-10-CM

## 2017-11-27 DIAGNOSIS — I10 Essential (primary) hypertension: Secondary | ICD-10-CM | POA: Diagnosis present

## 2017-11-27 DIAGNOSIS — F17210 Nicotine dependence, cigarettes, uncomplicated: Secondary | ICD-10-CM | POA: Diagnosis present

## 2017-11-27 DIAGNOSIS — E43 Unspecified severe protein-calorie malnutrition: Secondary | ICD-10-CM | POA: Diagnosis present

## 2017-11-27 DIAGNOSIS — J9621 Acute and chronic respiratory failure with hypoxia: Secondary | ICD-10-CM | POA: Diagnosis present

## 2017-11-27 DIAGNOSIS — J441 Chronic obstructive pulmonary disease with (acute) exacerbation: Principal | ICD-10-CM | POA: Diagnosis present

## 2017-11-27 DIAGNOSIS — J209 Acute bronchitis, unspecified: Secondary | ICD-10-CM | POA: Diagnosis present

## 2017-11-27 LAB — COMPREHENSIVE METABOLIC PANEL
ALBUMIN: 4.1 g/dL (ref 3.5–5.0)
ALT: 12 U/L — ABNORMAL LOW (ref 14–54)
ANION GAP: 11 (ref 5–15)
AST: 21 U/L (ref 15–41)
Alkaline Phosphatase: 94 U/L (ref 38–126)
BILIRUBIN TOTAL: 0.6 mg/dL (ref 0.3–1.2)
BUN: 13 mg/dL (ref 6–20)
CHLORIDE: 105 mmol/L (ref 101–111)
CO2: 24 mmol/L (ref 22–32)
Calcium: 9.1 mg/dL (ref 8.9–10.3)
Creatinine, Ser: 0.54 mg/dL (ref 0.44–1.00)
GFR calc Af Amer: >60 mL/min (ref >60)
GFR calc non Af Amer: >60 mL/min (ref >60)
GLUCOSE: 118 mg/dL — AB (ref 65–99)
POTASSIUM: 3.7 mmol/L (ref 3.5–5.1)
SODIUM: 140 mmol/L (ref 135–145)
TOTAL PROTEIN: 6.2 g/dL — AB (ref 6.5–8.1)

## 2017-11-27 LAB — TSH: TSH: 4.178 u[IU]/mL (ref 0.350–4.500)

## 2017-11-27 LAB — CBC WITH DIFFERENTIAL/PLATELET
BASOS ABS: 0 10*3/uL (ref 0–0.1)
BASOS PCT: 1 %
EOS ABS: 0.1 10*3/uL (ref 0–0.7)
EOS PCT: 2 %
HEMATOCRIT: 42.3 % (ref 35.0–47.0)
Hemoglobin: 14.5 g/dL (ref 12.0–16.0)
Lymphocytes Relative: 28 %
Lymphs Abs: 1.4 10*3/uL (ref 1.0–3.6)
MCH: 30.5 pg (ref 26.0–34.0)
MCHC: 34.3 g/dL (ref 32.0–36.0)
MCV: 88.9 fL (ref 80.0–100.0)
MONO ABS: 0.4 10*3/uL (ref 0.2–0.9)
MONOS PCT: 8 %
Neutro Abs: 3.1 10*3/uL (ref 1.4–6.5)
Neutrophils Relative %: 61 %
PLATELETS: 188 10*3/uL (ref 150–440)
RBC: 4.76 MIL/uL (ref 3.80–5.20)
RDW: 13.6 % (ref 11.5–14.5)
WBC: 5.1 10*3/uL (ref 3.6–11.0)

## 2017-11-27 LAB — TROPONIN I

## 2017-11-27 LAB — BRAIN NATRIURETIC PEPTIDE: B Natriuretic Peptide: 45 pg/mL (ref 0.0–100.0)

## 2017-11-27 MED ORDER — IPRATROPIUM-ALBUTEROL 0.5-2.5 (3) MG/3ML IN SOLN: 3.0000 mL | Freq: Once | RESPIRATORY_TRACT | Status: DC

## 2017-11-27 MED ORDER — IPRATROPIUM-ALBUTEROL 0.5-2.5 (3) MG/3ML IN SOLN
RESPIRATORY_TRACT | Status: AC
  Filled 2017-11-27: qty 6

## 2017-11-27 MED ORDER — METHYLPREDNISOLONE SODIUM SUCC 125 MG IJ SOLR
INTRAMUSCULAR | Status: AC
  Administered 2017-11-27: 125 mg via INTRAVENOUS
  Filled 2017-11-27: qty 2

## 2017-11-27 MED ORDER — NICOTINE 21 MG/24HR TD PT24
21.0000 mg | MEDICATED_PATCH | Freq: Every day | TRANSDERMAL | Status: DC
  Administered 2017-11-27 – 2017-12-03 (×7): 21 mg via TRANSDERMAL
  Filled 2017-11-27 (×7): qty 1

## 2017-11-27 MED ORDER — PREDNISONE 20 MG PO TABS: 10.0000 mg | ORAL_TABLET | Freq: Every day | ORAL | Status: DC

## 2017-11-27 MED ORDER — ALBUTEROL SULFATE (2.5 MG/3ML) 0.083% IN NEBU
2.5000 mg | INHALATION_SOLUTION | RESPIRATORY_TRACT | Status: DC
  Administered 2017-11-27 – 2017-12-01 (×22): 2.5 mg via RESPIRATORY_TRACT
  Filled 2017-11-27 (×21): qty 3

## 2017-11-27 MED ORDER — HEPARIN SODIUM (PORCINE) 5000 UNIT/ML IJ SOLN
5000.0000 [IU] | Freq: Three times a day (TID) | INTRAMUSCULAR | Status: DC
  Administered 2017-11-28 – 2017-11-30 (×7): 5000 [IU] via SUBCUTANEOUS
  Filled 2017-11-27 (×7): qty 1

## 2017-11-27 MED ORDER — ALPRAZOLAM 0.25 MG PO TABS
0.2500 mg | ORAL_TABLET | Freq: Three times a day (TID) | ORAL | Status: DC | PRN
  Administered 2017-11-30 – 2017-12-02 (×2): 0.25 mg via ORAL
  Filled 2017-11-27 (×2): qty 1

## 2017-11-27 MED ORDER — AZITHROMYCIN 250 MG PO TABS
500.0000 mg | ORAL_TABLET | Freq: Every day | ORAL | Status: AC
  Administered 2017-11-27: 500 mg via ORAL
  Filled 2017-11-27: qty 2

## 2017-11-27 MED ORDER — PREDNISONE 20 MG PO TABS: 30.0000 mg | ORAL_TABLET | Freq: Every day | ORAL | Status: DC

## 2017-11-27 MED ORDER — ENOXAPARIN SODIUM 40 MG/0.4ML ~~LOC~~ SOLN
40.0000 mg | SUBCUTANEOUS | Status: DC
  Administered 2017-11-27: 40 mg via SUBCUTANEOUS
  Filled 2017-11-27: qty 0.4

## 2017-11-27 MED ORDER — GUAIFENESIN-DM 100-10 MG/5ML PO SYRP
15.0000 mL | ORAL_SOLUTION | ORAL | Status: DC | PRN
  Administered 2017-11-30: 15 mL via ORAL
  Filled 2017-11-27: qty 15

## 2017-11-27 MED ORDER — BUDESONIDE 0.25 MG/2ML IN SUSP
0.2500 mg | Freq: Two times a day (BID) | RESPIRATORY_TRACT | Status: DC
  Administered 2017-11-27 – 2017-12-03 (×13): 0.25 mg via RESPIRATORY_TRACT
  Filled 2017-11-27 (×13): qty 2

## 2017-11-27 MED ORDER — GUAIFENESIN ER 600 MG PO TB12
600.0000 mg | ORAL_TABLET | Freq: Two times a day (BID) | ORAL | Status: DC
  Administered 2017-11-27 – 2017-12-03 (×13): 600 mg via ORAL
  Filled 2017-11-27 (×13): qty 1

## 2017-11-27 MED ORDER — PREDNISONE 20 MG PO TABS: 40.0000 mg | ORAL_TABLET | Freq: Every day | ORAL | Status: DC

## 2017-11-27 MED ORDER — METHYLPREDNISOLONE SODIUM SUCC 125 MG IJ SOLR
60.0000 mg | Freq: Three times a day (TID) | INTRAMUSCULAR | Status: DC
  Administered 2017-11-27 – 2017-12-01 (×12): 60 mg via INTRAVENOUS
  Filled 2017-11-27 (×12): qty 2

## 2017-11-27 MED ORDER — LISINOPRIL 10 MG PO TABS
5.0000 mg | ORAL_TABLET | Freq: Every day | ORAL | Status: DC
  Administered 2017-11-27: 5 mg via ORAL
  Filled 2017-11-27 (×2): qty 1

## 2017-11-27 MED ORDER — PREDNISONE 50 MG PO TABS: 50.0000 mg | ORAL_TABLET | Freq: Every day | ORAL | Status: DC

## 2017-11-27 MED ORDER — MOMETASONE FURO-FORMOTEROL FUM 200-5 MCG/ACT IN AERO
2.0000 | INHALATION_SPRAY | Freq: Two times a day (BID) | RESPIRATORY_TRACT | Status: DC
  Filled 2017-11-27: qty 8.8

## 2017-11-27 MED ORDER — ACETAMINOPHEN 650 MG RE SUPP: 650.0000 mg | Freq: Four times a day (QID) | RECTAL | Status: DC | PRN

## 2017-11-27 MED ORDER — METHYLPREDNISOLONE SODIUM SUCC 125 MG IJ SOLR
125.0000 mg | Freq: Once | INTRAMUSCULAR | Status: AC
  Administered 2017-11-27: 125 mg via INTRAVENOUS

## 2017-11-27 MED ORDER — ONDANSETRON HCL 4 MG PO TABS: 4.0000 mg | ORAL_TABLET | Freq: Four times a day (QID) | ORAL | Status: DC | PRN

## 2017-11-27 MED ORDER — DOCUSATE SODIUM 100 MG PO CAPS
100.0000 mg | ORAL_CAPSULE | Freq: Two times a day (BID) | ORAL | Status: DC
  Administered 2017-11-27 – 2017-12-02 (×8): 100 mg via ORAL
  Filled 2017-11-27 (×12): qty 1

## 2017-11-27 MED ORDER — ADULT MULTIVITAMIN W/MINERALS CH
1.0000 | ORAL_TABLET | Freq: Every day | ORAL | Status: DC
  Administered 2017-11-27 – 2017-12-03 (×5): 1 via ORAL
  Filled 2017-11-27 (×7): qty 1

## 2017-11-27 MED ORDER — ONDANSETRON HCL 4 MG/2ML IJ SOLN
4.0000 mg | Freq: Four times a day (QID) | INTRAMUSCULAR | Status: DC | PRN
  Administered 2017-11-27 (×2): 4 mg via INTRAVENOUS
  Filled 2017-11-27 (×2): qty 2

## 2017-11-27 MED ORDER — IPRATROPIUM-ALBUTEROL 0.5-2.5 (3) MG/3ML IN SOLN
3.0000 mL | Freq: Once | RESPIRATORY_TRACT | Status: AC
  Administered 2017-11-27: 3 mL via RESPIRATORY_TRACT

## 2017-11-27 MED ORDER — PREDNISONE 20 MG PO TABS: 20.0000 mg | ORAL_TABLET | Freq: Every day | ORAL | Status: DC

## 2017-11-27 MED ORDER — TIOTROPIUM BROMIDE MONOHYDRATE 18 MCG IN CAPS
18.0000 ug | ORAL_CAPSULE | Freq: Every day | RESPIRATORY_TRACT | Status: DC
  Administered 2017-11-27 – 2017-12-03 (×7): 18 ug via RESPIRATORY_TRACT
  Filled 2017-11-27 (×2): qty 5

## 2017-11-27 MED ORDER — ACETAMINOPHEN 325 MG PO TABS: 650.0000 mg | ORAL_TABLET | Freq: Four times a day (QID) | ORAL | Status: DC | PRN

## 2017-11-27 MED ORDER — ENSURE ENLIVE PO LIQD
237.0000 mL | Freq: Two times a day (BID) | ORAL | Status: DC
  Administered 2017-11-27 – 2017-12-03 (×7): 237 mL via ORAL

## 2017-11-27 MED ORDER — AZITHROMYCIN 250 MG PO TABS
250.0000 mg | ORAL_TABLET | Freq: Every day | ORAL | Status: AC
  Administered 2017-11-28 – 2017-12-01 (×4): 250 mg via ORAL
  Filled 2017-11-27 (×4): qty 1

## 2017-11-27 NOTE — ED Triage Notes (Signed)
PT arrived via ems from home with complaints of shortness of breath. Pt took 1 albuterol treatment at home and ems administered 1 duo neb in route. Upon arrival pt's breathing labored but pt able to communicate.

## 2017-11-27 NOTE — Progress Notes (Signed)
Initial Nutrition Assessment  DOCUMENTATION CODES:   Severe malnutrition in context of chronic illness  INTERVENTION:   Ensure Enlive po BID, each supplement provides 350 kcal and 20 grams of protein  MVI with minerals  NUTRITION DIAGNOSIS:   Severe Malnutrition related to chronic illness as evidenced by severe fat depletion, severe muscle depletion  GOAL:   Patient will meet greater than or equal to 90% of their needs  MONITOR:   PO intake, I & O's, Labs, Supplement acceptance, Weight trends  REASON FOR ASSESSMENT:   Malnutrition Screening Tool    ASSESSMENT:   61 yo female with PMH Anxiety, COPD, HTN, presents with shortness of breath, respiratory failure.    Spoke with Ms. Ashley [name] at bedside. She reports normal PO intake PTA but says she isn't a big eater. Normally snacks on junk food throughout the day. Eats meat occassionally. UBW 78 pounds over the past 1-1.5 years. States she began to lose weight from 90 pounds with COPD but has never been a large woman according to her. Had some nausea today related to medications but otherwise feels ok. Meal completion 15% Agreeable to drinking ensure. Encouraged her to drink it to meet protein needs.  Medications reviewed and include: Colace, Solumedrol   NUTRITION - FOCUSED PHYSICAL EXAM:    Most Recent Value  Orbital Region  Severe depletion  Upper Arm Region  Severe depletion  Thoracic and Lumbar Region  Severe depletion  Buccal Region  Severe depletion  Temple Region  Severe depletion  Clavicle Bone Region  Severe depletion  Clavicle and Acromion Bone Region  Severe depletion  Scapular Bone Region  Severe depletion  Dorsal Hand  Severe depletion  Patellar Region  Severe depletion  Anterior Thigh Region  Severe depletion  Posterior Calf Region  Severe depletion  Edema (RD Assessment)  None  Hair  Reviewed  Eyes  Reviewed  Mouth  Reviewed  Skin  Reviewed  Nails  Reviewed       Diet Order:  Diet regular Room  service appropriate? Yes; Fluid consistency: Thin  EDUCATION NEEDS:   Not appropriate for education at this time  Skin:  Skin Assessment: Reviewed RN Assessment  Last BM:  11/26/2017  Height:   Ht Readings from Last 1 Encounters:  11/27/17 5\' 2"  (1.575 m)    Weight:   Wt Readings from Last 1 Encounters:  11/27/17 78 lb (35.4 kg)    Ideal Body Weight:  50 kg  BMI:  Body mass index is 14.27 kg/m.  Estimated Nutritional Needs:   Kcal:  1200-1400 calories  Protein:  53-60 grams (1.5-1.7g/kg)  Fluid:  >1.5L  Ashley AnoWilliam M. Nyjai Graff, MS, RD LDN Inpatient Clinical Dietitian Pager [phone]

## 2017-11-27 NOTE — Care Management (Signed)
Patient admitted with home with COPD exacerbation.  Patient self pay.  PCP Ashley [name], Ashley [name] at [facility]. Documented that patient has elevated toilet seat, shower seat, rollator, and nebulizer in the home.  Patient requiring 2 liters of O2.  RNCM following for discharge planning needs.

## 2017-11-27 NOTE — Progress Notes (Signed)
[employer] [facility] - [place] [facility]                                                                                                                                                                                  Patient Demographics   Ashley [name], is a 61 y.o. female, DOB - 02/14/1956, WUJ:[id number]  Admit date - 11/27/2017   Admitting Physician Arnaldo NatalMichael S Diamond, MD  Outpatient Primary MD for the patient is [name], [name], FNP   LOS - 0  Subjective: Patient admitted for shortness of breath she continues to be very short of breath also has cough    Review of Systems:   CONSTITUTIONAL: No documented fever. No fatigue, weakness. No weight gain, no weight loss.  EYES: No blurry or double vision.  ENT: No tinnitus. No postnasal drip. No redness of the oropharynx.  RESPIRATORY: Dry cough, no wheeze, no hemoptysis.  Positive dyspnea.  CARDIOVASCULAR: No chest pain. No orthopnea. No palpitations. No syncope.  GASTROINTESTINAL: No nausea, no vomiting or diarrhea. No abdominal pain. No melena or hematochezia.  GENITOURINARY: No dysuria or hematuria.  ENDOCRINE: No polyuria or nocturia. No heat or cold intolerance.  HEMATOLOGY: No anemia. No bruising. No bleeding.  INTEGUMENTARY: No rashes. No lesions.  MUSCULOSKELETAL: No arthritis. No swelling. No gout.  NEUROLOGIC: No numbness, tingling, or ataxia. No seizure-type activity.  PSYCHIATRIC: No anxiety. No insomnia. No ADD.    Vitals:   Vitals:   11/27/17 0816 11/27/17 1153 11/27/17 1358 11/27/17 1553  BP:   125/77   Pulse:   (!) 103   Resp:      Temp:   97.7 F (36.5 C)   TempSrc:   Oral   SpO2: 97% 94% 95% 95%  Weight:      Height:        Wt Readings from Last 3 Encounters:  11/27/17 78 lb (35.4 kg)  08/08/17 75 lb (34 kg)  07/09/17 73 lb 6.4 oz (33.3 kg)     Intake/Output Summary (Last 24 hours) at 11/27/2017 1801 Last data filed at 11/27/2017 1028 Gross per 24 hour  Intake 120 ml  Output -  Net 120  ml    Physical Exam:   GENERAL: Pleasant-appearing in mild respiratory distress.  HEAD, EYES, EARS, NOSE AND THROAT: Atraumatic, normocephalic. Extraocular muscles are intact. Pupils equal and reactive to light. Sclerae anicteric. No conjunctival injection. No oro-pharyngeal erythema.  NECK: Supple. There is no jugular venous distention. No bruits, no lymphadenopathy, no thyromegaly.  HEART: Regular rate and rhythm,. No murmurs, no rubs, no clicks.  LUNGS: Limited air entry with decreased breath sounds with some assist 3 muscle usage ABDOMEN: Soft, flat, nontender,  nondistended. Has good bowel sounds. No hepatosplenomegaly appreciated.  EXTREMITIES: No evidence of any cyanosis, clubbing, or peripheral edema.  +2 pedal and radial pulses bilaterally.  NEUROLOGIC: The patient is alert, awake, and oriented x3 with no focal motor or sensory deficits appreciated bilaterally.  SKIN: Moist and warm with no rashes appreciated.  Psych: Not anxious, depressed LN: No inguinal LN enlargement    Antibiotics   Anti-infectives (From admission, onward)   Start     Dose/Rate Route Frequency Ordered Stop   11/28/17 1000  azithromycin (ZITHROMAX) tablet 250 mg     250 mg Oral Daily 11/27/17 0619 12/02/17 0959   11/27/17 1000  azithromycin (ZITHROMAX) tablet 500 mg     500 mg Oral Daily 11/27/17 0619 11/27/17 1100      Medications   Scheduled Meds: . albuterol  2.5 mg Nebulization Q4H  . [START ON 11/28/2017] azithromycin  250 mg Oral Daily  . budesonide (PULMICORT) nebulizer solution  0.25 mg Nebulization BID  . docusate sodium  100 mg Oral BID  . feeding supplement (ENSURE ENLIVE)  237 mL Oral BID BM  . guaiFENesin  600 mg Oral BID  . [START ON 11/28/2017] heparin injection (subcutaneous)  5,000 Units Subcutaneous Q8H  . lisinopril  5 mg Oral Daily  . methylPREDNISolone (SOLU-MEDROL) injection  60 mg Intravenous Q8H  . multivitamin with minerals  1 tablet Oral Daily  . nicotine  21 mg  Transdermal Daily  . tiotropium  18 mcg Inhalation Daily   Continuous Infusions: PRN Meds:.acetaminophen **OR** acetaminophen, ALPRAZolam, guaiFENesin-dextromethorphan, ondansetron **OR** ondansetron (ZOFRAN) IV   Data Review:   Micro Results No results found for this or any previous visit (from the past 240 hour(s)).  Radiology Reports Dg Chest Portable 1 View  Result Date: 11/27/2017 CLINICAL DATA:  61 y/o  F; shortness of breath.  History of COPD. EXAM: PORTABLE CHEST 1 VIEW COMPARISON:  08/08/2017 chest radiograph FINDINGS: Normal cardiac silhouette. Hyperinflated lungs, emphysema, and biapical pleuroparenchymal scarring are stable. Aortic atherosclerosis with calcification. No focal consolidation. Bones are unremarkable. IMPRESSION: Stable COPD with severe emphysema and biapical pleuroparenchymal scarring. No focal consolidation. Electronically Signed   By: Mitzi Hansen M.D.   On: 11/27/2017 04:49     CBC Recent Labs  Lab 11/27/17 0407  WBC 5.1  HGB 14.5  HCT 42.3  PLT 188  MCV 88.9  MCH 30.5  MCHC 34.3  RDW 13.6  LYMPHSABS 1.4  MONOABS 0.4  EOSABS 0.1  BASOSABS 0.0    Chemistries  Recent Labs  Lab 11/27/17 0407  NA 140  K 3.7  CL 105  CO2 24  GLUCOSE 118*  BUN 13  CREATININE 0.54  CALCIUM 9.1  AST 21  ALT 12*  ALKPHOS 94  BILITOT 0.6   ------------------------------------------------------------------------------------------------------------------ estimated creatinine clearance is 40.2 mL/min (by C-G formula based on SCr of 0.54 mg/dL). ------------------------------------------------------------------------------------------------------------------ No results for input(s): HGBA1C in the last 72 hours. ------------------------------------------------------------------------------------------------------------------ No results for input(s): CHOL, HDL, LDLCALC, TRIG, CHOLHDL, LDLDIRECT in the last 72  hours. ------------------------------------------------------------------------------------------------------------------ Recent Labs    11/27/17 0407  TSH 4.178   ------------------------------------------------------------------------------------------------------------------ No results for input(s): VITAMINB12, FOLATE, FERRITIN, TIBC, IRON, RETICCTPCT in the last 72 hours.  Coagulation profile No results for input(s): INR, PROTIME in the last 168 hours.  No results for input(s): DDIMER in the last 72 hours.  Cardiac Enzymes Recent Labs  Lab 11/27/17 0407  TROPONINI <0.03   ------------------------------------------------------------------------------------------------------------------ Invalid input(s): POCBNP    Assessment & Plan   This  is a 61 year old female admitted for respiratory failure.  1.  Acute on chronic respiratory failure: This is due to acute on chronic COPD exasperation as well as acute bronchitis.  Patient has been only placed on prednisone but she is very symptomatic I will discontinue prednisone place her on IV Solu-Medrol.  I will discontinue her Dulera and start patient on Pulmicort.  Continue nebulizer therapy , and Spiriva Continue azithromycin for bronchitis Mucinex for mucolytic purposes Antitussive medications are added for cough 2. Hypertension: controlled; continue lisinopril 3. Tobacco abuse: I have counseled patient regarding smoking cessation 4 minutes spent I strongly recommend patient stop smoking I have started on a nicotine patch 4. Underweight: BMI 14.2; secondary to increased metabolism for work of breathing. Encourage calorie supplementation. 5. DVT prophylaxis: Lovenox 6. GI prophylaxis: none       Code Status Orders  (From admission, onward)        Start     Ordered   11/27/17 0620  Full code  Continuous     11/27/17 0619    Code Status History    Date Active Date Inactive Code Status Order ID Comments User Context    07/08/2017 18:25 07/10/2017 12:49 Full Code [id number]  Altamese DillingVachhani, Vaibhavkumar, MD Inpatient   02/17/2017 14:25 02/19/2017 16:29 Full Code [id number]  Enid BaasKalisetti, Radhika, MD Inpatient   06/01/2016 02:00 06/02/2016 18:06 Full Code [id number]  Gery Prayrosley, Debby, MD Inpatient   09/30/2015 16:04 10/05/2015 15:50 Full Code [id number]  Altamese DillingVachhani, Vaibhavkumar, MD ED   09/22/2015 17:47 09/24/2015 14:04 Full Code [id number]  Enid BaasKalisetti, Radhika, MD Inpatient           Consults none  DVT Prophylaxis  Lovenox Lab Results  Component Value Date   PLT 188 11/27/2017     Time Spent in minutes   45 minutes greater than 50% of time spent in care coordination and counseling patient regarding the condition and plan of care.   Auburn BilberryPATEL, Mystery Schrupp M.D on 11/27/2017 at 6:01 PM  Between 7am to 6pm - Pager - [phone]  After 6pm go to [email or web address] - password [email or web address] [facility]  [facility]   Office  [phone]

## 2017-11-27 NOTE — H&P (Signed)
Ashley [name] is an 61 y.o. female.   Chief Complaint: Shortness of breath HPI: The patient with past medical history of COPD and hypertension presents to the Emergency Department complaining of shortness of breath. She states that she has become progressively more dyspneic over the last few days. She has had a severe cough that is occassionally productive of thick white phlegm. She has been using her inhalers more than usual as well. In the emergency department she was found to have oxygen saturations of 86% on room air. She was placed on supplemental oxygen via nasal cannula. Her work of breathing also improved. She received Solumedrol and multiple breathing treatments before the emergency department staff called the hospitalist service for admission.  Past Medical History:  Diagnosis Date  . Anxiety   . COPD (chronic obstructive pulmonary disease) ([place])   . Hypertension     Past Surgical History:  Procedure Laterality Date  . ABDOMINAL HYSTERECTOMY      Family History  Problem Relation Age of Onset  . CAD Mother   . CAD Father   . Cancer - Prostate Brother   . Cancer - Lung Brother    Social History:  reports that she has been smoking cigarettes.  She has been smoking about 0.50 packs per day. she has never used smokeless tobacco. She reports that she does not drink alcohol or use drugs.  Allergies:  Allergies  Allergen Reactions  . Codeine Nausea Only    Medications Prior to Admission  Medication Sig Dispense Refill  . albuterol (PROAIR HFA) 108 (90 Base) MCG/ACT inhaler Inhale 2 puffs into the lungs every 6 (six) hours as needed for wheezing or shortness of breath. 1 Inhaler 1  . albuterol (PROVENTIL) (5 MG/ML) 0.5% nebulizer solution Take 2.5 mg by nebulization every 4 (four) hours as needed for wheezing or shortness of breath.    . lisinopril (PRINIVIL,ZESTRIL) 5 MG tablet Take 5 mg by mouth daily.    Marland Kitchen tiotropium (SPIRIVA) 18 MCG inhalation capsule Place 18 mcg into inhaler  and inhale daily.    . feeding supplement, ENSURE ENLIVE, (ENSURE ENLIVE) LIQD Take 237 mLs by mouth 3 (three) times daily between meals. 90 Bottle 1  . promethazine (PHENERGAN) 12.5 MG tablet Take 1 tablet (12.5 mg total) by mouth every 6 (six) hours as needed for nausea or vomiting. (Patient not taking: Reported on 08/08/2017) 30 tablet 0    Results for orders placed or performed during the hospital encounter of 11/27/17 (from the past 48 hour(s))  CBC with Differential     Status: None   Collection Time: 11/27/17  4:07 AM  Result Value Ref Range   WBC 5.1 3.6 - 11.0 K/uL   RBC 4.76 3.80 - 5.20 MIL/uL   Hemoglobin 14.5 12.0 - 16.0 g/dL   HCT 42.3 35.0 - 47.0 %   MCV 88.9 80.0 - 100.0 fL   MCH 30.5 26.0 - 34.0 pg   MCHC 34.3 32.0 - 36.0 g/dL   RDW 13.6 11.5 - 14.5 %   Platelets 188 150 - 440 K/uL   Neutrophils Relative % 61 %   Neutro Abs 3.1 1.4 - 6.5 K/uL   Lymphocytes Relative 28 %   Lymphs Abs 1.4 1.0 - 3.6 K/uL   Monocytes Relative 8 %   Monocytes Absolute 0.4 0.2 - 0.9 K/uL   Eosinophils Relative 2 %   Eosinophils Absolute 0.1 0 - 0.7 K/uL   Basophils Relative 1 %   Basophils Absolute 0.0 0 -  0.1 K/uL  Comprehensive metabolic panel     Status: Abnormal   Collection Time: 11/27/17  4:07 AM  Result Value Ref Range   Sodium 140 135 - 145 mmol/L   Potassium 3.7 3.5 - 5.1 mmol/L   Chloride 105 101 - 111 mmol/L   CO2 24 22 - 32 mmol/L   Glucose, Bld 118 (H) 65 - 99 mg/dL   BUN 13 6 - 20 mg/dL   Creatinine, Ser 0.54 0.44 - 1.00 mg/dL   Calcium 9.1 8.9 - 10.3 mg/dL   Total Protein 6.2 (L) 6.5 - 8.1 g/dL   Albumin 4.1 3.5 - 5.0 g/dL   AST 21 15 - 41 U/L   ALT 12 (L) 14 - 54 U/L   Alkaline Phosphatase 94 38 - 126 U/L   Total Bilirubin 0.6 0.3 - 1.2 mg/dL   GFR calc non Af Amer >60 >60 mL/min   GFR calc Af Amer >60 >60 mL/min    Comment: (NOTE) The eGFR has been calculated using the CKD EPI equation. This calculation has not been validated in all clinical  situations. eGFR's persistently <60 mL/min signify possible Chronic Kidney Disease.    Anion gap 11 5 - 15  Brain natriuretic peptide     Status: None   Collection Time: 11/27/17  4:07 AM  Result Value Ref Range   B Natriuretic Peptide 45.0 0.0 - 100.0 pg/mL  Troponin I     Status: None   Collection Time: 11/27/17  4:07 AM  Result Value Ref Range   Troponin I <0.03 <0.03 ng/mL  TSH     Status: None   Collection Time: 11/27/17  4:07 AM  Result Value Ref Range   TSH 4.178 0.350 - 4.500 uIU/mL    Comment: Performed by a 3rd Generation assay with a functional sensitivity of <=0.01 uIU/mL.   Dg Chest Portable 1 View  Result Date: 11/27/2017 CLINICAL DATA:  61 y/o  F; shortness of breath.  History of COPD. EXAM: PORTABLE CHEST 1 VIEW COMPARISON:  08/08/2017 chest radiograph FINDINGS: Normal cardiac silhouette. Hyperinflated lungs, emphysema, and biapical pleuroparenchymal scarring are stable. Aortic atherosclerosis with calcification. No focal consolidation. Bones are unremarkable. IMPRESSION: Stable COPD with severe emphysema and biapical pleuroparenchymal scarring. No focal consolidation. Electronically Signed   By: Kristine Garbe M.D.   On: 11/27/2017 04:49    Review of Systems  Constitutional: Negative for chills and fever.  HENT: Negative for sore throat and tinnitus.   Eyes: Negative for blurred vision and redness.  Respiratory: Positive for cough and shortness of breath.   Cardiovascular: Negative for chest pain, palpitations, orthopnea and PND.  Gastrointestinal: Negative for abdominal pain, diarrhea, nausea and vomiting.  Genitourinary: Negative for dysuria, frequency and urgency.  Musculoskeletal: Negative for joint pain and myalgias.  Skin: Negative for rash.       No lesions  Neurological: Negative for speech change, focal weakness and weakness.  Endo/Heme/Allergies: Does not bruise/bleed easily.       No temperature intolerance  Psychiatric/Behavioral: Negative  for depression and suicidal ideas.    Blood pressure 129/63, pulse 81, temperature 98.5 F (36.9 C), temperature source Oral, resp. rate 16, height _0  (1.575 m), weight 35.4 kg (78 lb), SpO2 97 %. Physical Exam  Vitals reviewed. Constitutional: She is oriented to person, place, and time. She appears well-developed and well-nourished. No distress.  HENT:  Head: Normocephalic and atraumatic.  Mouth/Throat: Oropharynx is clear and moist.  Eyes: Conjunctivae and EOM are normal. Pupils are  equal, round, and reactive to light. No scleral icterus.  Neck: Normal range of motion. No tracheal deviation present. No thyromegaly present.  Cardiovascular: Normal rate, regular rhythm and normal heart sounds. Exam reveals no gallop and no friction rub.  No murmur heard. Respiratory: Effort normal and breath sounds normal.  GI: Soft. Bowel sounds are normal. She exhibits no distension. There is no tenderness.  Genitourinary:  Genitourinary Comments: Deferred  Lymphadenopathy:    She has no cervical adenopathy.  Neurological: She is alert and oriented to person, place, and time. No cranial nerve deficit. She exhibits normal muscle tone.  Skin: Skin is warm and dry. No rash noted. No erythema.  Psychiatric: She has a normal mood and affect. Her behavior is normal. Judgment and thought content normal.     Assessment/Plan This is a 61 year old female admitted for respiratory failure.  1. Respiratory failure:: acute on chronic; with hypoxia. Secondary to COPD exacerbation. The patient has received steroids which we will taper. Azithromycin for anti-inflammatory effect. Supplemental O2 as needed. Add inhaled corticosteroid. Continue Spiriva. 2. Hypertension: controlled; continue lisinopril 3. Tobacco abuse: counseled on smoking cessation 4. Underweight: BMI 14.2; secondary to increased metabolism for work of breathing. Encourage calorie supplementation. 5. DVT prophylaxis: Lovenox 6. GI prophylaxis:  none The patient is a FULL CODE. Time spent on admission orders and patient care approximately 45 minutes.  Harrie Foreman, MD 11/27/2017, 7:37 AM

## 2017-11-28 NOTE — Progress Notes (Signed)
[employer] - [place] at [facility]                                                                                                                                                                                  Patient Demographics   Ashley [name], is a 61 y.o. female, DOB - [birth year], ZOX:[id number]  Admit date - 11/27/2017   Admitting Physician Arnaldo NatalMichael S Diamond, MD  Outpatient Primary MD for the patient is [name], [name], FNP   LOS - 1  Subjective: Patient is very anxious still complains of shortness of breath blood pressure was lower earlier    Review of Systems:   CONSTITUTIONAL: No documented fever. No fatigue, weakness. No weight gain, no weight loss.  EYES: No blurry or double vision.  ENT: No tinnitus. No postnasal drip. No redness of the oropharynx.  RESPIRATORY: Dry cough, no wheeze, no hemoptysis.  Positive dyspnea.  CARDIOVASCULAR: No chest pain. No orthopnea. No palpitations. No syncope.  GASTROINTESTINAL: No nausea, no vomiting or diarrhea. No abdominal pain. No melena or hematochezia.  GENITOURINARY: No dysuria or hematuria.  ENDOCRINE: No polyuria or nocturia. No heat or cold intolerance.  HEMATOLOGY: No anemia. No bruising. No bleeding.  INTEGUMENTARY: No rashes. No lesions.  MUSCULOSKELETAL: No arthritis. No swelling. No gout.  NEUROLOGIC: No numbness, tingling, or ataxia. No seizure-type activity.  PSYCHIATRIC: Positive anxiety. No insomnia. No ADD.    Vitals:   Vitals:   11/28/17 0815 11/28/17 1057 11/28/17 1341 11/28/17 1635  BP:  (!) 109/53 (!) 101/45   Pulse:   90   Resp:   15   Temp:   98.6 F (37 C)   TempSrc:   Oral   SpO2: 98%  99% 99%  Weight:      Height:        Wt Readings from Last 3 Encounters:  11/28/17 72 lb 14.4 oz (33.1 kg)  08/08/17 75 lb (34 kg)  07/09/17 73 lb 6.4 oz (33.3 kg)     Intake/Output Summary (Last 24 hours) at 11/28/2017 1722 Last data filed at 11/28/2017 1023 Gross per 24 hour  Intake 120 ml   Output 300 ml  Net -180 ml    Physical Exam:   GENERAL: Pleasant-appearing in mild respiratory distress.  HEAD, EYES, EARS, NOSE AND THROAT: Atraumatic, normocephalic. Extraocular muscles are intact. Pupils equal and reactive to light. Sclerae anicteric. No conjunctival injection. No oro-pharyngeal erythema.  NECK: Supple. There is no jugular venous distention. No bruits, no lymphadenopathy, no thyromegaly.  HEART: Regular rate and rhythm,. No murmurs, no rubs, no clicks.  LUNGS: Limited air entry with decreased breath sounds with some assist 3 muscle usage ABDOMEN: Soft,  flat, nontender, nondistended. Has good bowel sounds. No hepatosplenomegaly appreciated.  EXTREMITIES: No evidence of any cyanosis, clubbing, or peripheral edema.  +2 pedal and radial pulses bilaterally.  NEUROLOGIC: The patient is alert, awake, and oriented x3 with no focal motor or sensory deficits appreciated bilaterally.  SKIN: Moist and warm with no rashes appreciated.  Psych: Not anxious, depressed LN: No inguinal LN enlargement    Antibiotics   Anti-infectives (From admission, onward)   Start     Dose/Rate Route Frequency Ordered Stop   11/28/17 1000  azithromycin (ZITHROMAX) tablet 250 mg     250 mg Oral Daily 11/27/17 0619 12/02/17 0959   11/27/17 1000  azithromycin (ZITHROMAX) tablet 500 mg     500 mg Oral Daily 11/27/17 0619 11/27/17 1100      Medications   Scheduled Meds: . albuterol  2.5 mg Nebulization Q4H  . azithromycin  250 mg Oral Daily  . budesonide (PULMICORT) nebulizer solution  0.25 mg Nebulization BID  . docusate sodium  100 mg Oral BID  . feeding supplement (ENSURE ENLIVE)  237 mL Oral BID BM  . guaiFENesin  600 mg Oral BID  . heparin injection (subcutaneous)  5,000 Units Subcutaneous Q8H  . methylPREDNISolone (SOLU-MEDROL) injection  60 mg Intravenous Q8H  . multivitamin with minerals  1 tablet Oral Daily  . nicotine  21 mg Transdermal Daily  . tiotropium  18 mcg Inhalation Daily    Continuous Infusions: PRN Meds:.acetaminophen **OR** acetaminophen, ALPRAZolam, guaiFENesin-dextromethorphan, ondansetron **OR** ondansetron (ZOFRAN) IV   Data Review:   Micro Results No results found for this or any previous visit (from the past 240 hour(s)).  Radiology Reports Dg Chest Portable 1 View  Result Date: 11/27/2017 CLINICAL DATA:  61 y/o  F; shortness of breath.  History of COPD. EXAM: PORTABLE CHEST 1 VIEW COMPARISON:  08/08/2017 chest radiograph FINDINGS: Normal cardiac silhouette. Hyperinflated lungs, emphysema, and biapical pleuroparenchymal scarring are stable. Aortic atherosclerosis with calcification. No focal consolidation. Bones are unremarkable. IMPRESSION: Stable COPD with severe emphysema and biapical pleuroparenchymal scarring. No focal consolidation. Electronically Signed   By: Mitzi HansenLance  Furusawa-Stratton M.D.   On: 11/27/2017 04:49     CBC Recent Labs  Lab 11/27/17 0407  WBC 5.1  HGB 14.5  HCT 42.3  PLT 188  MCV 88.9  MCH 30.5  MCHC 34.3  RDW 13.6  LYMPHSABS 1.4  MONOABS 0.4  EOSABS 0.1  BASOSABS 0.0    Chemistries  Recent Labs  Lab 11/27/17 0407  NA 140  K 3.7  CL 105  CO2 24  GLUCOSE 118*  BUN 13  CREATININE 0.54  CALCIUM 9.1  AST 21  ALT 12*  ALKPHOS 94  BILITOT 0.6   ------------------------------------------------------------------------------------------------------------------ estimated creatinine clearance is 37.6 mL/min (by C-G formula based on SCr of 0.54 mg/dL). ------------------------------------------------------------------------------------------------------------------ No results for input(s): HGBA1C in the last 72 hours. ------------------------------------------------------------------------------------------------------------------ No results for input(s): CHOL, HDL, LDLCALC, TRIG, CHOLHDL, LDLDIRECT in the last 72  hours. ------------------------------------------------------------------------------------------------------------------ Recent Labs    11/27/17 0407  TSH 4.178   ------------------------------------------------------------------------------------------------------------------ No results for input(s): VITAMINB12, FOLATE, FERRITIN, TIBC, IRON, RETICCTPCT in the last 72 hours.  Coagulation profile No results for input(s): INR, PROTIME in the last 168 hours.  No results for input(s): DDIMER in the last 72 hours.  Cardiac Enzymes Recent Labs  Lab 11/27/17 0407  TROPONINI <0.03   ------------------------------------------------------------------------------------------------------------------ Invalid input(s): POCBNP    Assessment & Plan   This is a 61 year old female admitted for respiratory failure.  1.  Acute on chronic respiratory failure: This is due to acute on chronic COPD exasperation as well as acute bronchitis.  Continue therapy with IV Solu-Medrol, nebulizer therapy, oral azithromycin and Pulmicort, Mucinex 2. Hypertension: Blood pressure was lower earlier discontinue lisinopril 3. Tobacco abuse: I have counseled patient regarding smoking cessation  4. Underweight: BMI 14.2; secondary to increased metabolism for work of breathing. Encourage calorie supplementation. 5. DVT prophylaxis: Lovenox 6. GI prophylaxis: none       Code Status Orders  (From admission, onward)        Start     Ordered   11/27/17 0620  Full code  Continuous     11/27/17 0619    Code Status History    Date Active Date Inactive Code Status Order ID Comments User Context   07/08/2017 18:25 07/10/2017 12:49 Full Code [id number]  Altamese Dilling, MD Inpatient   02/17/2017 14:25 02/19/2017 16:29 Full Code [id number]  Enid Baas, MD Inpatient   06/01/2016 02:00 06/02/2016 18:06 Full Code [id number]  Gery Pray, MD Inpatient   09/30/2015 16:04 10/05/2015 15:50 Full Code [id number]   Altamese Dilling, MD ED   09/22/2015 17:47 09/24/2015 14:04 Full Code [id number]  Enid Baas, MD Inpatient           Consults none  DVT Prophylaxis  Lovenox Lab Results  Component Value Date   PLT 188 11/27/2017     Time Spent in minutes   35 minutes greater than 50% of time spent in care coordination and counseling patient regarding the condition and plan of care.   Auburn Bilberry M.D on 11/28/2017 at 5:22 PM  Between 7am to 6pm - Pager - [phone]  After 6pm go to [email or web address] - password [email or web address] [facility]  [facility]   Office  [phone]

## 2017-11-28 NOTE — Progress Notes (Signed)
Called Dr. Tobi BastosPyreddy regarding patient's soft blood pressures  (96/43 monitor, (98/58) manual.  Will continue to monitor per doctor.  Arturo MortonClay, Nickole Adamek N  11/28/2017  6:12 AM

## 2017-11-28 NOTE — Plan of Care (Signed)
Patient progressing and receiving breathing treatments.  Ashley [name], Ashley [name]

## 2017-11-29 LAB — CORTISOL: Cortisol, Plasma: 3.5 ug/dL

## 2017-11-29 MED ORDER — SODIUM CHLORIDE 0.9 % IV SOLN
INTRAVENOUS | Status: DC
Start: 2017-11-29 — End: 2017-11-30
  Administered 2017-11-29 – 2017-11-30 (×2): via INTRAVENOUS

## 2017-11-29 MED ORDER — INFLUENZA VAC SPLIT QUAD 0.5 ML IM SUSY: 0.5000 mL | PREFILLED_SYRINGE | INTRAMUSCULAR | Status: DC

## 2017-11-29 NOTE — Progress Notes (Signed)
[employer] - [place] at [facility]                                                                                                                                                                                  Patient Demographics   Ashley [name], is a 61 y.o. female, DOB - Jan 22, 1956, ZOX:[id number]  Admit date - 11/27/2017   Admitting Physician Arnaldo Natal, MD  Outpatient Primary MD for the patient is Toy Cookey, FNP   LOS - 2  Subjective: [name] blood pressure is little low breathing continues to be labile   Review of Systems:   CONSTITUTIONAL: No documented fever. No fatigue, weakness. No weight gain, no weight loss.  EYES: No blurry or double vision.  ENT: No tinnitus. No postnasal drip. No redness of the oropharynx.  RESPIRATORY: Dry cough, no wheeze, no hemoptysis.  Positive dyspnea.  CARDIOVASCULAR: No chest pain. No orthopnea. No palpitations. No syncope.  GASTROINTESTINAL: No nausea, no vomiting or diarrhea. No abdominal pain. No melena or hematochezia.  GENITOURINARY: No dysuria or hematuria.  ENDOCRINE: No polyuria or nocturia. No heat or cold intolerance.  HEMATOLOGY: No anemia. No bruising. No bleeding.  INTEGUMENTARY: No rashes. No lesions.  MUSCULOSKELETAL: No arthritis. No swelling. No gout.  NEUROLOGIC: No numbness, tingling, or ataxia. No seizure-type activity.  PSYCHIATRIC: Positive anxiety. No insomnia. No ADD.    Vitals:   Vitals:   11/29/17 0408 11/29/17 0446 11/29/17 0500 11/29/17 1557  BP: (!) 96/50     Pulse: 84     Resp:      Temp: (!) 97.4 F (36.3 C)     TempSrc: Oral     SpO2: 100% 100%  97%  Weight:   73 lb 6.4 oz (33.3 kg)   Height:        Wt Readings from Last 3 Encounters:  11/29/17 73 lb 6.4 oz (33.3 kg)  08/08/17 75 lb (34 kg)  07/09/17 73 lb 6.4 oz (33.3 kg)     Intake/Output Summary (Last 24 hours) at 11/29/2017 1637 Last data filed at 11/29/2017 0900 Gross per 24 hour  Intake 480 ml  Output 900  ml  Net -420 ml    Physical Exam:   GENERAL: Pleasant-appearing in mild respiratory distress.  HEAD, EYES, EARS, NOSE AND THROAT: Atraumatic, normocephalic. Extraocular muscles are intact. Pupils equal and reactive to light. Sclerae anicteric. No conjunctival injection. No oro-pharyngeal erythema.  NECK: Supple. There is no jugular venous distention. No bruits, no lymphadenopathy, no thyromegaly.  HEART: Regular rate and rhythm,. No murmurs, no rubs, no clicks.  LUNGS: Limited air entry with decreased breath sounds with some assist 3 muscle usage ABDOMEN: Soft,  flat, nontender, nondistended. Has good bowel sounds. No hepatosplenomegaly appreciated.  EXTREMITIES: No evidence of any cyanosis, clubbing, or peripheral edema.  +2 pedal and radial pulses bilaterally.  NEUROLOGIC: The patient is alert, awake, and oriented x3 with no focal motor or sensory deficits appreciated bilaterally.  SKIN: Moist and warm with no rashes appreciated.  Psych: Not anxious, depressed LN: No inguinal LN enlargement    Antibiotics   Anti-infectives (From admission, onward)   Start     Dose/Rate Route Frequency Ordered Stop   11/28/17 1000  azithromycin (ZITHROMAX) tablet 250 mg     250 mg Oral Daily 11/27/17 0619 12/02/17 0959   11/27/17 1000  azithromycin (ZITHROMAX) tablet 500 mg     500 mg Oral Daily 11/27/17 0619 11/27/17 1100      Medications   Scheduled Meds: . albuterol  2.5 mg Nebulization Q4H  . azithromycin  250 mg Oral Daily  . budesonide (PULMICORT) nebulizer solution  0.25 mg Nebulization BID  . docusate sodium  100 mg Oral BID  . feeding supplement (ENSURE ENLIVE)  237 mL Oral BID BM  . guaiFENesin  600 mg Oral BID  . heparin injection (subcutaneous)  5,000 Units Subcutaneous Q8H  . [START ON 11/30/2017] Influenza vac split quadrivalent PF  0.5 mL Intramuscular Tomorrow-1000  . methylPREDNISolone (SOLU-MEDROL) injection  60 mg Intravenous Q8H  . multivitamin with minerals  1 tablet Oral  Daily  . nicotine  21 mg Transdermal Daily  . tiotropium  18 mcg Inhalation Daily   Continuous Infusions: . sodium chloride 50 mL/hr at 11/29/17 1117   PRN Meds:.acetaminophen **OR** acetaminophen, ALPRAZolam, guaiFENesin-dextromethorphan, ondansetron **OR** ondansetron (ZOFRAN) IV   Data Review:   Micro Results No results found for this or any previous visit (from the past 240 hour(s)).  Radiology Reports Dg Chest Portable 1 View  Result Date: 11/27/2017 CLINICAL DATA:  61 y/o  F; shortness of breath.  History of COPD. EXAM: PORTABLE CHEST 1 VIEW COMPARISON:  08/08/2017 chest radiograph FINDINGS: Normal cardiac silhouette. Hyperinflated lungs, emphysema, and biapical pleuroparenchymal scarring are stable. Aortic atherosclerosis with calcification. No focal consolidation. Bones are unremarkable. IMPRESSION: Stable COPD with severe emphysema and biapical pleuroparenchymal scarring. No focal consolidation. Electronically Signed   By: Mitzi HansenLance  Furusawa-Stratton M.D.   On: 11/27/2017 04:49     CBC Recent Labs  Lab 11/27/17 0407  WBC 5.1  HGB 14.5  HCT 42.3  PLT 188  MCV 88.9  MCH 30.5  MCHC 34.3  RDW 13.6  LYMPHSABS 1.4  MONOABS 0.4  EOSABS 0.1  BASOSABS 0.0    Chemistries  Recent Labs  Lab 11/27/17 0407  NA 140  K 3.7  CL 105  CO2 24  GLUCOSE 118*  BUN 13  CREATININE 0.54  CALCIUM 9.1  AST 21  ALT 12*  ALKPHOS 94  BILITOT 0.6   ------------------------------------------------------------------------------------------------------------------ estimated creatinine clearance is 37.8 mL/min (by C-G formula based on SCr of 0.54 mg/dL). ------------------------------------------------------------------------------------------------------------------ No results for input(s): HGBA1C in the last 72 hours. ------------------------------------------------------------------------------------------------------------------ No results for input(s): CHOL, HDL, LDLCALC, TRIG,  CHOLHDL, LDLDIRECT in the last 72 hours. ------------------------------------------------------------------------------------------------------------------ Recent Labs    11/27/17 0407  TSH 4.178   ------------------------------------------------------------------------------------------------------------------ No results for input(s): VITAMINB12, FOLATE, FERRITIN, TIBC, IRON, RETICCTPCT in the last 72 hours.  Coagulation profile No results for input(s): INR, PROTIME in the last 168 hours.  No results for input(s): DDIMER in the last 72 hours.  Cardiac Enzymes Recent Labs  Lab 11/27/17 0407  TROPONINI <0.03   ------------------------------------------------------------------------------------------------------------------  Invalid input(s): POCBNP    Assessment & Plan   This is a 61 year old female admitted for respiratory failure.  1.  Acute on chronic respiratory failure: This is due to acute on chronic COPD exasperation as well as acute bronchitis.  Continue therapy with IV Solu-Medrol, nebulizer therapy, oral azithromycin and Pulmicort, Mucinex-slow to improve 2.  Hypotension patient's blood pressure is low I will check a random cortisol level, give her some IV fluids, we will also review her echocardiogram from Dr. Shanda BumpsKohn's office I have asked him to submit this to us for review, may need midodrine 3. Tobacco abuse: I have counseled patient regarding smoking cessation  4. Underweight: BMI 14.2; secondary to increased metabolism for work of breathing. Encourage calorie supplementation. 5. DVT prophylaxis: Lovenox 6. GI prophylaxis: none       Code Status Orders  (From admission, onward)        Start     Ordered   11/27/17 0620  Full code  Continuous     11/27/17 0619    Code Status History    Date Active Date Inactive Code Status Order ID Comments User Context   07/08/2017 18:25 07/10/2017 12:49 Full Code [id number]  Altamese DillingVachhani, Vaibhavkumar, MD Inpatient   02/17/2017  14:25 02/19/2017 16:29 Full Code [id number]  Enid BaasKalisetti, Radhika, MD Inpatient   06/01/2016 02:00 06/02/2016 18:06 Full Code [id number]  Gery Prayrosley, Debby, MD Inpatient   09/30/2015 16:04 10/05/2015 15:50 Full Code [id number]  Altamese DillingVachhani, Vaibhavkumar, MD ED   09/22/2015 17:47 09/24/2015 14:04 Full Code [id number]  Enid BaasKalisetti, Radhika, MD Inpatient           Consults none  DVT Prophylaxis  Lovenox Lab Results  Component Value Date   PLT 188 11/27/2017     Time Spent in minutes   32 minutes greater than 50% of time spent in care coordination and counseling patient regarding the condition and plan of care.   Auburn BilberryPATEL, Eddrick Dilone M.D on 11/29/2017 at 4:37 PM  Between 7am to 6pm - Pager - [phone]  After 6pm go to [email or web address] - password [email or web address] [facility]  [facility]   Office  [phone]

## 2017-11-29 NOTE — Care Management (Signed)
Confirmed with Jermaine from [facility] that patient has home O2 through them.  She has been purchasing portable tanks out of pocket.

## 2017-11-30 ENCOUNTER — Encounter: Payer: Self-pay | Admitting: Internal Medicine

## 2017-11-30 LAB — CBC
HEMATOCRIT: 37.1 % (ref 35.0–47.0)
HEMOGLOBIN: 12.6 g/dL (ref 12.0–16.0)
MCH: 31 pg (ref 26.0–34.0)
MCHC: 33.9 g/dL (ref 32.0–36.0)
MCV: 91.3 fL (ref 80.0–100.0)
Platelets: 171 10*3/uL (ref 150–440)
RBC: 4.06 MIL/uL (ref 3.80–5.20)
RDW: 14.1 % (ref 11.5–14.5)
WBC: 7.2 10*3/uL (ref 3.6–11.0)

## 2017-11-30 LAB — URINALYSIS, COMPLETE (UACMP) WITH MICROSCOPIC
BACTERIA UA: NONE SEEN
BILIRUBIN URINE: NEGATIVE
Glucose, UA: NEGATIVE mg/dL
HGB URINE DIPSTICK: NEGATIVE
Ketones, ur: NEGATIVE mg/dL
NITRITE: NEGATIVE
PROTEIN: NEGATIVE mg/dL
SPECIFIC GRAVITY, URINE: 1.023 (ref 1.005–1.030)
pH: 5 (ref 5.0–8.0)

## 2017-11-30 LAB — BASIC METABOLIC PANEL
ANION GAP: 5 (ref 5–15)
BUN: 20 mg/dL (ref 6–20)
CHLORIDE: 105 mmol/L (ref 101–111)
CO2: 31 mmol/L (ref 22–32)
Calcium: 8.8 mg/dL — ABNORMAL LOW (ref 8.9–10.3)
Creatinine, Ser: 0.49 mg/dL (ref 0.44–1.00)
GFR calc Af Amer: >60 mL/min (ref >60)
GLUCOSE: 134 mg/dL — AB (ref 65–99)
POTASSIUM: 4.6 mmol/L (ref 3.5–5.1)
Sodium: 141 mmol/L (ref 135–145)

## 2017-11-30 MED ORDER — APIXABAN 5 MG PO TABS
5.0000 mg | ORAL_TABLET | Freq: Two times a day (BID) | ORAL | Status: DC
  Administered 2017-11-30 – 2017-12-03 (×7): 5 mg via ORAL
  Filled 2017-11-30 (×7): qty 1

## 2017-11-30 MED ORDER — AMIODARONE LOAD VIA INFUSION
150.0000 mg | Freq: Once | INTRAVENOUS | Status: AC
  Administered 2017-11-30: 150 mg via INTRAVENOUS
  Filled 2017-11-30: qty 83.34

## 2017-11-30 MED ORDER — AMIODARONE HCL IN DEXTROSE 360-4.14 MG/200ML-% IV SOLN
30.0000 mg/h | INTRAVENOUS | Status: DC
  Filled 2017-11-30: qty 200

## 2017-11-30 MED ORDER — METOPROLOL TARTRATE 5 MG/5ML IV SOLN
5.0000 mg | INTRAVENOUS | Status: AC
  Administered 2017-11-30: 5 mg via INTRAVENOUS
  Filled 2017-11-30: qty 5

## 2017-11-30 MED ORDER — METOPROLOL TARTRATE 25 MG PO TABS: 25.0000 mg | ORAL_TABLET | Freq: Two times a day (BID) | ORAL | Status: DC

## 2017-11-30 MED ORDER — AMIODARONE HCL IN DEXTROSE 360-4.14 MG/200ML-% IV SOLN
60.0000 mg/h | INTRAVENOUS | Status: AC
  Administered 2017-11-30 (×2): 60 mg/h via INTRAVENOUS
  Filled 2017-11-30 (×3): qty 200

## 2017-11-30 MED ORDER — METOPROLOL TARTRATE 50 MG PO TABS
50.0000 mg | ORAL_TABLET | Freq: Two times a day (BID) | ORAL | Status: DC
  Administered 2017-11-30 (×2): 50 mg via ORAL
  Filled 2017-11-30 (×2): qty 1

## 2017-11-30 NOTE — Progress Notes (Signed)
Received call from central tele that pt had converted to A Fib w/ RVR. Pt symptomatic and c/o fluttering in her chest with severe anxiety. PRN xanax given once, pt also medicated for cough. IV Metoprolol given once and VS improved. P.O metoprolol also ordered. EKG completed. Cardiology consult done and has ordered transfer orders to 2A. Will call report when bed available.

## 2017-11-30 NOTE — Progress Notes (Signed)
MEDICATION RELATED CONSULT NOTE - INITIAL   Pharmacy Consult for apixaban dosing  Indication: nonvalvular afib  Allergies  Allergen Reactions  . Codeine Nausea Only    Patient Measurements: Height: 5\' 2"  (157.5 cm) Weight: 73 lb 6.4 oz (33.3 kg) IBW/kg (Calculated) : 50.1  Vital Signs: Temp: 97.7 F (36.5 C) (12/08 0527) Temp Source: Oral (12/08 0527) BP: 134/77 (12/08 1000) Pulse Rate: 129 (12/08 0925) Intake/Output from previous day: 12/07 0701 - 12/08 0700 In: 1246.7 [P.O.:360; I.V.:886.7] Out: 700 [Urine:700] Intake/Output from this shift: Total I/O In: 0  Out: 550 [Urine:550]  Labs: Recent Labs    11/30/17 0407  WBC 7.2  HGB 12.6  HCT 37.1  PLT 171  CREATININE 0.49   Estimated Creatinine Clearance: 37.8 mL/min (by C-G formula based on SCr of 0.49 mg/dL).   Microbiology: No results found for this or any previous visit (from the past 720 hour(s)).  Medical History: Past Medical History:  Diagnosis Date  . Anxiety   . COPD (chronic obstructive pulmonary disease) (HCC)   . Hypertension     Medications:  Medications Prior to Admission  Medication Sig Dispense Refill Last Dose  . albuterol (PROAIR HFA) 108 (90 Base) MCG/ACT inhaler Inhale 2 puffs into the lungs every 6 (six) hours as needed for wheezing or shortness of breath. 1 Inhaler 1 11/26/2017 at Unknown time  . albuterol (PROVENTIL) (5 MG/ML) 0.5% nebulizer solution Take 2.5 mg by nebulization every 4 (four) hours as needed for wheezing or shortness of breath.   11/26/2017 at Unknown time  . lisinopril (PRINIVIL,ZESTRIL) 5 MG tablet Take 5 mg by mouth daily.   11/26/2017 at Unknown time  . tiotropium (SPIRIVA) 18 MCG inhalation capsule Place 18 mcg into inhaler and inhale daily.   11/26/2017 at Unknown time  . feeding supplement, ENSURE ENLIVE, (ENSURE ENLIVE) LIQD Take 237 mLs by mouth 3 (three) times daily between meals. 90 Bottle 1 Past Month at Unknown time  . promethazine (PHENERGAN) 12.5 MG tablet  Take 1 tablet (12.5 mg total) by mouth every 6 (six) hours as needed for nausea or vomiting. (Patient not taking: Reported on 08/08/2017) 30 tablet 0 Not Taking at Unknown time   Scheduled:  . albuterol  2.5 mg Nebulization Q4H  . amiodarone  150 mg Intravenous Once  . apixaban  5 mg Oral BID  . azithromycin  250 mg Oral Daily  . budesonide (PULMICORT) nebulizer solution  0.25 mg Nebulization BID  . docusate sodium  100 mg Oral BID  . feeding supplement (ENSURE ENLIVE)  237 mL Oral BID BM  . guaiFENesin  600 mg Oral BID  . Influenza vac split quadrivalent PF  0.5 mL Intramuscular Tomorrow-1000  . methylPREDNISolone (SOLU-MEDROL) injection  60 mg Intravenous Q8H  . metoprolol tartrate  50 mg Oral BID  . multivitamin with minerals  1 tablet Oral Daily  . nicotine  21 mg Transdermal Daily  . tiotropium  18 mcg Inhalation Daily   Infusions:  . amiodarone     Followed by  . amiodarone     PRN: acetaminophen **OR** acetaminophen, ALPRAZolam, guaiFENesin-dextromethorphan, ondansetron **OR** ondansetron (ZOFRAN) IV Anti-infectives (From admission, onward)   Start     Dose/Rate Route Frequency Ordered Stop   11/28/17 1000  azithromycin (ZITHROMAX) tablet 250 mg     250 mg Oral Daily 11/27/17 0619 12/02/17 0959   11/27/17 1000  azithromycin (ZITHROMAX) tablet 500 mg     500 mg Oral Daily 11/27/17 0619 11/27/17 1100  Assessment: Patient meets criteria for apixaban 5mg  po bid, SCR < 1.5 and age < 7380.   Goal of Therapy:  apixaban 5mg  po bid   Plan:  Monitor cbc and Scr   Gerre PebblesGarrett Kashayla Ungerer 11/30/2017,12:21 PM

## 2017-11-30 NOTE — Progress Notes (Signed)
Dr. Elpidio AnisSudini notified of pt's BP of 100/56.  Paged to ask for BP parameters while pt is on amio gtt. Awaiting return call.

## 2017-11-30 NOTE — Plan of Care (Signed)
Pt transferred this shift from [facility] . A&Ox4. VSS. 2L [place]. Family at bedside. Amiodarone gtt started this shift per order. No complaints thus far. Afib on monitor. Will continue to monitor and report to oncoming RN .  Progressing Education: Knowledge of General Education information will improve 11/30/2017 1356 - Progressing by Jodie EchevariaWhite, Trig Mcbryar L, RN Health Behavior/Discharge Planning: Ability to manage health-related needs will improve 11/30/2017 1356 - Progressing by Jodie EchevariaWhite, Eiman Maret L, RN Clinical Measurements: Ability to maintain clinical measurements within normal limits will improve 11/30/2017 1356 - Progressing by Jodie EchevariaWhite, Kylle Lall L, RN Will remain free from infection 11/30/2017 1356 - Progressing by Jodie EchevariaWhite, Raivyn Kabler L, RN Diagnostic test results will improve 11/30/2017 1356 - Progressing by Jodie EchevariaWhite, Jailynne Opperman L, RN Respiratory complications will improve 11/30/2017 1356 - Progressing by Jodie EchevariaWhite, Deejay Koppelman L, RN Cardiovascular complication will be avoided 11/30/2017 1356 - Progressing by Jodie EchevariaWhite, Nery Kalisz L, RN Activity: Risk for activity intolerance will decrease 11/30/2017 1356 - Progressing by Jodie EchevariaWhite, Kamen Hanken L, RN Nutrition: Adequate nutrition will be maintained 11/30/2017 1356 - Progressing by Jodie EchevariaWhite, Brycelynn Stampley L, RN Coping: Level of anxiety will decrease 11/30/2017 1356 - Progressing by Jodie EchevariaWhite, Mykenzi Vanzile L, RN Elimination: Will not experience complications related to bowel motility 11/30/2017 1356 - Progressing by Jodie EchevariaWhite, Malayjah Otoole L, RN Will not experience complications related to urinary retention 11/30/2017 1356 - Progressing by Jodie EchevariaWhite, Merik Mignano L, RN Pain Managment: General experience of comfort will improve 11/30/2017 1356 - Progressing by Jodie EchevariaWhite, Gionna Polak L, RN Safety: Ability to remain free from injury will improve 11/30/2017 1356 - Progressing by Jodie EchevariaWhite, Lynzie Cliburn L, RN Skin Integrity: Risk for impaired skin integrity will decrease 11/30/2017 1356 - Progressing by Jodie EchevariaWhite, Geneveive Furness L, RN

## 2017-11-30 NOTE — Consult Note (Signed)
Enid BaasSusan E Mills is a 61 y.o. female  [id number]  Primary Cardiologist: Adrian BlackwaterShaukat Khan Reason for Consultation: Chest pain and palpitation/atrial fibrillation  HPI: This is a 61 year old white female who was admitted last Wednesday because of COPD exacerbation presented this morning with palpitation fluttering and chest pain associated with shortness of breath.   Review of Systems: Patient denies any orthopnea PND or leg swelling   Past Medical History:  Diagnosis Date  . Anxiety   . COPD (chronic obstructive pulmonary disease) (HCC)   . Hypertension     Medications Prior to Admission  Medication Sig Dispense Refill  . albuterol (PROAIR HFA) 108 (90 Base) MCG/ACT inhaler Inhale 2 puffs into the lungs every 6 (six) hours as needed for wheezing or shortness of breath. 1 Inhaler 1  . albuterol (PROVENTIL) (5 MG/ML) 0.5% nebulizer solution Take 2.5 mg by nebulization every 4 (four) hours as needed for wheezing or shortness of breath.    . lisinopril (PRINIVIL,ZESTRIL) 5 MG tablet Take 5 mg by mouth daily.    Marland Kitchen. tiotropium (SPIRIVA) 18 MCG inhalation capsule Place 18 mcg into inhaler and inhale daily.    . feeding supplement, ENSURE ENLIVE, (ENSURE ENLIVE) LIQD Take 237 mLs by mouth 3 (three) times daily between meals. 90 Bottle 1  . promethazine (PHENERGAN) 12.5 MG tablet Take 1 tablet (12.5 mg total) by mouth every 6 (six) hours as needed for nausea or vomiting. (Patient not taking: Reported on 08/08/2017) 30 tablet 0     . albuterol  2.5 mg Nebulization Q4H  . amiodarone  150 mg Intravenous Once  . azithromycin  250 mg Oral Daily  . budesonide (PULMICORT) nebulizer solution  0.25 mg Nebulization BID  . docusate sodium  100 mg Oral BID  . feeding supplement (ENSURE ENLIVE)  237 mL Oral BID BM  . guaiFENesin  600 mg Oral BID  . heparin injection (subcutaneous)  5,000 Units Subcutaneous Q8H  . Influenza vac split quadrivalent PF  0.5 mL Intramuscular Tomorrow-1000  . methylPREDNISolone  (SOLU-MEDROL) injection  60 mg Intravenous Q8H  . metoprolol tartrate  50 mg Oral BID  . multivitamin with minerals  1 tablet Oral Daily  . nicotine  21 mg Transdermal Daily  . tiotropium  18 mcg Inhalation Daily    Infusions: . amiodarone     Followed by  . amiodarone      Allergies  Allergen Reactions  . Codeine Nausea Only    Social History   Socioeconomic History  . Marital status: Married    Spouse name: Not on file  . Number of children: Not on file  . Years of education: Not on file  . Highest education level: Not on file  Social Needs  . Financial resource strain: Not on file  . Food insecurity - worry: Not on file  . Food insecurity - inability: Not on file  . Transportation needs - medical: Not on file  . Transportation needs - non-medical: Not on file  Occupational History  . Not on file  Tobacco Use  . Smoking status: Current Every Day Smoker    Packs/day: 0.50    Types: Cigarettes  . Smokeless tobacco: Never Used  Substance and Sexual Activity  . Alcohol use: No  . Drug use: No  . Sexual activity: Yes  Other Topics Concern  . Not on file  Social History Narrative   Lives at home with husband. Steady on ambulation normally at home. Dyspnea with exertion at baseline.  Family History  Problem Relation Age of Onset  . CAD Mother   . CAD Father   . Cancer - Prostate Brother   . Cancer - Lung Brother     PHYSICAL EXAM: Vitals:   11/30/17 0749 11/30/17 0925  BP:  (!) 157/104  Pulse:  (!) 129  Resp:    Temp:    SpO2: 99%      Intake/Output Summary (Last 24 hours) at 11/30/2017 1121 Last data filed at 11/30/2017 1012 Gross per 24 hour  Intake 886.66 ml  Output 500 ml  Net 386.66 ml    General:  Well appearing. No respiratory difficulty HEENT: normal Neck: supple. no JVD. Carotids 2+ bilat; no bruits. No lymphadenopathy or thryomegaly appreciated. Cor: PMI nondisplaced. Regular rate & rhythm. No rubs, gallops or murmurs. Lungs:  clear Abdomen: soft, nontender, nondistended. No hepatosplenomegaly. No bruits or masses. Good bowel sounds. Extremities: no cyanosis, clubbing, rash, edema Neuro: alert & oriented x 3, cranial nerves grossly intact. moves all 4 extremities w/o difficulty. Affect pleasant.  ECG: Atrial fibrillation with rapid ventricular response rate about 1 43 bpm  Results for orders placed or performed during the hospital encounter of 11/27/17 (from the past 24 hour(s))  Cortisol     Status: None   Collection Time: 11/29/17  5:07 PM  Result Value Ref Range   Cortisol, Plasma 3.5 ug/dL  Basic metabolic panel     Status: Abnormal   Collection Time: 11/30/17  4:07 AM  Result Value Ref Range   Sodium 141 135 - 145 mmol/L   Potassium 4.6 3.5 - 5.1 mmol/L   Chloride 105 101 - 111 mmol/L   CO2 31 22 - 32 mmol/L   Glucose, Bld 134 (H) 65 - 99 mg/dL   BUN 20 6 - 20 mg/dL   Creatinine, Ser 1.61 0.44 - 1.00 mg/dL   Calcium 8.8 (L) 8.9 - 10.3 mg/dL   GFR calc non Af Amer >60 >60 mL/min   GFR calc Af Amer >60 >60 mL/min   Anion gap 5 5 - 15  CBC     Status: None   Collection Time: 11/30/17  4:07 AM  Result Value Ref Range   WBC 7.2 3.6 - 11.0 K/uL   RBC 4.06 3.80 - 5.20 MIL/uL   Hemoglobin 12.6 12.0 - 16.0 g/dL   HCT 09.6 04.5 - 40.9 %   MCV 91.3 80.0 - 100.0 fL   MCH 31.0 26.0 - 34.0 pg   MCHC 33.9 32.0 - 36.0 g/dL   RDW 81.1 91.4 - 78.2 %   Platelets 171 150 - 440 K/uL  Urinalysis, Complete w Microscopic     Status: Abnormal   Collection Time: 11/30/17  4:45 AM  Result Value Ref Range   Color, Urine YELLOW (A) YELLOW   APPearance CLEAR (A) CLEAR   Specific Gravity, Urine 1.023 1.005 - 1.030   pH 5.0 5.0 - 8.0   Glucose, UA NEGATIVE NEGATIVE mg/dL   Hgb urine dipstick NEGATIVE NEGATIVE   Bilirubin Urine NEGATIVE NEGATIVE   Ketones, ur NEGATIVE NEGATIVE mg/dL   Protein, ur NEGATIVE NEGATIVE mg/dL   Nitrite NEGATIVE NEGATIVE   Leukocytes, UA TRACE (A) NEGATIVE   RBC / HPF 0-5 0 - 5 RBC/hpf    WBC, UA 0-5 0 - 5 WBC/hpf   Bacteria, UA NONE SEEN NONE SEEN   Squamous Epithelial / LPF 0-5 (A) NONE SEEN   Mucus PRESENT    No results found.   ASSESSMENT AND PLAN: Atrial  fibrillation with rapid ventricular response rate associated with palpitation and fluttering and chest pain and dizziness. Advise placing the patient on IV amiodarone as per protocol and also advise anticoagulants such as  Xarelto. Advise echocardiogram.  KHAN,SHAUKAT A

## 2017-11-30 NOTE — Progress Notes (Signed)
[employer] - [place] at [facility]                                                                                                                                                                                  Patient Demographics   Ashley [name], is a 61 y.o. female, DOB - [birth year], XBJ:[id number]  Admit date - 11/27/2017   Admitting Physician Arnaldo Natal, MD  Outpatient Primary MD for the patient is Toy Cookey, FNP   LOS - 3  Subjective:  Patient in atrial fibrillation with HR 170s. Patient complains of fatigue, not feeling well, palpitations.  Review of Systems:   CONSTITUTIONAL: No documented fever.  Positive fatigue EYES: No blurry or double vision.  ENT: No tinnitus. No postnasal drip. No redness of the oropharynx.  RESPIRATORY: Dry cough, no wheeze, no hemoptysis.  Positive dyspnea.  CARDIOVASCULAR: No chest pain. No orthopnea. No syncope.  GASTROINTESTINAL: No nausea, no vomiting or diarrhea. No abdominal pain. No melena or hematochezia.  GENITOURINARY: No dysuria or hematuria.  ENDOCRINE: No polyuria or nocturia. No heat or cold intolerance.  HEMATOLOGY: No anemia. No bruising. No bleeding.  INTEGUMENTARY: No rashes. No lesions.  MUSCULOSKELETAL: No arthritis. No swelling. No gout.  NEUROLOGIC: No numbness, tingling, or ataxia. No seizure-type activity.  PSYCHIATRIC: Positive anxiety. No insomnia. No ADD.    Vitals:   Vitals:   11/30/17 0527 11/30/17 0749 11/30/17 0925 11/30/17 1000  BP: 122/62  (!) 157/104 134/77  Pulse: 70  (!) 129   Resp: 18     Temp: 97.7 F (36.5 C)     TempSrc: Oral     SpO2: 100% 99%    Weight:      Height:        Wt Readings from Last 3 Encounters:  11/29/17 33.3 kg (73 lb 6.4 oz)  08/08/17 34 kg (75 lb)  07/09/17 33.3 kg (73 lb 6.4 oz)     Intake/Output Summary (Last 24 hours) at 11/30/2017 1155 Last data filed at 11/30/2017 1012 Gross per 24 hour  Intake 886.66 ml  Output 500 ml  Net 386.66 ml     Physical Exam:   GENERAL: Pleasant-appearing in mild respiratory distress.  Anxious HEAD, EYES, EARS, NOSE AND THROAT: Atraumatic, normocephalic. Extraocular muscles are intact. Pupils equal and reactive to light. Sclerae anicteric. No conjunctival injection. No oro-pharyngeal erythema.  NECK: Supple. There is no jugular venous distention. No bruits, no lymphadenopathy, no thyromegaly.  HEART: Irregularly irregular and tachycardic LUNGS: Limited air entry with decreased breath sounds with some assist 3 muscle usage ABDOMEN: Soft, flat, nontender, nondistended. Has good bowel sounds. No hepatosplenomegaly appreciated.  EXTREMITIES: No evidence of any  cyanosis, clubbing, or peripheral edema.  +2 pedal and radial pulses bilaterally.  NEUROLOGIC: The patient is alert, awake, and oriented x3 with no focal motor or sensory deficits appreciated bilaterally.  SKIN: Moist and warm with no rashes appreciated.  Psych: Anxious LN: No inguinal LN enlargement    Antibiotics   Anti-infectives (From admission, onward)   Start     Dose/Rate Route Frequency Ordered Stop   11/28/17 1000  azithromycin (ZITHROMAX) tablet 250 mg     250 mg Oral Daily 11/27/17 0619 12/02/17 0959   11/27/17 1000  azithromycin (ZITHROMAX) tablet 500 mg     500 mg Oral Daily 11/27/17 0619 11/27/17 1100      Medications   Scheduled Meds: . albuterol  2.5 mg Nebulization Q4H  . amiodarone  150 mg Intravenous Once  . azithromycin  250 mg Oral Daily  . budesonide (PULMICORT) nebulizer solution  0.25 mg Nebulization BID  . docusate sodium  100 mg Oral BID  . feeding supplement (ENSURE ENLIVE)  237 mL Oral BID BM  . guaiFENesin  600 mg Oral BID  . heparin injection (subcutaneous)  5,000 Units Subcutaneous Q8H  . Influenza vac split quadrivalent PF  0.5 mL Intramuscular Tomorrow-1000  . methylPREDNISolone (SOLU-MEDROL) injection  60 mg Intravenous Q8H  . metoprolol tartrate  50 mg Oral BID  . multivitamin with minerals   1 tablet Oral Daily  . nicotine  21 mg Transdermal Daily  . tiotropium  18 mcg Inhalation Daily   Continuous Infusions: . amiodarone     Followed by  . amiodarone     PRN Meds:.acetaminophen **OR** acetaminophen, ALPRAZolam, guaiFENesin-dextromethorphan, ondansetron **OR** ondansetron (ZOFRAN) IV   Data Review:   Micro Results No results found for this or any previous visit (from the past 240 hour(s)).  Radiology Reports Dg Chest Portable 1 View  Result Date: 11/27/2017 CLINICAL DATA:  61 y/o  F; shortness of breath.  History of COPD. EXAM: PORTABLE CHEST 1 VIEW COMPARISON:  08/08/2017 chest radiograph FINDINGS: Normal cardiac silhouette. Hyperinflated lungs, emphysema, and biapical pleuroparenchymal scarring are stable. Aortic atherosclerosis with calcification. No focal consolidation. Bones are unremarkable. IMPRESSION: Stable COPD with severe emphysema and biapical pleuroparenchymal scarring. No focal consolidation. Electronically Signed   By: Mitzi HansenLance  Furusawa-Stratton M.D.   On: 11/27/2017 04:49     CBC Recent Labs  Lab 11/27/17 0407 11/30/17 0407  WBC 5.1 7.2  HGB 14.5 12.6  HCT 42.3 37.1  PLT 188 171  MCV 88.9 91.3  MCH 30.5 31.0  MCHC 34.3 33.9  RDW 13.6 14.1  LYMPHSABS 1.4  --   MONOABS 0.4  --   EOSABS 0.1  --   BASOSABS 0.0  --     Chemistries  Recent Labs  Lab 11/27/17 0407 11/30/17 0407  NA 140 141  K 3.7 4.6  CL 105 105  CO2 24 31  GLUCOSE 118* 134*  BUN 13 20  CREATININE 0.54 0.49  CALCIUM 9.1 8.8*  AST 21  --   ALT 12*  --   ALKPHOS 94  --   BILITOT 0.6  --    ------------------------------------------------------------------------------------------------------------------ estimated creatinine clearance is 37.8 mL/min (by C-G formula based on SCr of 0.49 mg/dL). ------------------------------------------------------------------------------------------------------------------ No results for input(s): HGBA1C in the last 72  hours. ------------------------------------------------------------------------------------------------------------------ No results for input(s): CHOL, HDL, LDLCALC, TRIG, CHOLHDL, LDLDIRECT in the last 72 hours. ------------------------------------------------------------------------------------------------------------------ No results for input(s): TSH, T4TOTAL, T3FREE, THYROIDAB in the last 72 hours.  Invalid input(s): FREET3 ------------------------------------------------------------------------------------------------------------------ No results for input(s):  VITAMINB12, FOLATE, FERRITIN, TIBC, IRON, RETICCTPCT in the last 72 hours.  Coagulation profile No results for input(s): INR, PROTIME in the last 168 hours.  No results for input(s): DDIMER in the last 72 hours.  Cardiac Enzymes Recent Labs  Lab 11/27/17 0407  TROPONINI <0.03   ------------------------------------------------------------------------------------------------------------------ Invalid input(s): POCBNP    Assessment & Plan   This is a 61 year old female admitted for respiratory failure.   *Atrial fibrillation with rapid ventricular rate.  New onset.  Due to COPD exacerbation.  Patient was given IV Lopressor 5 mg.  Heart rate continued to be tachycardic.  Oral metoprolol given.  Discussed with Dr. Rikki SpearingKhanof cardiology.  Will start amiodarone drip.  Transferred to telemetry unit.  *Acute on chronic respiratory failure due to COPD exacerbation -IV steroids, Antibiotics - Scheduled Nebulizers - Inhalers -Wean O2 as tolerated - Consult pulmonary if no improvement  *Tobacco abuse.  Counseled to quit on admission.  *Severe malnutrition Nutritional supplements added  *DVT prophylaxis.  Started Eliquis for A. fib.       Code Status Orders  (From admission, onward)        Start     Ordered   11/27/17 0620  Full code  Continuous     11/27/17 0619    Code Status History    Date Active Date  Inactive Code Status Order ID Comments User Context   07/08/2017 18:25 07/10/2017 12:49 Full Code [id number]  Altamese DillingVachhani, Vaibhavkumar, MD Inpatient   02/17/2017 14:25 02/19/2017 16:29 Full Code [id number]  Enid BaasKalisetti, Radhika, MD Inpatient   06/01/2016 02:00 06/02/2016 18:06 Full Code [id number]  Gery Prayrosley, Debby, MD Inpatient   09/30/2015 16:04 10/05/2015 15:50 Full Code [id number]  Altamese DillingVachhani, Vaibhavkumar, MD ED   09/22/2015 17:47 09/24/2015 14:04 Full Code [id number]  Enid BaasKalisetti, Radhika, MD Inpatient      Consults cardiology  DVT Prophylaxis  Lovenox Lab Results  Component Value Date   PLT 171 11/30/2017    Time Spent in minutes   Crtical care time spent 40 minutes  Orie FishermanSrikar R Nely Dedmon M.D on 11/30/2017 at 11:55 AM  Between 7am to 6pm - Pager - [phone]  After 6pm go to [email or web address] - password [email or web address] [email or web address]  [facility]   Office  [phone]

## 2017-11-30 NOTE — Progress Notes (Signed)
Pt complaining of burning sensation while urinating. Primary nurse paged and spoke to Dr. Tobi BastosPyreddy. Orders received for U/A. Primary nurse to continue to monitor.

## 2017-12-01 ENCOUNTER — Inpatient Hospital Stay
Admit: 2017-12-01 | Discharge: 2017-12-01 | Disposition: A | Discharge status: Home / Self Care | Payer: Self-pay | Attending: Cardiovascular Disease | Admitting: Cardiovascular Disease

## 2017-12-01 LAB — ECHOCARDIOGRAM COMPLETE
Height: 62 in
Weight: 1220.8 oz

## 2017-12-01 MED ORDER — METHYLPREDNISOLONE SODIUM SUCC 40 MG IJ SOLR
40.0000 mg | Freq: Two times a day (BID) | INTRAMUSCULAR | Status: DC
  Administered 2017-12-01 – 2017-12-02 (×2): 40 mg via INTRAVENOUS
  Filled 2017-12-01 (×2): qty 1

## 2017-12-01 MED ORDER — ALBUTEROL SULFATE (2.5 MG/3ML) 0.083% IN NEBU
2.5000 mg | INHALATION_SOLUTION | Freq: Four times a day (QID) | RESPIRATORY_TRACT | Status: DC
  Administered 2017-12-01 – 2017-12-03 (×7): 2.5 mg via RESPIRATORY_TRACT
  Filled 2017-12-01 (×7): qty 3

## 2017-12-01 MED ORDER — METOPROLOL TARTRATE 25 MG PO TABS
25.0000 mg | ORAL_TABLET | Freq: Two times a day (BID) | ORAL | Status: DC
  Administered 2017-12-01 – 2017-12-02 (×4): 25 mg via ORAL
  Filled 2017-12-01 (×4): qty 1

## 2017-12-01 MED ORDER — AMIODARONE HCL 200 MG PO TABS
400.0000 mg | ORAL_TABLET | Freq: Two times a day (BID) | ORAL | Status: DC
  Administered 2017-12-01 – 2017-12-02 (×4): 400 mg via ORAL
  Filled 2017-12-01 (×4): qty 2

## 2017-12-01 MED ORDER — SODIUM CHLORIDE 0.9% FLUSH
3.0000 mL | Freq: Two times a day (BID) | INTRAVENOUS | Status: DC
  Administered 2017-12-01 – 2017-12-03 (×6): 3 mL via INTRAVENOUS

## 2017-12-01 MED ORDER — AMIODARONE HCL IN DEXTROSE 360-4.14 MG/200ML-% IV SOLN: 30.0000 mg/h | INTRAVENOUS | Status: AC

## 2017-12-01 NOTE — Progress Notes (Signed)
[employer] - [place] at [facility]                                                                                                                                                                                  Patient Demographics   Ashley RainSusan [name], is a 61 y.o. female, DOB - [birth year], GEX:[id number]  Admit date - 11/27/2017   Admitting Physician Arnaldo NatalMichael S Diamond, MD  Outpatient Primary MD for the patient is [name], [name], FNP   LOS - 4  Subjective:  Patient in atrial fibrillation with HR 60-70s Still feels weak and has SOB  Review of Systems:   CONSTITUTIONAL: No documented fever.  Positive fatigue EYES: No blurry or double vision.  ENT: No tinnitus. No postnasal drip. No redness of the oropharynx.  RESPIRATORY: Dry cough, no wheeze, no hemoptysis.  Positive dyspnea.  CARDIOVASCULAR: No chest pain. No orthopnea. No syncope.  GASTROINTESTINAL: No nausea, no vomiting or diarrhea. No abdominal pain. No melena or hematochezia.  GENITOURINARY: No dysuria or hematuria.  ENDOCRINE: No polyuria or nocturia. No heat or cold intolerance.  HEMATOLOGY: No anemia. No bruising. No bleeding.  INTEGUMENTARY: No rashes. No lesions.  MUSCULOSKELETAL: No arthritis. No swelling. No gout.  NEUROLOGIC: No numbness, tingling, or ataxia. No seizure-type activity.  PSYCHIATRIC: Positive anxiety. No insomnia. No ADD.    Vitals:   Vitals:   12/01/17 0136 12/01/17 0436 12/01/17 0806 12/01/17 0813  BP: 129/77 126/68  (!) 146/89  Pulse: 87 60  75  Resp: 17 16  16   Temp: 97.8 F (36.6 C) 97.9 F (36.6 C)    TempSrc: Oral Oral    SpO2: 96% 97% 95% 100%  Weight:  34.6 kg (76 lb 4.8 oz)    Height:        Wt Readings from Last 3 Encounters:  12/01/17 34.6 kg (76 lb 4.8 oz)  08/08/17 34 kg (75 lb)  07/09/17 33.3 kg (73 lb 6.4 oz)     Intake/Output Summary (Last 24 hours) at 12/01/2017 0927 Last data filed at 12/01/2017 0439 Gross per 24 hour  Intake 433.87 ml  Output 550 ml   Net -116.13 ml    Physical Exam:   GENERAL: Pleasant-appearing in mild respiratory distress.  Anxious HEAD, EYES, EARS, NOSE AND THROAT: Atraumatic, normocephalic. Extraocular muscles are intact. Pupils equal and reactive to light. Sclerae anicteric. No conjunctival injection. No oro-pharyngeal erythema.  NECK: Supple. There is no jugular venous distention. No bruits, no lymphadenopathy, no thyromegaly.  HEART: Irregularly irregular  LUNGS: Limited air entry with decreased breath sounds with some assist 3 muscle usage ABDOMEN: Soft, flat, nontender, nondistended. Has good bowel sounds. No hepatosplenomegaly appreciated.  EXTREMITIES:  No evidence of any cyanosis, clubbing, or peripheral edema.  +2 pedal and radial pulses bilaterally.  NEUROLOGIC: The patient is alert, awake, and oriented x3 with no focal motor or sensory deficits appreciated bilaterally.  SKIN: Moist and warm with no rashes appreciated.  Psych: Anxious LN: No inguinal LN enlargement    Antibiotics   Anti-infectives (From admission, onward)   Start     Dose/Rate Route Frequency Ordered Stop   11/28/17 1000  azithromycin (ZITHROMAX) tablet 250 mg     250 mg Oral Daily 11/27/17 0619 12/02/17 0959   11/27/17 1000  azithromycin (ZITHROMAX) tablet 500 mg     500 mg Oral Daily 11/27/17 0619 11/27/17 1100      Medications   Scheduled Meds: . albuterol  2.5 mg Nebulization Q4H  . amiodarone  400 mg Oral BID  . apixaban  5 mg Oral BID  . azithromycin  250 mg Oral Daily  . budesonide (PULMICORT) nebulizer solution  0.25 mg Nebulization BID  . docusate sodium  100 mg Oral BID  . feeding supplement (ENSURE ENLIVE)  237 mL Oral BID BM  . guaiFENesin  600 mg Oral BID  . Influenza vac split quadrivalent PF  0.5 mL Intramuscular Tomorrow-1000  . methylPREDNISolone (SOLU-MEDROL) injection  40 mg Intravenous Q12H  . metoprolol tartrate  50 mg Oral BID  . multivitamin with minerals  1 tablet Oral Daily  . nicotine  21 mg  Transdermal Daily  . sodium chloride flush  3 mL Intravenous Q12H  . tiotropium  18 mcg Inhalation Daily   Continuous Infusions: . amiodarone     PRN Meds:.acetaminophen **OR** acetaminophen, ALPRAZolam, guaiFENesin-dextromethorphan, ondansetron **OR** ondansetron (ZOFRAN) IV   Data Review:   Micro Results No results found for this or any previous visit (from the past 240 hour(s)).  Radiology Reports Dg Chest Portable 1 View  Result Date: 11/27/2017 CLINICAL DATA:  61 y/o  F; shortness of breath.  History of COPD. EXAM: PORTABLE CHEST 1 VIEW COMPARISON:  08/08/2017 chest radiograph FINDINGS: Normal cardiac silhouette. Hyperinflated lungs, emphysema, and biapical pleuroparenchymal scarring are stable. Aortic atherosclerosis with calcification. No focal consolidation. Bones are unremarkable. IMPRESSION: Stable COPD with severe emphysema and biapical pleuroparenchymal scarring. No focal consolidation. Electronically Signed   By: Mitzi HansenLance  Furusawa-Stratton M.D.   On: 11/27/2017 04:49     CBC Recent Labs  Lab 11/27/17 0407 11/30/17 0407  WBC 5.1 7.2  HGB 14.5 12.6  HCT 42.3 37.1  PLT 188 171  MCV 88.9 91.3  MCH 30.5 31.0  MCHC 34.3 33.9  RDW 13.6 14.1  LYMPHSABS 1.4  --   MONOABS 0.4  --   EOSABS 0.1  --   BASOSABS 0.0  --     Chemistries  Recent Labs  Lab 11/27/17 0407 11/30/17 0407  NA 140 141  K 3.7 4.6  CL 105 105  CO2 24 31  GLUCOSE 118* 134*  BUN 13 20  CREATININE 0.54 0.49  CALCIUM 9.1 8.8*  AST 21  --   ALT 12*  --   ALKPHOS 94  --   BILITOT 0.6  --    ------------------------------------------------------------------------------------------------------------------ estimated creatinine clearance is 39.3 mL/min (by C-G formula based on SCr of 0.49 mg/dL). ------------------------------------------------------------------------------------------------------------------ No results for input(s): HGBA1C in the last 72  hours. ------------------------------------------------------------------------------------------------------------------ No results for input(s): CHOL, HDL, LDLCALC, TRIG, CHOLHDL, LDLDIRECT in the last 72 hours. ------------------------------------------------------------------------------------------------------------------ No results for input(s): TSH, T4TOTAL, T3FREE, THYROIDAB in the last 72 hours.  Invalid input(s): FREET3 ------------------------------------------------------------------------------------------------------------------ No  results for input(s): VITAMINB12, FOLATE, FERRITIN, TIBC, IRON, RETICCTPCT in the last 72 hours.  Coagulation profile No results for input(s): INR, PROTIME in the last 168 hours.  No results for input(s): DDIMER in the last 72 hours.  Cardiac Enzymes Recent Labs  Lab 11/27/17 0407  TROPONINI <0.03   ------------------------------------------------------------------------------------------------------------------ Invalid input(s): POCBNP    Assessment & Plan   This is a 61 year old female admitted for respiratory failure.   *Atrial fibrillation with rapid ventricular rate On amiodarone drip and rate controlled in 60-70s. Stop drip and change to PO 400 BID Decrease dose of metoprolol Eliquis Dr. Welton Flakes following patient Echo pending  *Acute on chronic respiratory failure due to COPD exacerbation -IV steroids, Antibiotics - Scheduled Nebulizers - Inhalers -Wean O2 as tolerated  Uses home O2 PRN  *Tobacco abuse.  Counseled to quit on admission.  *Severe malnutrition Nutritional supplements added  *DVT prophylaxis.  Started Eliquis for A. fib.       Code Status Orders  (From admission, onward)        Start     Ordered   11/27/17 0620  Full code  Continuous     11/27/17 0619    Code Status History    Date Active Date Inactive Code Status Order ID Comments User Context   07/08/2017 18:25 07/10/2017 12:49 Full Code  [id number]  Altamese Dilling, MD Inpatient   02/17/2017 14:25 02/19/2017 16:29 Full Code [id number]  Enid Baas, MD Inpatient   06/01/2016 02:00 06/02/2016 18:06 Full Code [id number]  Gery Pray, MD Inpatient   09/30/2015 16:04 10/05/2015 15:50 Full Code [id number]  Altamese Dilling, MD ED   09/22/2015 17:47 09/24/2015 14:04 Full Code [id number]  Enid Baas, MD Inpatient      Consults cardiology  DVT Prophylaxis  Eliquis Lab Results  Component Value Date   PLT 171 11/30/2017    Time Spent in minutes  35 minutes  Likely d/c in AM  Molinda Bailiff Canden Cieslinski M.D on 12/01/2017 at 9:27 AM  Between 7am to 6pm - Pager - [phone]  After 6pm go to [email or web address] - password [email or web address] [facility]  [facility] [place] [facility]   Office  [phone]

## 2017-12-01 NOTE — Plan of Care (Signed)
Patient Amiodorone drip. Still in Afib but heart rate controlled.

## 2017-12-02 MED ORDER — PREDNISONE 50 MG PO TABS
50.0000 mg | ORAL_TABLET | Freq: Every day | ORAL | Status: DC
  Administered 2017-12-02 – 2017-12-03 (×2): 50 mg via ORAL
  Filled 2017-12-02 (×2): qty 1

## 2017-12-02 NOTE — Progress Notes (Signed)
SATURATION QUALIFICATIONS: (This note is used to comply with regulatory documentation for home oxygen)  Patient Saturations on Room Air at Rest = 99%  Patient Saturations on Room Air while Ambulating = 84%  Patient Saturations on 2 Liters of oxygen while Ambulating = 95%

## 2017-12-02 NOTE — Progress Notes (Signed)
[facility] - [place] [facility]   PATIENT NAME: Ashley [name]    MR#:  [id number]  DATE OF BIRTH:  1956/12/17  SUBJECTIVE:  CHIEF COMPLAINT:   Chief Complaint  Patient presents with  . Shortness of Breath   - tearful as COPD getting worse in the several years. Qualified for o2 on exertion - overall improving now  REVIEW OF SYSTEMS:  Review of Systems  Constitutional: Negative for chills and fever.  HENT: Negative for congestion, ear discharge, hearing loss and nosebleeds.   Eyes: Negative for blurred vision and double vision.  Respiratory: Positive for shortness of breath. Negative for cough and wheezing.   Cardiovascular: Negative for chest pain and palpitations.  Gastrointestinal: Negative for abdominal pain, constipation, diarrhea, nausea and vomiting.  Genitourinary: Negative for dysuria.  Musculoskeletal: Negative for myalgias.  Neurological: Positive for weakness. Negative for dizziness, speech change, focal weakness, seizures and headaches.  Psychiatric/Behavioral: Negative for depression.    DRUG ALLERGIES:   Allergies  Allergen Reactions  . Codeine Nausea Only    VITALS:  Blood pressure (!) 158/76, pulse 63, temperature 98.4 F (36.9 C), temperature source Oral, resp. rate 16, height 5\' 2"  (1.575 m), weight 34.8 kg (76 lb 11.2 oz), SpO2 99 %.  PHYSICAL EXAMINATION:  Physical Exam  GENERAL:  61 y.o.-year-old thin built patient lying in the bed with no acute distress.  EYES: Pupils equal, round, reactive to light and accommodation. No scleral icterus. Extraocular muscles intact.  HEENT: Head atraumatic, normocephalic. Oropharynx and nasopharynx clear.  NECK:  Supple, no jugular venous distention. No thyroid enlargement, no tenderness.  LUNGS: improved breath sounds bilaterally, diminished at the bases, minimal scattered wheezing, no rales,rhonchi or crepitation. No use of accessory muscles of respiration.  CARDIOVASCULAR: S1, S2 normal. No  murmurs, rubs, or gallops.  ABDOMEN: Soft, nontender, nondistended. Bowel sounds present. No organomegaly or mass.  EXTREMITIES: No pedal edema, cyanosis, or clubbing.  NEUROLOGIC: Cranial nerves II through XII are intact. Muscle strength 5/5 in all extremities. Sensation intact. Gait not checked.  PSYCHIATRIC: The patient is alert and oriented x 3.  SKIN: No obvious rash, lesion, or ulcer.    LABORATORY PANEL:   CBC Recent Labs  Lab 11/30/17 0407  WBC 7.2  HGB 12.6  HCT 37.1  PLT 171   ------------------------------------------------------------------------------------------------------------------  Chemistries  Recent Labs  Lab 11/27/17 0407 11/30/17 0407  NA 140 141  K 3.7 4.6  CL 105 105  CO2 24 31  GLUCOSE 118* 134*  BUN 13 20  CREATININE 0.54 0.49  CALCIUM 9.1 8.8*  AST 21  --   ALT 12*  --   ALKPHOS 94  --   BILITOT 0.6  --    ------------------------------------------------------------------------------------------------------------------  Cardiac Enzymes Recent Labs  Lab 11/27/17 0407  TROPONINI <0.03   ------------------------------------------------------------------------------------------------------------------  RADIOLOGY:  No results found.  EKG:   Orders placed or performed during the hospital encounter of 11/27/17  . ED EKG  . ED EKG  . EKG 12-Lead  . EKG 12-Lead  . EKG 12-Lead  . EKG 12-Lead  . EKG 12-Lead  . EKG 12-Lead    ASSESSMENT AND PLAN:   61 year old female with past medical history of chronic respiratory failure secondary to COPD, hypertension and ongoing smoking presents to hospital secondary to worsening dyspnea and cough.  #1 Acute on chronic respiratory failure- secondary to COPD-  - change IV steroids to oral prednisone today - improving, qualified for home o2- 2l -continue inhalers and nebs  2.  A. fib with rapid ventricular response-likely triggered by underlying COPD. -Now converted to normal sinus  rhythm. -Was started on oral amiodarone yesterday. -Continue metoprolol.  Also on Eliquis for anticoagulation. Follow-up with pulmonary as outpatient  3.  Tobacco use disorder-strongly counseled against smoking  4.  Malnutrition-secondary to COPD.,  Chronic illness. -Continue dietary supplements  5.  DVT prophylaxis-on Eliquis  Encourage ambulation   All the records are reviewed and case discussed with Care Management/Social Workerr. Management plans discussed with the patient, family and they are in agreement.  CODE STATUS: Full Code  TOTAL TIME TAKING CARE OF THIS PATIENT: 37 minutes.   POSSIBLE D/C TOMORROW, DEPENDING ON CLINICAL CONDITION.   [name] M.D on 12/02/2017 at 9:31 AM  Between 7am to 6pm - Pager - [phone]  After 6pm go to [email or web address] - Social research officer, governmentpassword EPAS [facility]  [facility] [place] [facility]  Office  [phone]  CC: Primary care physician; [name], [name], FNP

## 2017-12-03 MED ORDER — ALPRAZOLAM 0.25 MG PO TABS: 0.2500 mg | ORAL_TABLET | Freq: Every evening | ORAL | 0 refills | Status: DC | PRN

## 2017-12-03 MED ORDER — ALBUTEROL SULFATE (5 MG/ML) 0.5% IN NEBU: 2.5000 mg | INHALATION_SOLUTION | Freq: Four times a day (QID) | RESPIRATORY_TRACT | 1 refills | Status: DC | PRN

## 2017-12-03 MED ORDER — AMIODARONE HCL 200 MG PO TABS: 200.0000 mg | ORAL_TABLET | Freq: Two times a day (BID) | ORAL | 0 refills | Status: DC

## 2017-12-03 MED ORDER — AMIODARONE HCL 200 MG PO TABS
200.0000 mg | ORAL_TABLET | Freq: Two times a day (BID) | ORAL | Status: DC
  Administered 2017-12-03: 200 mg via ORAL
  Filled 2017-12-03: qty 1

## 2017-12-03 MED ORDER — DOCUSATE SODIUM 100 MG PO CAPS: 100.0000 mg | ORAL_CAPSULE | Freq: Two times a day (BID) | ORAL | 0 refills | Status: DC

## 2017-12-03 MED ORDER — BUDESONIDE-FORMOTEROL FUMARATE 160-4.5 MCG/ACT IN AERO: 2.0000 | INHALATION_SPRAY | Freq: Two times a day (BID) | RESPIRATORY_TRACT | 12 refills | Status: DC

## 2017-12-03 MED ORDER — PREDNISONE 10 MG PO TABS: ORAL_TABLET | ORAL | 0 refills | Status: DC

## 2017-12-03 MED ORDER — APIXABAN 5 MG PO TABS: 5.0000 mg | ORAL_TABLET | Freq: Two times a day (BID) | ORAL | 2 refills | Status: AC

## 2017-12-03 NOTE — Care Management (Signed)
Informed that patient did not qualify for charity care. Patient receives [employer].  Discussed checking with DSS to discuss medicaid.  She is upset that she does not qualify.  Will have to pay for her oxygen concentrator

## 2017-12-03 NOTE — Progress Notes (Signed)
SUBJECTIVE: Patient is feeling much better denies any chest pain or palpitation   Vitals:   12/03/17 0245 12/03/17 0439 12/03/17 0500 12/03/17 0735  BP:  131/72    Pulse:  (!) 57    Resp:  18    Temp:  97.7 F (36.5 C)    TempSrc:  Oral    SpO2: 98% 100%  100%  Weight:   76 lb (34.5 kg)   Height:        Intake/Output Summary (Last 24 hours) at 12/03/2017 0848 Last data filed at 12/02/2017 2307 Gross per 24 hour  Intake 240 ml  Output 650 ml  Net -410 ml    LABS: Basic Metabolic Panel: No results for input(s): NA, K, CL, CO2, GLUCOSE, BUN, CREATININE, CALCIUM, MG, PHOS in the last 72 hours. Liver Function Tests: No results for input(s): AST, ALT, ALKPHOS, BILITOT, PROT, ALBUMIN in the last 72 hours. No results for input(s): LIPASE, AMYLASE in the last 72 hours. CBC: No results for input(s): WBC, NEUTROABS, HGB, HCT, MCV, PLT in the last 72 hours. Cardiac Enzymes: No results for input(s): CKTOTAL, CKMB, CKMBINDEX, TROPONINI in the last 72 hours. BNP: Invalid input(s): POCBNP D-Dimer: No results for input(s): DDIMER in the last 72 hours. Hemoglobin A1C: No results for input(s): HGBA1C in the last 72 hours. Fasting Lipid Panel: No results for input(s): CHOL, HDL, LDLCALC, TRIG, CHOLHDL, LDLDIRECT in the last 72 hours. Thyroid Function Tests: No results for input(s): TSH, T4TOTAL, T3FREE, THYROIDAB in the last 72 hours.  Invalid input(s): FREET3 Anemia Panel: No results for input(s): VITAMINB12, FOLATE, FERRITIN, TIBC, IRON, RETICCTPCT in the last 72 hours.   PHYSICAL EXAM General: Well developed, well nourished, in no acute distress HEENT:  Normocephalic and atramatic Neck:  No JVD.  Lungs: Clear bilaterally to auscultation and percussion. Heart: HRRR . Normal S1 and S2 without gallops or murmurs.  Abdomen: Bowel sounds are positive, abdomen soft and non-tender  Msk:  Back normal, normal gait. Normal strength and tone for age. Extremities: No clubbing, cyanosis  or edema.   Neuro: Alert and oriented X 3. Psych:  Good affect, responds appropriately  TELEMETRY: Sinus rhythm  ASSESSMENT AND PLAN: Status post atrial fibrillation with rapid ventricular response rate right now in sinus rhythm with heart rate running between 60 and 70. We will discontinue metoprolol and change amiodarone to 200 by mouth twice a day and eventually 200 once a day.  Active Problems:   Acute on chronic respiratory failure with hypoxia (HCC)    Adrian BlackwaterKHAN,Ciarrah Rae A, MD, [facility] 12/03/2017 8:48 AM

## 2017-12-03 NOTE — Plan of Care (Signed)
  Adequate for Discharge Education: Knowledge of General Education information will improve 12/03/2017 1224 - Adequate for Discharge by Erma HeritageAlejo Calderon, Stephaun Million, RN Health Behavior/Discharge Planning: Ability to manage health-related needs will improve 12/03/2017 1224 - Adequate for Discharge by Erma HeritageAlejo Calderon, Sanjuanita Condrey, RN Clinical Measurements: Ability to maintain clinical measurements within normal limits will improve 12/03/2017 1224 - Adequate for Discharge by Weldon PickingAlejo Calderon, Manuella GhaziBerenice, RN Will remain free from infection 12/03/2017 1224 - Adequate for Discharge by Erma HeritageAlejo Calderon, Daveah Varone, RN Diagnostic test results will improve 12/03/2017 1224 - Adequate for Discharge by Erma HeritageAlejo Calderon, Adeleine Pask, RN Respiratory complications will improve 12/03/2017 1224 - Adequate for Discharge by Erma HeritageAlejo Calderon, Lindee Leason, RN Cardiovascular complication will be avoided 12/03/2017 1224 - Adequate for Discharge by Weldon PickingAlejo Calderon, Manuella GhaziBerenice, RN Activity: Risk for activity intolerance will decrease 12/03/2017 1224 - Adequate for Discharge by Erma HeritageAlejo Calderon, Tenae Graziosi, RN Nutrition: Adequate nutrition will be maintained 12/03/2017 1224 - Adequate for Discharge by Erma HeritageAlejo Calderon, Linden Tagliaferro, RN Coping: Level of anxiety will decrease 12/03/2017 1224 - Adequate for Discharge by Weldon PickingAlejo Calderon, Manuella GhaziBerenice, RN Elimination: Will not experience complications related to bowel motility 12/03/2017 1224 - Adequate for Discharge by Weldon PickingAlejo Calderon, Manuella GhaziBerenice, RN Will not experience complications related to urinary retention 12/03/2017 1224 - Adequate for Discharge by Erma HeritageAlejo Calderon, Karo Rog, RN Pain Managment: General experience of comfort will improve 12/03/2017 1224 - Adequate for Discharge by Erma HeritageAlejo Calderon, Azarius Lambson, RN Safety: Ability to remain free from injury will improve 12/03/2017 1224 - Adequate for Discharge by Erma HeritageAlejo Calderon, Codie Hainer, RN Skin Integrity: Risk for impaired skin integrity will  decrease 12/03/2017 1224 - Adequate for Discharge by Erma HeritageAlejo Calderon, Iylah Dworkin, RN

## 2017-12-03 NOTE — Progress Notes (Signed)
Discharge instructions given with prescriptions. Pt and family at bedside verbalized understanding. IV out and tele off. Pt will be transported via wheelchair.

## 2017-12-03 NOTE — Discharge Summary (Signed)
[facility] - [place] [facility]   PATIENT NAME: Ashley [name]    MR#:  [id number]  DATE OF BIRTH:  06-Oct-1956  DATE OF ADMISSION:  11/27/2017   ADMITTING PHYSICIAN: Ashley NatalMichael S Diamond, MD  DATE OF DISCHARGE: 12/03/2017  PRIMARY CARE PHYSICIAN: Ashley CookeyHeadrick, [name], FNP   ADMISSION DIAGNOSIS:   SOB  DISCHARGE DIAGNOSIS:   Active Problems:   Acute on chronic respiratory failure with hypoxia (HCC)   SECONDARY DIAGNOSIS:   Past Medical History:  Diagnosis Date  . Anxiety   . COPD (chronic obstructive pulmonary disease) (HCC)   . Hypertension     HOSPITAL COURSE:   61 year old female with past medical history of chronic respiratory failure secondary to COPD, hypertension and ongoing smoking presents to hospital secondary to worsening dyspnea and cough.  #1 Acute on chronic respiratory failure- secondary to COPD-  - on IV steroids - to oral prednisone taper at discharge - improved, qualified for home o2- 2l -continue inhalers and nebs -Symbicort started at discharge  2.  [name]. fib with rapid ventricular response-likely triggered by underlying COPD. -Now converted to normal sinus rhythm. -started on oral amiodarone - being discharged on 200 twice [name] day of amiodarone, after 1 week decrease to 200 daily. Cardiology follow-up given.. - Also on Eliquis for anticoagulation. Follow-up with pulmonary as outpatient  3.  Tobacco use disorder-strongly counseled against smoking  4.  Malnutrition-secondary to COPD.,  Chronic illness. -Continue dietary supplements  Stable for discharge today    DISCHARGE CONDITIONS:   Guarded  CONSULTS OBTAINED:   Treatment Team:  Ashley NancyKhan, Shaukat A, MD  DRUG ALLERGIES:   Allergies  Allergen Reactions  . Codeine Nausea Only   DISCHARGE MEDICATIONS:   Allergies as of 12/03/2017      Reactions   Codeine Nausea Only      Medication List    TAKE these medications   albuterol 108 (90 Base) MCG/ACT  inhaler Commonly known as:  PROAIR HFA Inhale 2 puffs into the lungs every 6 (six) hours as needed for wheezing or shortness of breath. What changed:  Another medication with the same name was changed. Make sure you understand how and when to take each.   albuterol (5 MG/ML) 0.5% nebulizer solution Commonly known as:  PROVENTIL Take 0.5 mLs (2.5 mg total) by nebulization every 6 (six) hours as needed for wheezing or shortness of breath. What changed:  when to take this   ALPRAZolam 0.25 MG tablet Commonly known as:  XANAX Take 1 tablet (0.25 mg total) by mouth at bedtime as needed for anxiety.   amiodarone 200 MG tablet Commonly known as:  PACERONE Take 1 tablet (200 mg total) by mouth 2 (two) times daily. X 1 week and then change to 200mg  po qdaily   apixaban 5 MG Tabs tablet Commonly known as:  ELIQUIS Take 1 tablet (5 mg total) by mouth 2 (two) times daily.   budesonide-formoterol 160-4.5 MCG/ACT inhaler Commonly known as:  SYMBICORT Inhale 2 puffs into the lungs 2 (two) times daily.   docusate sodium 100 MG capsule Commonly known as:  COLACE Take 1 capsule (100 mg total) by mouth 2 (two) times daily.   feeding supplement (ENSURE ENLIVE) Liqd Take 237 mLs by mouth 3 (three) times daily between meals.   lisinopril 5 MG tablet Commonly known as:  PRINIVIL,ZESTRIL Take 5 mg by mouth daily.   predniSONE 10 MG tablet Commonly known as:  DELTASONE 6 tabs PO x 2 days 5 tabs PO  x 2 days 4 tabs PO x 2 days 3 tabs PO x 2 days 2 tabs PO x 2 days 1 tab PO x 2 days and stop Start taking on:  12/04/2017   promethazine 12.5 MG tablet Commonly known as:  PHENERGAN Take 1 tablet (12.5 mg total) by mouth every 6 (six) hours as needed for nausea or vomiting.   tiotropium 18 MCG inhalation capsule Commonly known as:  SPIRIVA Place 18 mcg into inhaler and inhale daily.            Durable Medical Equipment  (From admission, onward)        Start     Ordered   12/03/17  0855  For home use only DME oxygen  Once    Question Answer Comment  Mode or (Route) Nasal cannula   Liters per Minute 2   Frequency Continuous (stationary and portable oxygen unit needed)   Oxygen conserving device Yes   Oxygen delivery system Gas      12/03/17 0854       DISCHARGE INSTRUCTIONS:   1. PCP f/u in 1-2 weeks 2. Cardiology f/u in 1 week  DIET:   Cardiac diet  ACTIVITY:   Activity as tolerated  OXYGEN:   Home Oxygen: Yes.    Oxygen Delivery: 2 liters/min via Patient connected to nasal cannula oxygen  DISCHARGE LOCATION:   home   If you experience worsening of your admission symptoms, develop shortness of breath, life threatening emergency, suicidal or homicidal thoughts you must seek medical attention immediately by calling 911 or calling your MD immediately  if symptoms less severe.  You Must read complete instructions/literature along with all the possible adverse reactions/side effects for all the Medicines you take and that have been prescribed to you. Take any new Medicines after you have completely understood and accpet all the possible adverse reactions/side effects.   Please note  You were cared for by [name] hospitalist during your hospital stay. If you have any questions about your discharge medications or the care you received while you were in the hospital after you are discharged, you can call the unit and asked to speak with the hospitalist on call if the hospitalist that took care of you is not available. Once you are discharged, your primary care physician will handle any further medical issues. Please note that NO REFILLS for any discharge medications will be authorized once you are discharged, as it is imperative that you return to your primary care physician (or establish [name] relationship with [name] primary care physician if you do not have one) for your aftercare needs so that they can reassess your need for medications and monitor your lab  values.    On the day of Discharge:  VITAL SIGNS:   Blood pressure 131/72, pulse (!) 57, temperature 97.7 F (36.5 C), temperature source Oral, resp. rate 18, height 5\' 2"  (1.575 m), weight 34.5 kg (76 lb), SpO2 100 %.  PHYSICAL EXAMINATION:    GENERAL:  61 y.o.-year-old thin built patient lying in the bed with no acute distress.  EYES: Pupils equal, round, reactive to light and accommodation. No scleral icterus. Extraocular muscles intact.  HEENT: Head atraumatic, normocephalic. Oropharynx and nasopharynx clear.  NECK:  Supple, no jugular venous distention. No thyroid enlargement, no tenderness.  LUNGS: improved breath sounds bilaterally, diminished at the bases, minimal scattered wheezing, no rales,rhonchi or crepitation. No use of accessory muscles of respiration.  CARDIOVASCULAR: S1, S2 normal. No murmurs, rubs, or gallops.  ABDOMEN:  Soft, nontender, nondistended. Bowel sounds present. No organomegaly or mass.  EXTREMITIES: No pedal edema, cyanosis, or clubbing.  NEUROLOGIC: Cranial nerves II through XII are intact. Muscle strength 5/5 in all extremities. Sensation intact. Gait not checked.  PSYCHIATRIC: The patient is alert and oriented x 3.  SKIN: No obvious rash, lesion, or ulcer.    DATA REVIEW:   CBC Recent Labs  Lab 11/30/17 0407  WBC 7.2  HGB 12.6  HCT 37.1  PLT 171    Chemistries  Recent Labs  Lab 11/27/17 0407 11/30/17 0407  NA 140 141  K 3.7 4.6  CL 105 105  CO2 24 31  GLUCOSE 118* 134*  BUN 13 20  CREATININE 0.54 0.49  CALCIUM 9.1 8.8*  AST 21  --   ALT 12*  --   ALKPHOS 94  --   BILITOT 0.6  --      Microbiology Results  Results for orders placed or performed during the hospital encounter of 02/17/17  Urine culture     Status: Abnormal   Collection Time: 02/17/17  9:13 AM  Result Value Ref Range Status   Specimen Description URINE, RANDOM  Final   Special Requests NONE  Final   Culture 20,000 COLONIES/mL ESCHERICHIA COLI ([name])  Final    Report Status 02/20/2017 FINAL  Final   Organism ID, Bacteria ESCHERICHIA COLI ([name])  Final      Susceptibility   Escherichia coli - MIC*    AMPICILLIN 4 SENSITIVE Sensitive     CEFAZOLIN <=4 SENSITIVE Sensitive     CEFTRIAXONE <=1 SENSITIVE Sensitive     CIPROFLOXACIN >=4 RESISTANT Resistant     GENTAMICIN <=1 SENSITIVE Sensitive     IMIPENEM <=0.25 SENSITIVE Sensitive     NITROFURANTOIN <=16 SENSITIVE Sensitive     TRIMETH/SULFA <=20 SENSITIVE Sensitive     AMPICILLIN/SULBACTAM 4 SENSITIVE Sensitive     PIP/TAZO <=4 SENSITIVE Sensitive     Extended ESBL NEGATIVE Sensitive     * 20,000 COLONIES/mL ESCHERICHIA COLI  Blood culture (routine x 2)     Status: None   Collection Time: 02/17/17 12:00 PM  Result Value Ref Range Status   Specimen Description BLOOD RIGHT ARM  Final   Special Requests   Final    BOTTLES DRAWN AEROBIC AND ANAEROBIC ANA12ML AER12ML   Culture NO GROWTH 5 DAYS  Final   Report Status 02/22/2017 FINAL  Final  Blood culture (routine x 2)     Status: None   Collection Time: 02/17/17 12:00 PM  Result Value Ref Range Status   Specimen Description BLOOD LEFT ARM  Final   Special Requests BOTTLES DRAWN AEROBIC AND ANAEROBIC AER14ML ANA9ML  Final   Culture NO GROWTH 5 DAYS  Final   Report Status 02/22/2017 FINAL  Final  Gastrointestinal Panel by PCR , Stool     Status: None   Collection Time: 02/18/17 10:36 AM  Result Value Ref Range Status   Campylobacter species NOT DETECTED NOT DETECTED Final   Plesimonas shigelloides NOT DETECTED NOT DETECTED Final   Salmonella species NOT DETECTED NOT DETECTED Final   Yersinia enterocolitica NOT DETECTED NOT DETECTED Final   Vibrio species NOT DETECTED NOT DETECTED Final   Vibrio cholerae NOT DETECTED NOT DETECTED Final   Enteroaggregative E coli (EAEC) NOT DETECTED NOT DETECTED Final   Enteropathogenic E coli (EPEC) NOT DETECTED NOT DETECTED Final   Enterotoxigenic E coli (ETEC) NOT DETECTED NOT DETECTED Final   Shiga like  toxin producing E coli (STEC)  NOT DETECTED NOT DETECTED Final   Shigella/Enteroinvasive E coli (EIEC) NOT DETECTED NOT DETECTED Final   Cryptosporidium NOT DETECTED NOT DETECTED Final   Cyclospora cayetanensis NOT DETECTED NOT DETECTED Final   Entamoeba histolytica NOT DETECTED NOT DETECTED Final   Giardia lamblia NOT DETECTED NOT DETECTED Final   Adenovirus F40/41 NOT DETECTED NOT DETECTED Final   Astrovirus NOT DETECTED NOT DETECTED Final   Norovirus GI/GII NOT DETECTED NOT DETECTED Final   Rotavirus [name] NOT DETECTED NOT DETECTED Final   Sapovirus (I, II, IV, and V) NOT DETECTED NOT DETECTED Final    RADIOLOGY:  No results found.   Management plans discussed with the patient, family and they are in agreement.  CODE STATUS:     Code Status Orders  (From admission, onward)        Start     Ordered   11/27/17 0620  Full code  Continuous     11/27/17 0619    Code Status History    Date Active Date Inactive Code Status Order ID Comments User Context   07/08/2017 18:25 07/10/2017 12:49 Full Code [id number]  Altamese Dilling, MD Inpatient   02/17/2017 14:25 02/19/2017 16:29 Full Code [id number]  Enid Baas, MD Inpatient   06/01/2016 02:00 06/02/2016 18:06 Full Code [id number]  Gery Pray, MD Inpatient   09/30/2015 16:04 10/05/2015 15:50 Full Code [id number]  Altamese Dilling, MD ED   09/22/2015 17:47 09/24/2015 14:04 Full Code [id number]  Enid Baas, MD Inpatient      TOTAL TIME TAKING CARE OF THIS PATIENT: 38 minutes.    Enid Baas M.D on 12/03/2017 at 8:59 AM  Between 7am to 6pm - Pager - [phone]  After 6pm go to [email or web address] - Social research officer, government  [facility] [place] [facility]  Office  [phone]  CC: Primary care physician; Ashley Cookey, FNP   Note: This dictation was prepared with Dragon dictation along with smaller phrase technology. Any transcriptional errors that result from this process are unintentional.

## 2017-12-03 NOTE — Progress Notes (Signed)
Pt being taken down via wheelchair.

## 2017-12-03 NOTE — Care Management (Signed)
Patient for discharge home today.  She has qualified for continuous oxygen.  She has portable tanks at home provided by [facility] but does not have a concentrator.  Referral made to [facility] for SN SW and COPD protocol under charity care.  She is also to discharge home on Eliquis which is new.  [facility]  Provided her with 30 day Eliquis coupon and patient assistance application for [employer]. Discussed medication management clinic and [facility].  Discussed that could not use both pharmacies and she verbalizes understanding of this. Discussed may be of benefit to review cost of her current medications and may be of benefit to complete applicationfor [facility] to see if she meets criteria.  Instructed her to requests assistance from [facility] care providers [facility] to assist her with Eliquis assistance application.

## 2017-12-08 NOTE — ED Provider Notes (Signed)
[facility] Emergency Department Provider Note  Time seen:  I have reviewed the triage vital signs and the nursing notes.   HISTORY  Chief Complaint Shortness of Breath    HPI Ashley [name] is a 61 y.o. female with below list of chronic medical conditions including COPD presents to the emergency department with progressive dyspnea times 1 day.  Patient states that she used an albuterol breathing treatment at home without any improvement.  EMS also administered an additional while in route to the emergency department with minimal improvement.  Patient presents emergency department with apparent respiratory difficulty however patient able to verbally communicate.  Patient states no chest pain no lower extreme knee pain or swelling.   Past Medical History:  Diagnosis Date  . Anxiety   . COPD (chronic obstructive pulmonary disease) (HCC)   . Hypertension     Patient Active Problem List   Diagnosis Date Noted  . Acute on chronic respiratory failure with hypoxia (HCC) 11/27/2017  . COPD with acute exacerbation (HCC) 07/08/2017  . COPD exacerbation (HCC) 07/08/2017  . Sepsis (HCC) 02/17/2017  . COPD (chronic obstructive pulmonary disease) (HCC) 06/01/2016  . HTN (hypertension) 06/01/2016  . Diarrhea 10/05/2015  . Protein-calorie malnutrition, severe 10/02/2015  . Malnutrition (HCC) 10/02/2015  . Pressure ulcer 10/01/2015  . SVT (supraventricular tachycardia) (HCC) 09/30/2015  . Pneumonia 09/30/2015  . Chest pain 09/22/2015    Past Surgical History:  Procedure Laterality Date  . ABDOMINAL HYSTERECTOMY      Prior to Admission medications   Medication Sig Start Date End Date Taking? Authorizing Provider  albuterol (PROAIR HFA) 108 (90 Base) MCG/ACT inhaler Inhale 2 puffs into the lungs every 6 (six) hours as needed for wheezing or shortness of breath. 06/02/16  Yes Gouru, Aruna, MD  lisinopril (PRINIVIL,ZESTRIL) 5 MG tablet Take 5 mg by mouth daily.    Yes [provider]  tiotropium (SPIRIVA) 18 MCG inhalation capsule Place 18 mcg into inhaler and inhale daily.   Yes [provider]  albuterol (PROVENTIL) (5 MG/ML) 0.5% nebulizer solution Take 0.5 mLs (2.5 mg total) by nebulization every 6 (six) hours as needed for wheezing or shortness of breath. 12/03/17   Ashley BaasKalisetti, Radhika, MD  ALPRAZolam Prudy Feeler(XANAX) 0.25 MG tablet Take 1 tablet (0.25 mg total) by mouth at bedtime as needed for anxiety. 12/03/17   Ashley BaasKalisetti, Radhika, MD  amiodarone (PACERONE) 200 MG tablet Take 1 tablet (200 mg total) by mouth 2 (two) times daily. X 1 week and then change to 200mg  po qdaily 12/03/17   Ashley BaasKalisetti, Radhika, MD  apixaban (ELIQUIS) 5 MG TABS tablet Take 1 tablet (5 mg total) by mouth 2 (two) times daily. 12/03/17   Ashley BaasKalisetti, Radhika, MD  budesonide-formoterol [facility](SYMBICORT) 160-4.5 MCG/ACT inhaler Inhale 2 puffs into the lungs 2 (two) times daily. 12/03/17   Ashley BaasKalisetti, Radhika, MD  docusate sodium (COLACE) 100 MG capsule Take 1 capsule (100 mg total) by mouth 2 (two) times daily. 12/03/17   Ashley BaasKalisetti, Radhika, MD  feeding supplement, ENSURE ENLIVE, (ENSURE ENLIVE) LIQD Take 237 mLs by mouth 3 (three) times daily between meals. 06/02/16   Gouru, Deanna ArtisAruna, MD  predniSONE (DELTASONE) 10 MG tablet 6 tabs PO x 2 days 5 tabs PO x 2 days 4 tabs PO x 2 days 3 tabs PO x 2 days 2 tabs PO x 2 days 1 tab PO x 2 days and stop 12/04/17   Ashley BaasKalisetti, Radhika, MD  promethazine (PHENERGAN) 12.5 MG tablet Take 1 tablet (12.5 mg  total) by mouth every 6 (six) hours as needed for nausea or vomiting. Patient not taking: Reported on 08/08/2017 02/19/17   Auburn Bilberry, MD    Allergies Codeine  Family History  Problem Relation Age of Onset  . CAD Mother   . CAD Father   . Cancer - Prostate Brother   . Cancer - Lung Brother     Social History Social History   Tobacco Use  . Smoking status: Current Every Day Smoker    Packs/day: 0.50    Types: Cigarettes  .  Smokeless tobacco: Never Used  Substance Use Topics  . Alcohol use: No  . Drug use: No    Review of Systems Constitutional: No fever/chills Eyes: No visual changes. ENT: No sore throat. Cardiovascular: Denies chest pain. Respiratory: Positive for shortness of breath. Gastrointestinal: No abdominal pain.  No nausea, no vomiting.  No diarrhea.  No constipation. Genitourinary: Negative for dysuria. Musculoskeletal: Negative for neck pain.  Negative for back pain. Integumentary: Negative for rash. Neurological: Negative for headaches, focal weakness or numbness.   ____________________________________________   PHYSICAL EXAM:  VITAL SIGNS: ED Triage Vitals  Enc Vitals Group     BP 11/27/17 0412 (!) 170/106     Pulse Rate 11/27/17 0412 96     Resp 11/27/17 0412 20     Temp 11/27/17 0412 97.8 F (36.6 C)     Temp Source 11/27/17 0412 Axillary     SpO2 11/27/17 0412 99 %     Weight 11/27/17 0408 35.4 kg (78 lb)     Height 11/27/17 0408 1.575 m (5\' 2" )     Head Circumference --      Peak Flow --      Pain Score 11/27/17 0647 2     Pain Loc --      Pain Edu? --      Excl. in GC? --     Constitutional: Alert and oriented. Apparent respiratory distress Eyes: Conjunctivae are normal.  Head: Atraumatic. Mouth/Throat: Mucous membranes are moist.  Oropharynx non-erythematous. Neck: No stridor.  Cardiovascular: Normal rate, regular rhythm. Good peripheral circulation. Grossly normal heart sounds. Respiratory: Tachypnea, apparent respiratory distress, diffuse rhonchi Gastrointestinal: Soft and nontender. No distention.  Musculoskeletal: No lower extremity tenderness nor edema. No gross deformities of extremities. Neurologic:  Normal speech and language. No gross focal neurologic deficits are appreciated.  Skin:  Skin is warm, dry and intact. No rash noted. Psychiatric: Mood and affect are normal. Speech and behavior are normal.  ____________________________________________     LABS (all labs ordered are listed, but only abnormal results are displayed)  Labs Reviewed  COMPREHENSIVE METABOLIC PANEL - Abnormal; Notable for the following components:      Result Value   Glucose, Bld 118 (*)    Total Protein 6.2 (*)    ALT 12 (*)    All other components within normal limits  BASIC METABOLIC PANEL - Abnormal; Notable for the following components:   Glucose, Bld 134 (*)    Calcium 8.8 (*)    All other components within normal limits  URINALYSIS, COMPLETE (UACMP) WITH MICROSCOPIC - Abnormal; Notable for the following components:   Color, Urine YELLOW (*)    APPearance CLEAR (*)    Leukocytes, UA TRACE (*)    Squamous Epithelial / LPF 0-5 (*)    All other components within normal limits  CBC WITH DIFFERENTIAL/PLATELET  BRAIN NATRIURETIC PEPTIDE  TROPONIN I  TSH  CORTISOL  CBC   ____________________________________________  EKG  ED ECG REPORT I, [place] N Bria Portales, the attending physician, personally viewed and interpreted this ECG.   Date: 12/08/2017  EKG Time: 410 a.m.  Rate: 97  Rhythm: Normal sinus rhythm  Axis: Normal  Intervals: Normal  ST&T Change:   ____________________________________________  RADIOLOGY I, [place] N Jai Steil, personally viewed and evaluated these images (plain radiographs) as part of my medical decision making, as well as reviewing the written report by the radiologist.  CLINICAL DATA:  61 y/o  F; shortness of breath.  History of COPD.  EXAM: PORTABLE CHEST 1 VIEW  COMPARISON:  08/08/2017 chest radiograph  FINDINGS: Normal cardiac silhouette. Hyperinflated lungs, emphysema, and biapical pleuroparenchymal scarring are stable. Aortic atherosclerosis with calcification. No focal consolidation. Bones are unremarkable.  IMPRESSION: Stable COPD with severe emphysema and biapical pleuroparenchymal scarring. No focal consolidation.   Electronically Signed   By: Mitzi HansenLance  Furusawa-Stratton M.D.   On: 11/27/2017  04:49  ____________________________________________   PROCEDURES  Critical Care performed:CRITICAL CARE Performed by: Darci CurrentANDOLPH N Dhani Dannemiller   Total critical care time: 45 minutes  Critical care time was exclusive of separately billable procedures and treating other patients.  Critical care was necessary to treat or prevent imminent or life-threatening deterioration.  Critical care was time spent personally by me on the following activities: development of treatment plan with patient and/or surrogate as well as nursing, discussions with consultants, evaluation of patient's response to treatment, examination of patient, obtaining history from patient or surrogate, ordering and performing treatments and interventions, ordering and review of laboratory studies, ordering and review of radiographic studies, pulse oximetry and re-evaluation of patient's condition.   Procedures   ____________________________________________   INITIAL IMPRESSION / ASSESSMENT AND PLAN / ED COURSE  As part of my medical decision making, I reviewed the following data within the electronic MEDICAL RECORD [id number] year old female with history of COPD presenting to the emergency department with COPD exacerbation with hypoxia.  Patient received additional DuoNeb's in the emergency department as well as Solu-Medrol 125 mg IV with improvement in restaurant status however patient continues to have coarse rhonchi throughout and oxygen demand and as such patient discussed with Dr. Sheryle Haildiamond for hospital admission for further evaluation and management of COPD exacerbation with hypoxia ____________________________________________  FINAL CLINICAL IMPRESSION(S) / ED DIAGNOSES  Acute on chronic respiratory failure COPD exacerbation  MEDICATIONS GIVEN DURING THIS VISIT:  Medications  amiodarone (NEXTERONE) 1.8 mg/mL load via infusion 150 mg (150 mg Intravenous Bolus from Bag 11/30/17 1309)    Followed by  amiodarone (NEXTERONE  PREMIX) 360-4.14 MG/200ML-% (1.8 mg/mL) IV infusion (0 mg/hr Intravenous Stopped 12/01/17 0937)    Followed by  amiodarone (NEXTERONE PREMIX) 360-4.14 MG/200ML-% (1.8 mg/mL) IV infusion (30 mg/hr Intravenous Not Given 12/01/17 0937)  methylPREDNISolone sodium succinate (SOLU-MEDROL) 125 mg/2 mL injection 125 mg (125 mg Intravenous Given 11/27/17 0410)  ipratropium-albuterol (DUONEB) 0.5-2.5 (3) MG/3ML nebulizer solution 3 mL (3 mLs Nebulization Given 11/27/17 0409)  azithromycin (ZITHROMAX) tablet 500 mg (500 mg Oral Given 11/27/17 1100)    Followed by  azithromycin (ZITHROMAX) tablet 250 mg (250 mg Oral Given 12/01/17 0938)  metoprolol tartrate (LOPRESSOR) injection 5 mg (5 mg Intravenous Given 11/30/17 0940)     ED Discharge Orders        Ordered    predniSONE (DELTASONE) 10 MG tablet     12/03/17 0859    docusate sodium (COLACE) 100 MG capsule  2 times daily     12/03/17 0858    apixaban (ELIQUIS) 5 MG TABS tablet  2  times daily     12/03/17 0858    amiodarone (PACERONE) 200 MG tablet  2 times daily     12/03/17 0859    ALPRAZolam (XANAX) 0.25 MG tablet  At bedtime PRN     12/03/17 0859    albuterol (PROVENTIL) (5 MG/ML) 0.5% nebulizer solution  Every 6 hours PRN     12/03/17 0859    budesonide-formoterol (SYMBICORT) 160-4.5 MCG/ACT inhaler  2 times daily     12/03/17 0859    Diet - low sodium heart healthy     12/03/17 0859    Activity as tolerated - No restrictions     12/03/17 0859       Note:  This document was prepared using Dragon voice recognition software and may include unintentional dictation errors.    Darci Current, MD 12/08/17 [id number]

## 2017-12-13 ENCOUNTER — Other Ambulatory Visit: Payer: Self-pay

## 2017-12-13 ENCOUNTER — Emergency Department: Payer: Self-pay

## 2017-12-13 ENCOUNTER — Encounter: Payer: Self-pay | Admitting: Emergency Medicine

## 2017-12-13 ENCOUNTER — Inpatient Hospital Stay
Admission: EM | Admit: 2017-12-13 | Discharge: 2017-12-15 | DRG: 190 | Disposition: A | Discharge status: Home / Self Care | Payer: Self-pay | Attending: Specialist | Admitting: Specialist

## 2017-12-13 DIAGNOSIS — Z7951 Long term (current) use of inhaled steroids: Secondary | ICD-10-CM

## 2017-12-13 DIAGNOSIS — Z9981 Dependence on supplemental oxygen: Secondary | ICD-10-CM

## 2017-12-13 DIAGNOSIS — E43 Unspecified severe protein-calorie malnutrition: Secondary | ICD-10-CM | POA: Diagnosis present

## 2017-12-13 DIAGNOSIS — J9621 Acute and chronic respiratory failure with hypoxia: Secondary | ICD-10-CM | POA: Diagnosis present

## 2017-12-13 DIAGNOSIS — F17210 Nicotine dependence, cigarettes, uncomplicated: Secondary | ICD-10-CM | POA: Diagnosis present

## 2017-12-13 DIAGNOSIS — Z681 Body mass index (BMI) 19 or less, adult: Secondary | ICD-10-CM

## 2017-12-13 DIAGNOSIS — Z885 Allergy status to narcotic agent status: Secondary | ICD-10-CM

## 2017-12-13 DIAGNOSIS — Z23 Encounter for immunization: Secondary | ICD-10-CM

## 2017-12-13 DIAGNOSIS — I482 Chronic atrial fibrillation: Secondary | ICD-10-CM | POA: Diagnosis present

## 2017-12-13 DIAGNOSIS — I10 Essential (primary) hypertension: Secondary | ICD-10-CM | POA: Diagnosis present

## 2017-12-13 DIAGNOSIS — J441 Chronic obstructive pulmonary disease with (acute) exacerbation: Principal | ICD-10-CM | POA: Diagnosis present

## 2017-12-13 DIAGNOSIS — K210 Gastro-esophageal reflux disease with esophagitis: Secondary | ICD-10-CM | POA: Diagnosis present

## 2017-12-13 DIAGNOSIS — Z7901 Long term (current) use of anticoagulants: Secondary | ICD-10-CM

## 2017-12-13 DIAGNOSIS — F419 Anxiety disorder, unspecified: Secondary | ICD-10-CM | POA: Diagnosis present

## 2017-12-13 DIAGNOSIS — Z79899 Other long term (current) drug therapy: Secondary | ICD-10-CM

## 2017-12-13 LAB — TROPONIN I

## 2017-12-13 LAB — CBC WITH DIFFERENTIAL/PLATELET
BASOS ABS: 0 10*3/uL (ref 0–0.1)
Basophils Relative: 1 %
EOS PCT: 0 %
Eosinophils Absolute: 0 10*3/uL (ref 0–0.7)
HCT: 46.2 % (ref 35.0–47.0)
Hemoglobin: 15.5 g/dL (ref 12.0–16.0)
LYMPHS PCT: 19 %
Lymphs Abs: 1.3 10*3/uL (ref 1.0–3.6)
MCH: 30.6 pg (ref 26.0–34.0)
MCHC: 33.5 g/dL (ref 32.0–36.0)
MCV: 91.3 fL (ref 80.0–100.0)
MONO ABS: 0.9 10*3/uL (ref 0.2–0.9)
Monocytes Relative: 13 %
Neutro Abs: 4.8 10*3/uL (ref 1.4–6.5)
Neutrophils Relative %: 67 %
PLATELETS: 208 10*3/uL (ref 150–440)
RBC: 5.06 MIL/uL (ref 3.80–5.20)
RDW: 13.9 % (ref 11.5–14.5)
WBC: 7.1 10*3/uL (ref 3.6–11.0)

## 2017-12-13 LAB — COMPREHENSIVE METABOLIC PANEL
ALK PHOS: 100 U/L (ref 38–126)
ALT: 18 U/L (ref 14–54)
AST: 26 U/L (ref 15–41)
Albumin: 3.8 g/dL (ref 3.5–5.0)
Anion gap: 9 (ref 5–15)
BILIRUBIN TOTAL: 1 mg/dL (ref 0.3–1.2)
BUN: 12 mg/dL (ref 6–20)
CHLORIDE: 99 mmol/L — AB (ref 101–111)
CO2: 31 mmol/L (ref 22–32)
CREATININE: 0.75 mg/dL (ref 0.44–1.00)
Calcium: 9.2 mg/dL (ref 8.9–10.3)
GFR calc Af Amer: >60 mL/min (ref >60)
Glucose, Bld: 118 mg/dL — ABNORMAL HIGH (ref 65–99)
Potassium: 3.9 mmol/L (ref 3.5–5.1)
Sodium: 139 mmol/L (ref 135–145)
Total Protein: 6.9 g/dL (ref 6.5–8.1)

## 2017-12-13 LAB — FIBRIN DERIVATIVES D-DIMER (ARMC ONLY): Fibrin derivatives D-dimer (ARMC): 227.25 ng/mL (FEU) (ref 0.00–499.00)

## 2017-12-13 LAB — BRAIN NATRIURETIC PEPTIDE: B Natriuretic Peptide: 42 pg/mL (ref 0.0–100.0)

## 2017-12-13 MED ORDER — IPRATROPIUM-ALBUTEROL 0.5-2.5 (3) MG/3ML IN SOLN
RESPIRATORY_TRACT | Status: AC
  Filled 2017-12-13: qty 3

## 2017-12-13 MED ORDER — SODIUM CHLORIDE 0.9 % IV SOLN
INTRAVENOUS | Status: DC
  Administered 2017-12-13: 13:00:00 via INTRAVENOUS

## 2017-12-13 MED ORDER — MOMETASONE FURO-FORMOTEROL FUM 200-5 MCG/ACT IN AERO
2.0000 | INHALATION_SPRAY | Freq: Two times a day (BID) | RESPIRATORY_TRACT | Status: DC
  Administered 2017-12-13 – 2017-12-14 (×3): 2 via RESPIRATORY_TRACT
  Filled 2017-12-13: qty 8.8

## 2017-12-13 MED ORDER — ALPRAZOLAM 0.5 MG PO TABS
0.2500 mg | ORAL_TABLET | Freq: Every evening | ORAL | Status: DC | PRN
  Administered 2017-12-13: 0.25 mg via ORAL
  Filled 2017-12-13: qty 1

## 2017-12-13 MED ORDER — IPRATROPIUM-ALBUTEROL 0.5-2.5 (3) MG/3ML IN SOLN
3.0000 mL | Freq: Four times a day (QID) | RESPIRATORY_TRACT | Status: DC
  Administered 2017-12-13 (×2): 3 mL via RESPIRATORY_TRACT
  Filled 2017-12-13 (×2): qty 3

## 2017-12-13 MED ORDER — NICOTINE 21 MG/24HR TD PT24
21.0000 mg | MEDICATED_PATCH | Freq: Every day | TRANSDERMAL | Status: DC
  Administered 2017-12-13 – 2017-12-15 (×3): 21 mg via TRANSDERMAL
  Filled 2017-12-13 (×3): qty 1

## 2017-12-13 MED ORDER — IPRATROPIUM-ALBUTEROL 0.5-2.5 (3) MG/3ML IN SOLN
3.0000 mL | Freq: Once | RESPIRATORY_TRACT | Status: AC
  Administered 2017-12-13: 3 mL via RESPIRATORY_TRACT
  Filled 2017-12-13: qty 3

## 2017-12-13 MED ORDER — TRAMADOL HCL 50 MG PO TABS: 50.0000 mg | ORAL_TABLET | Freq: Four times a day (QID) | ORAL | Status: DC | PRN

## 2017-12-13 MED ORDER — ENSURE ENLIVE PO LIQD
1.0000 | Freq: Three times a day (TID) | ORAL | Status: DC
  Administered 2017-12-13: 237 mL via ORAL

## 2017-12-13 MED ORDER — PANTOPRAZOLE SODIUM 40 MG PO TBEC
40.0000 mg | DELAYED_RELEASE_TABLET | Freq: Every day | ORAL | Status: DC
  Administered 2017-12-13: 13:00:00 40 mg via ORAL
  Filled 2017-12-13 (×3): qty 1

## 2017-12-13 MED ORDER — ADULT MULTIVITAMIN W/MINERALS CH
1.0000 | ORAL_TABLET | Freq: Every day | ORAL | Status: DC
  Administered 2017-12-13: 1 via ORAL
  Filled 2017-12-13 (×3): qty 1

## 2017-12-13 MED ORDER — METHYLPREDNISOLONE SODIUM SUCC 125 MG IJ SOLR
60.0000 mg | Freq: Four times a day (QID) | INTRAMUSCULAR | Status: DC
  Administered 2017-12-13 – 2017-12-14 (×4): 60 mg via INTRAVENOUS
  Filled 2017-12-13 (×4): qty 2

## 2017-12-13 MED ORDER — DOCUSATE SODIUM 100 MG PO CAPS
100.0000 mg | ORAL_CAPSULE | Freq: Two times a day (BID) | ORAL | Status: DC
  Administered 2017-12-13 – 2017-12-15 (×5): 100 mg via ORAL
  Filled 2017-12-13 (×5): qty 1

## 2017-12-13 MED ORDER — TIOTROPIUM BROMIDE MONOHYDRATE 18 MCG IN CAPS
18.0000 ug | ORAL_CAPSULE | Freq: Every day | RESPIRATORY_TRACT | Status: DC
  Administered 2017-12-13 – 2017-12-14 (×2): 18 ug via RESPIRATORY_TRACT
  Filled 2017-12-13: qty 5

## 2017-12-13 MED ORDER — SENNOSIDES-DOCUSATE SODIUM 8.6-50 MG PO TABS: 1.0000 | ORAL_TABLET | Freq: Every evening | ORAL | Status: DC | PRN

## 2017-12-13 MED ORDER — IOPAMIDOL (ISOVUE-370) INJECTION 76%: 75.0000 mL | Freq: Once | INTRAVENOUS | Status: DC | PRN

## 2017-12-13 MED ORDER — INFLUENZA VAC SPLIT HIGH-DOSE 0.5 ML IM SUSY
0.5000 mL | PREFILLED_SYRINGE | INTRAMUSCULAR | Status: AC
  Administered 2017-12-14: 0.5 mL via INTRAMUSCULAR
  Filled 2017-12-13: qty 0.5

## 2017-12-13 MED ORDER — IOPAMIDOL (ISOVUE-370) INJECTION 76%
60.0000 mL | Freq: Once | INTRAVENOUS | Status: AC | PRN
  Administered 2017-12-13: 60 mL via INTRAVENOUS

## 2017-12-13 MED ORDER — ACETAMINOPHEN 650 MG RE SUPP: 650.0000 mg | Freq: Four times a day (QID) | RECTAL | Status: DC | PRN

## 2017-12-13 MED ORDER — ALBUTEROL SULFATE (5 MG/ML) 0.5% IN NEBU
2.5000 mg | INHALATION_SOLUTION | RESPIRATORY_TRACT | Status: DC | PRN
  Filled 2017-12-13: qty 0.5

## 2017-12-13 MED ORDER — ACETAMINOPHEN 325 MG PO TABS: 650.0000 mg | ORAL_TABLET | Freq: Four times a day (QID) | ORAL | Status: DC | PRN

## 2017-12-13 MED ORDER — ONDANSETRON HCL 4 MG PO TABS: 4.0000 mg | ORAL_TABLET | Freq: Four times a day (QID) | ORAL | Status: DC | PRN

## 2017-12-13 MED ORDER — IPRATROPIUM-ALBUTEROL 0.5-2.5 (3) MG/3ML IN SOLN
3.0000 mL | Freq: Once | RESPIRATORY_TRACT | Status: AC
  Administered 2017-12-13: 3 mL via RESPIRATORY_TRACT

## 2017-12-13 MED ORDER — LORAZEPAM 0.5 MG PO TABS
0.5000 mg | ORAL_TABLET | Freq: Once | ORAL | Status: AC
  Administered 2017-12-13: 0.5 mg via ORAL
  Filled 2017-12-13: qty 1

## 2017-12-13 MED ORDER — APIXABAN 5 MG PO TABS
5.0000 mg | ORAL_TABLET | Freq: Two times a day (BID) | ORAL | Status: DC
  Administered 2017-12-13 – 2017-12-15 (×5): 5 mg via ORAL
  Filled 2017-12-13 (×5): qty 1

## 2017-12-13 MED ORDER — AMIODARONE HCL 200 MG PO TABS
200.0000 mg | ORAL_TABLET | Freq: Every day | ORAL | Status: DC
  Administered 2017-12-13 – 2017-12-15 (×3): 200 mg via ORAL
  Filled 2017-12-13 (×3): qty 1

## 2017-12-13 MED ORDER — PROMETHAZINE HCL 25 MG PO TABS
12.5000 mg | ORAL_TABLET | Freq: Four times a day (QID) | ORAL | Status: DC | PRN
  Filled 2017-12-13: qty 1

## 2017-12-13 MED ORDER — ONDANSETRON HCL 4 MG/2ML IJ SOLN: 4.0000 mg | Freq: Four times a day (QID) | INTRAMUSCULAR | Status: DC | PRN

## 2017-12-13 MED ORDER — IPRATROPIUM-ALBUTEROL 0.5-2.5 (3) MG/3ML IN SOLN
3.0000 mL | Freq: Three times a day (TID) | RESPIRATORY_TRACT | Status: DC
  Administered 2017-12-14 – 2017-12-15 (×4): 3 mL via RESPIRATORY_TRACT
  Filled 2017-12-13 (×4): qty 3

## 2017-12-13 NOTE — Plan of Care (Signed)
  Progressing Education: Knowledge of General Education information will improve 12/13/2017 1359 - Progressing by Burnett KanarisPerez, Yosef Krogh N, RN Education: Knowledge of General Education information will improve 12/13/2017 1359 - Progressing by Burnett KanarisPerez, Hiroshi Krummel N, RN Activity: Ability to implement measures to reduce episodes of fatigue will improve 12/13/2017 1359 - Progressing by Burnett KanarisPerez, Emiko Osorto N, RN Ability to tolerate increased activity will improve 12/13/2017 1359 - Progressing by Burnett KanarisPerez, Hartlyn Reigel N, RN Will verbalize the importance of balancing activity with adequate rest periods 12/13/2017 1359 - Progressing by Burnett KanarisPerez, Dontee Jaso N, RN Education: Knowledge of disease or condition will improve 12/13/2017 1359 - Progressing by Burnett KanarisPerez, Estelene Carmack N, RN Knowledge of the prescribed therapeutic regimen will improve 12/13/2017 1359 - Progressing by Burnett KanarisPerez, Eliza Grissinger N, RN Health Behavior/Discharge Planning: Ability to manage health-related needs will improve 12/13/2017 1359 - Progressing by Burnett KanarisPerez, Domique Reardon N, RN Respiratory: Ability to maintain a clear airway will improve 12/13/2017 1359 - Progressing by Burnett KanarisPerez, Sheehan Stacey N, RN Levels of oxygenation will improve 12/13/2017 1359 - Progressing by Burnett KanarisPerez, Cornisha Zetino N, RN Ability to maintain adequate ventilation will improve 12/13/2017 1359 - Progressing by Burnett KanarisPerez, Papa Piercefield N, RN Complications related to the disease process, condition or treatment will be avoided or minimized 12/13/2017 1359 - Progressing by Burnett KanarisPerez, Murat Rideout N, RN

## 2017-12-13 NOTE — H&P (Signed)
[facility] - [place] at [facility]   PATIENT NAME: Ashley [name]    MR#:  [id number]  DATE OF BIRTH:  09/19/1956  DATE OF ADMISSION:  12/13/2017  PRIMARY CARE PHYSICIAN: Toy Cookey, FNP   REQUESTING/REFERRING PHYSICIAN: Arnaldo Natal, MD  CHIEF COMPLAINT:   Sob  HISTORY OF PRESENT ILLNESS:  Ashley [name]  is a 61 y.o. female with a known history of copd , essential hypertension, atrial fibrillation, anxiety is coming to the ED with a chief complaint of shortness of breath. Patient was admitted to the hospital on December 5 and got discharged on December 11 of this month with a similar complaint of COPD exacerbation. Patient is a frequent flyer with a similar complaint .Patient continues to smoke 1 pack a day. Reports that her shortness of breath and cough has been worse from yesterday though she has chronic shortness of breath at her baseline. Lives on 2 L of oxygen via nasal cannula. Patient was given Solu-Medrol and breathing treatments in the hospital ED. CT angiogram of the chest with no pulmonary embolism.  PAST MEDICAL HISTORY:   Past Medical History:  Diagnosis Date  . Anxiety   . COPD (chronic obstructive pulmonary disease) (HCC)   . Hypertension     PAST SURGICAL HISTOIRY:   Past Surgical History:  Procedure Laterality Date  . ABDOMINAL HYSTERECTOMY      SOCIAL HISTORY:   Social History   Tobacco Use  . Smoking status: Current Every Day Smoker    Packs/day: 0.50    Types: Cigarettes  . Smokeless tobacco: Never Used  Substance Use Topics  . Alcohol use: No    FAMILY HISTORY:   Family History  Problem Relation Age of Onset  . CAD Mother   . CAD Father   . Cancer - Prostate Brother   . Cancer - Lung Brother     DRUG ALLERGIES:   Allergies  Allergen Reactions  . Codeine Nausea Only    REVIEW OF SYSTEMS:  CONSTITUTIONAL: No fever, fatigue or weakness.  EYES: No blurred or double vision.  EARS, NOSE, AND THROAT: No  tinnitus or ear pain.  RESPIRATORY: reporting  cough, shortness of breath, wheezing ,denies hemoptysis.  CARDIOVASCULAR: No chest pain, orthopnea, edema.  GASTROINTESTINAL: No nausea, vomiting, diarrhea or abdominal pain.  GENITOURINARY: No dysuria, hematuria.  ENDOCRINE: No polyuria, nocturia,  HEMATOLOGY: No anemia, easy bruising or bleeding SKIN: No rash or lesion. MUSCULOSKELETAL: Reporting low back pain    NEUROLOGIC: No tingling, numbness, weakness.  PSYCHIATRY: No anxiety or depression.   MEDICATIONS AT HOME:   Prior to Admission medications   Medication Sig Start Date End Date Taking? Authorizing Provider  albuterol (PROAIR HFA) 108 (90 Base) MCG/ACT inhaler Inhale 2 puffs into the lungs every 6 (six) hours as needed for wheezing or shortness of breath. 06/02/16  Yes Remi Rester, Deanna Artis, MD  albuterol (PROVENTIL) (5 MG/ML) 0.5% nebulizer solution Take 0.5 mLs (2.5 mg total) by nebulization every 6 (six) hours as needed for wheezing or shortness of breath. 12/03/17  Yes Enid Baas, MD  ALPRAZolam Prudy Feeler) 0.25 MG tablet Take 1 tablet (0.25 mg total) by mouth at bedtime as needed for anxiety. 12/03/17  Yes Enid Baas, MD  amiodarone (PACERONE) 200 MG tablet Take 1 tablet (200 mg total) by mouth 2 (two) times daily. X 1 week and then change to 200mg  po qdaily Patient taking differently: Take 200 mg by mouth daily.  12/03/17  Yes Enid Baas, MD  apixaban Everlene Balls)  5 MG TABS tablet Take 1 tablet (5 mg total) by mouth 2 (two) times daily. 12/03/17  Yes Enid Baas, MD  feeding supplement, ENSURE ENLIVE, (ENSURE ENLIVE) LIQD Take 237 mLs by mouth 3 (three) times daily between meals. 06/02/16  Yes Zaya Kessenich, Deanna Artis, MD  tiotropium (SPIRIVA) 18 MCG inhalation capsule Place 18 mcg into inhaler and inhale daily.   Yes [provider]  budesonide-formoterol (SYMBICORT) 160-4.5 MCG/ACT inhaler Inhale 2 puffs into the lungs 2 (two) times daily. Patient not taking: Reported  on 12/13/2017 12/03/17   Enid Baas, MD  docusate sodium (COLACE) 100 MG capsule Take 1 capsule (100 mg total) by mouth 2 (two) times daily. Patient not taking: Reported on 12/13/2017 12/03/17   Enid Baas, MD  predniSONE (DELTASONE) 10 MG tablet 6 tabs PO x 2 days 5 tabs PO x 2 days 4 tabs PO x 2 days 3 tabs PO x 2 days 2 tabs PO x 2 days 1 tab PO x 2 days and stop Patient not taking: Reported on 12/13/2017 12/04/17   Enid Baas, MD  promethazine (PHENERGAN) 12.5 MG tablet Take 1 tablet (12.5 mg total) by mouth every 6 (six) hours as needed for nausea or vomiting. Patient not taking: Reported on 08/08/2017 02/19/17   Auburn Bilberry, MD      VITAL SIGNS:  Blood pressure 120/68, pulse 90, temperature 98.6 F (37 C), temperature source Oral, resp. rate 18, height 5\' 2"  (1.575 m), weight 34.5 kg (76 lb), SpO2 91 %.  PHYSICAL EXAMINATION:  GENERAL:  61 y.o.-year-old patient lying in the bed with no acute distress.  EYES: Pupils equal, round, reactive to light and accommodation. No scleral icterus. Extraocular muscles intact.  HEENT: Head atraumatic, normocephalic. Oropharynx and nasopharynx clear.  NECK:  Supple, no jugular venous distention. No thyroid enlargement, no tenderness.  LUNGS: Coarse bronchial breath sounds bilaterally, diffuse wheezing, no rales,rhonchi or crepitation. No use of accessory muscles of respiration.  CARDIOVASCULAR: S1, S2 normal. No murmurs, rubs, or gallops.  ABDOMEN: Soft, nontender, nondistended. Bowel sounds present. No organomegaly or mass.  EXTREMITIES: No pedal edema, cyanosis, or clubbing.  NEUROLOGIC: Cranial nerves II through XII are intact. Muscle strength 5/5 in all extremities. Sensation intact. Gait not checked.  PSYCHIATRIC: The patient is alert and oriented x 3.  SKIN: No obvious rash, lesion, or ulcer.   LABORATORY PANEL:   CBC Recent Labs  Lab 12/13/17 0738  WBC 7.1  HGB 15.5  HCT 46.2  PLT 208    ------------------------------------------------------------------------------------------------------------------  Chemistries  Recent Labs  Lab 12/13/17 0738  NA 139  K 3.9  CL 99*  CO2 31  GLUCOSE 118*  BUN 12  CREATININE 0.75  CALCIUM 9.2  AST 26  ALT 18  ALKPHOS 100  BILITOT 1.0   ------------------------------------------------------------------------------------------------------------------  Cardiac Enzymes Recent Labs  Lab 12/13/17 0738  TROPONINI <0.03   ------------------------------------------------------------------------------------------------------------------  RADIOLOGY:  Ct Angio Chest Pe W And/or Wo Contrast  Result Date: 12/13/2017 CLINICAL DATA:  Increasing shortness of breath since last night. History of COPD and atrial fibrillation. Some dry cough. EXAM: CT ANGIOGRAPHY CHEST WITH CONTRAST TECHNIQUE: Multidetector CT imaging of the chest was performed using the standard protocol during bolus administration of intravenous contrast. Multiplanar CT image reconstructions and MIPs were obtained to evaluate the vascular anatomy. CONTRAST:  60mL ISOVUE-370 IOPAMIDOL (ISOVUE-370) INJECTION 76% COMPARISON:  Current chest radiograph.  Chest CT, 10/25/2016. FINDINGS: Cardiovascular: Satisfactory opacification of the pulmonary arteries to the segmental level. No evidence of pulmonary embolism. Normal heart  size. No pericardial effusion. No coronary calcifications visualized. Mild aortic atherosclerosis. No dissection or aneurysm. Mediastinum/Nodes: No neck base, axillary, mediastinal or hilar masses or adenopathy. Trachea is widely patent. There is wall thickening of the distal esophagus. Lungs/Pleura: Advanced emphysema. Stable pleuroparenchymal scarring at the lung apices, greatest in the left upper lobe, with associated retraction of the left oblique fissure anteriorly and superiorly. There is an oval nodule in the left lower lobe, image 92, series 9, increased in  size from the prior exam, currently measuring 6 x 3 x 3 mm, mean 4.5 mm. No other nodules. No evidence of pneumonia. No pulmonary edema. No pleural effusion or pneumothorax. Upper Abdomen: No acute abnormality. Musculoskeletal: No chest wall abnormality. No acute or significant osseous findings. Review of the MIP images confirms the above findings. IMPRESSION: 1. No evidence of a pulmonary embolism. 2. Wall thickening of the distal esophagus. Consider esophagitis if there are consistent clinical symptoms, which could be further assessed with endoscopy or a barium swallow. 3. Advanced emphysema with chronic lung scarring mostly in the left upper lobe. No acute findings in the lungs. 4. Left lower lobe nodule, which appears increased in size when compared to the prior CT. Nodule small with a mean size of 4.5 mm. Recommend follow-up CT in 3-6 months to reassess. 5. Aortic atherosclerosis. Aortic Atherosclerosis (ICD10-I70.0) and Emphysema (ICD10-J43.9). Electronically Signed   By: Amie Portlandavid  Ormond M.D.   On: 12/13/2017 09:29   Dg Chest Portable 1 View  Result Date: 12/13/2017 CLINICAL DATA:  61 year old female with shortness of breath since last night. COPD. EXAM: PORTABLE CHEST 1 VIEW COMPARISON:  11/27/2017 and earlier. FINDINGS: Portable AP upright view at 0739 hours. Stable large lung volumes. Architectural distortion in the left upper lung laterally and in apex is stable. No pneumothorax, pulmonary edema, pleural effusion or acute pulmonary opacity. Stable mediastinal contours, bilateral hilar retraction due to apical volume loss. No acute osseous abnormality identified. IMPRESSION: Stable chronic lung disease. No superimposed acute findings are identified. Electronically Signed   By: Odessa FlemingH  Hall M.D.   On: 12/13/2017 08:21    EKG:   Orders placed or performed during the hospital encounter of 12/13/17  . ED EKG  . ED EKG    IMPRESSION AND PLAN:   Harlin RainSusan Cameron  is a 61 y.o. female with a known history of  copd , essential hypertension, atrial fibrillation, anxiety is coming to the ED with a chief complaint of shortness of breath. Patient was admitted to the hospital on December 5 and got discharged on December 11 of this month with a similar complaint of COPD exacerbation    # Acute on chronic respiratory failure- secondary to COPD exacerbation Admit patient to MedSurg unit  IV steroids and bronchodilator therapy Patient should stop smoking Meets COPD Gold criteria  # .Chronic atrial fibrillation Continue amiodarone and eliquis  #Anxiety continue her home medications Xanax  #Essential hypertension Currently patient is not on any antihypertensives  #Severe malnutrition Continue ensure feeding supplements and dietitian consult   #Esophagitis CT angiogram has revealed esophagitis. Patient is started on PPI. Recommend outpatient GI follow-up  #Tobacco abuse disorder Counseled patient to quit smoking for 5 minutes. She is agreeable with the nicotine patch   GI prophylaxis with Protonix DVT prophylaxis with eliquis     All the records are reviewed and case discussed with ED provider. Management plans discussed with the patient, family and they are in agreement.  CODE STATUS: fc, husband HCPOA  TOTAL TIME TAKING CARE  OF THIS PATIENT:  minutes.   Note: This dictation was prepared with Dragon dictation along with smaller phrase technology. Any transcriptional errors that result from this process are unintentional.  Ramonita LabGouru, Gonzalo Waymire M.D on 12/13/2017 at 11:36 AM  Between 7am to 6pm - Pager - [phone]  After 6pm go to [email or web address] - password [email or web address] [facility]  [facility]  Office  [phone]  CC: Primary care physician; [name], [name], FNP

## 2017-12-13 NOTE — Care Management (Signed)
[facility] 11/27/2017. Readmitted 12/21 for COPD exacerbation again. Patient was declined last admission for charity care through [facility] so she has no home health services. She [facility] but has been given an application to medication management clinic.  Will follow progression.

## 2017-12-13 NOTE — ED Notes (Signed)
Patient very anxious and jumpy laying in bed. Pt reports she normally takes a xanax at night. MD made aware. MD ordered 0.5mg  ativan. [facility]

## 2017-12-13 NOTE — ED Triage Notes (Signed)
Patient from home via [facility]. Patient reports increasing shortness of breath since last night with no relief from home meds. Patient has history of COPD and afib. Recent hospitalization for similar symptoms. Patient alert and oriented x4. Speaking in short sentences with some accessory muscle use noted.

## 2017-12-13 NOTE — Progress Notes (Signed)
Initial Nutrition Assessment  DOCUMENTATION CODES:   Severe malnutrition in context of chronic illness, Underweight  INTERVENTION:   -Continue Ensure Enlive po TID, each supplement provides 350 kcal and 20 grams of protein -MVI daily  NUTRITION DIAGNOSIS:   Severe Malnutrition related to chronic illness(COPD) as evidenced by severe fat depletion, severe muscle depletion.  GOAL:   Patient will meet greater than or equal to 90% of their needs  MONITOR:   PO intake, Supplement acceptance, Labs, Weight trends, Skin, I & O's  REASON FOR ASSESSMENT:   Consult, Malnutrition Screening Tool Assessment of nutrition requirement/status  ASSESSMENT:   Ashley [name]  is a 61 y.o. female with a known history of copd , essential hypertension, atrial fibrillation, anxiety is coming to the ED with a chief complaint of shortness of breath. Patient was admitted to the hospital on December 5 and got discharged on December 11 of this month with a similar complaint of COPD exacerbation  Pt admitted with COPD exacerbation.   Spoke with pt and husband at bedside. Pt reports "I really like to eat". Intake has been decreased over the past 2-3 days PTA due to feeling unwell. Pt typically consumes 3 meals per day (Breakfast: oatmeal, Lunch: sandwich, Snacks: peanut butter, Dinner: TV dinner, value meal from fast food restaurant, or meat, starch and vegetable). Pt shares that she usually consumes 1-2 Ensure supplements daily, but will sometime rotate between milkshakes or Carnation Instant Breakfast if she decides not to drink Ensure.   Per pt husband, pt had an extensive hospitalization for pneumonia approximately 3 years ago which caused her to lose a significant amount of weight. Pt's weight has been fairly stable over the past year, however, pt struggles with re-gaining lost weight. Pt has always been petite, never weighing more than 100#.   Discussed importance of good meal and supplement intake to promote  healing. Discussed ways pt could increase calories and protein in diet. Pt consuming milkshake from [employer] at time of visit.   Medications reviewed and include solu-medrol.   Labs reviewed.   NUTRITION - FOCUSED PHYSICAL EXAM:    Most Recent Value  Orbital Region  Severe depletion  Upper Arm Region  Severe depletion  Thoracic and Lumbar Region  Severe depletion  Buccal Region  Severe depletion  Temple Region  Severe depletion  Clavicle Bone Region  Severe depletion  Clavicle and Acromion Bone Region  Severe depletion  Scapular Bone Region  Severe depletion  Dorsal Hand  Severe depletion  Patellar Region  Severe depletion  Anterior Thigh Region  Severe depletion  Posterior Calf Region  Severe depletion  Edema (RD Assessment)  None  Hair  Reviewed  Eyes  Reviewed  Mouth  Reviewed  Skin  Reviewed  Nails  Reviewed       Diet Order:  Diet Heart Room service appropriate? Yes; Fluid consistency: Thin  EDUCATION NEEDS:   Education needs have been addressed  Skin:  Skin Assessment: Reviewed RN Assessment  Last BM:  PTA  Height:   Ht Readings from Last 1 Encounters:  12/13/17 5\' 2"  (1.575 m)    Weight:   Wt Readings from Last 1 Encounters:  12/13/17 74 lb 12.8 oz (33.9 kg)    Ideal Body Weight:  50 kg  BMI:  Body mass index is 13.68 kg/m.  Estimated Nutritional Needs:   Kcal:  1200-1400  Protein:  55-70 grams  Fluid:  1.2-1.4 L    Demaria Deeney A. Mayford KnifeWilliams, RD, LDN, CDE Pager: [phone] After hours  Pager: [phone]

## 2017-12-13 NOTE — ED Provider Notes (Signed)
[facility] Emergency Department Provider Note   ____________________________________________   First MD Initiated Contact with Patient 12/13/17 [phone]     (approximate)  I have reviewed the triage vital signs and the nursing notes.   HISTORY  Chief Complaint Shortness of Breath    HPI Ashley [name] is a 61 y.o. female Who reports she was discharged from the hospital about a week and a half ago after 6 day stay for COPD exacerbation. Patient reports she's been increasingly short of breath for the last day  or so. Shortness breath started yesterday got worse last night she has a dry nonproductive cough and reports she has pain in her back when she does cough. She received 125 Simon on one DuoNeb in the ambulancewith some improvement. She reports she usually uses 1-1/2-2 L of oxygen at home but says she does not have much she supposed to be on. She is able to talk in full sentences.   Past Medical History:  Diagnosis Date  . Anxiety   . COPD (chronic obstructive pulmonary disease) (HCC)   . Hypertension     Patient Active Problem List   Diagnosis Date Noted  . Acute on chronic respiratory failure with hypoxia (HCC) 11/27/2017  . COPD with acute exacerbation (HCC) 07/08/2017  . COPD exacerbation (HCC) 07/08/2017  . Sepsis (HCC) 02/17/2017  . COPD (chronic obstructive pulmonary disease) (HCC) 06/01/2016  . HTN (hypertension) 06/01/2016  . Diarrhea 10/05/2015  . Protein-calorie malnutrition, severe 10/02/2015  . Malnutrition (HCC) 10/02/2015  . Pressure ulcer 10/01/2015  . SVT (supraventricular tachycardia) (HCC) 09/30/2015  . Pneumonia 09/30/2015  . Chest pain 09/22/2015    Past Surgical History:  Procedure Laterality Date  . ABDOMINAL HYSTERECTOMY      Prior to Admission medications   Medication Sig Start Date End Date Taking? Authorizing Provider  albuterol (PROAIR HFA) 108 (90 Base) MCG/ACT inhaler Inhale 2 puffs into the lungs every 6 (six)  hours as needed for wheezing or shortness of breath. 06/02/16  Yes Gouru, Deanna Artis, MD  albuterol (PROVENTIL) (5 MG/ML) 0.5% nebulizer solution Take 0.5 mLs (2.5 mg total) by nebulization every 6 (six) hours as needed for wheezing or shortness of breath. 12/03/17  Yes Enid Baas, MD  ALPRAZolam Prudy Feeler) 0.25 MG tablet Take 1 tablet (0.25 mg total) by mouth at bedtime as needed for anxiety. 12/03/17  Yes Enid Baas, MD  amiodarone (PACERONE) 200 MG tablet Take 1 tablet (200 mg total) by mouth 2 (two) times daily. X 1 week and then change to 200mg  po qdaily Patient taking differently: Take 200 mg by mouth daily.  12/03/17  Yes Enid Baas, MD  apixaban (ELIQUIS) 5 MG TABS tablet Take 1 tablet (5 mg total) by mouth 2 (two) times daily. 12/03/17  Yes Enid Baas, MD  feeding supplement, ENSURE ENLIVE, (ENSURE ENLIVE) LIQD Take 237 mLs by mouth 3 (three) times daily between meals. 06/02/16  Yes Gouru, Deanna Artis, MD  tiotropium (SPIRIVA) 18 MCG inhalation capsule Place 18 mcg into inhaler and inhale daily.   Yes [provider]  budesonide-formoterol (SYMBICORT) 160-4.5 MCG/ACT inhaler Inhale 2 puffs into the lungs 2 (two) times daily. Patient not taking: Reported on 12/13/2017 12/03/17   Enid Baas, MD  docusate sodium (COLACE) 100 MG capsule Take 1 capsule (100 mg total) by mouth 2 (two) times daily. Patient not taking: Reported on 12/13/2017 12/03/17   Enid Baas, MD  predniSONE (DELTASONE) 10 MG tablet 6 tabs PO x 2 days 5 tabs PO  x 2 days 4 tabs PO x 2 days 3 tabs PO x 2 days 2 tabs PO x 2 days 1 tab PO x 2 days and stop Patient not taking: Reported on 12/13/2017 12/04/17   Enid BaasKalisetti, Radhika, MD  promethazine (PHENERGAN) 12.5 MG tablet Take 1 tablet (12.5 mg total) by mouth every 6 (six) hours as needed for nausea or vomiting. Patient not taking: Reported on 08/08/2017 02/19/17   Auburn BilberryPatel, Shreyang, MD    Allergies Codeine  Family History  Problem  Relation Age of Onset  . CAD Mother   . CAD Father   . Cancer - Prostate Brother   . Cancer - Lung Brother     Social History Social History   Tobacco Use  . Smoking status: Current Every Day Smoker    Packs/day: 0.50    Types: Cigarettes  . Smokeless tobacco: Never Used  Substance Use Topics  . Alcohol use: No  . Drug use: No    Review of Systems  Constitutional: No fever/chills Eyes: No visual changes. ENT: No sore throat. Cardiovascular: Denies chest pain except for in the back with cough as noted in history of present illness. Respiratory: Denies shortness of breath. Gastrointestinal: No abdominal pain.  No nausea, no vomiting.  No diarrhea.  No constipation. Genitourinary: Negative for dysuria. Musculoskeletal: Negative for back pain. Skin: Negative for rash. Neurological: Negative for headaches, focal weakness  ____________________________________________   PHYSICAL EXAM:  VITAL SIGNS: ED Triage Vitals  Enc Vitals Group     BP 12/13/17 0734 (!) 139/113     Pulse Rate 12/13/17 0734 96     Resp 12/13/17 0734 (!) 25     Temp 12/13/17 0734 98.6 F (37 C)     Temp Source 12/13/17 0734 Oral     SpO2 12/13/17 0734 100 %     Weight 12/13/17 0735 76 lb (34.5 kg)     Height 12/13/17 0735 5\' 2"  (1.575 m)     Head Circumference --      Peak Flow --      Pain Score --      Pain Loc --      Pain Edu? --      Excl. in GC? --     Constitutional: Alert and oriented. Well appearing and in mild respiratory distress Eyes: Conjunctivae are normal.  Head: Atraumatic. Nose: No congestion/rhinnorhea. Mouth/Throat: Mucous membranes are moist.  Oropharynx non-erythematous. Neck: No stridor.      Cardiovascular: Normal rate, regular rhythm. Grossly normal heart sounds.  Good peripheral circulation. Respiratory: Normal respiratory effort.  No retractions. Lungs CTAB. Gastrointestinal: Soft and nontender. No distention. No abdominal bruits. No CVA  tenderness. }Musculoskeletal: No lower extremity tenderness nor edema.  No joint effusions. Neurologic:  Normal speech and language. No gross focal neurologic deficits are appreciated. Skin:  Skin is warm, dry and intact. No rash noted.   ____________________________________________   LABS (all labs ordered are listed, but only abnormal results are displayed)  Labs Reviewed  COMPREHENSIVE METABOLIC PANEL - Abnormal; Notable for the following components:      Result Value   Chloride 99 (*)    Glucose, Bld 118 (*)    All other components within normal limits  TROPONIN I  CBC WITH DIFFERENTIAL/PLATELET  FIBRIN DERIVATIVES D-DIMER ([facility] ONLY)  BRAIN NATRIURETIC PEPTIDE   ____________________________________________  EKG  EKG read and interpreted by me shows normal sinus rhythm rate of 95 normal axis by age with enlargement otherwise no acute changes ____________________________________________  RADIOLOGY chest x-ray shows extreme hyperexpansion no apparent infiltrate radiology agrees _CT of the chest read as severe emphysema no acute disease there is a lung nodule that has gone slightly that we'll need reassessment in 3-6 months  ___________________________________________   PROCEDURES  Procedure(s) performed:  Procedures  Critical Care performed:   ____________________________________________   INITIAL IMPRESSION / ASSESSMENT AND PLAN / ED COURSE  old records reviewedEKG reviewed from previous visit present EKG is similar except for now she is in sinus rhythm instead of A. Fib  ----------------------------------------- 10:39 AM on 12/13/2017 -----------------------------------------  Patient reports she is feeling a little better thinking a little better now. She says she uses her oxygen as needed and usually does not use the oxygen while she is at home. Walking her in the emergency room today she dropped down to 88% on room air. This is worse than what she usually  does. She says she feels very bad and very weak since she becomes hypoxic and is very shaky and weak we will see if we can put her in the hospital for some more treatment. I will give her one more breathing treatment this will make the fourth breathing treatment since EMS picked her up.      ____________________________________________   FINAL CLINICAL IMPRESSION(S) / ED DIAGNOSES  Final diagnoses:  COPD exacerbation [facility](HCC)     ED Discharge Orders    None       Note:  This document was prepared using Dragon voice recognition software and may include unintentional dictation errors.    Arnaldo NatalMalinda, Deletha Jaffee F, MD 12/13/17 1040

## 2017-12-13 NOTE — Progress Notes (Signed)
Nebulizer order changed per Respiratory Assessment Protocol score of 6

## 2017-12-13 NOTE — ED Notes (Signed)
Admitting MD at beside. Pt has family at bedside as well. Pt is A/O and conversational.

## 2017-12-14 LAB — BASIC METABOLIC PANEL
ANION GAP: 4 — AB (ref 5–15)
BUN: 17 mg/dL (ref 6–20)
CO2: 30 mmol/L (ref 22–32)
Calcium: 9.1 mg/dL (ref 8.9–10.3)
Chloride: 109 mmol/L (ref 101–111)
Creatinine, Ser: 0.48 mg/dL (ref 0.44–1.00)
GFR calc Af Amer: >60 mL/min (ref >60)
GFR calc non Af Amer: >60 mL/min (ref >60)
GLUCOSE: 136 mg/dL — AB (ref 65–99)
POTASSIUM: 4.2 mmol/L (ref 3.5–5.1)
Sodium: 143 mmol/L (ref 135–145)

## 2017-12-14 LAB — CBC
HEMATOCRIT: 38.4 % (ref 35.0–47.0)
HEMOGLOBIN: 12.9 g/dL (ref 12.0–16.0)
MCH: 30.7 pg (ref 26.0–34.0)
MCHC: 33.5 g/dL (ref 32.0–36.0)
MCV: 91.5 fL (ref 80.0–100.0)
Platelets: 155 10*3/uL (ref 150–440)
RBC: 4.2 MIL/uL (ref 3.80–5.20)
RDW: 13.9 % (ref 11.5–14.5)
WBC: 12.6 10*3/uL — AB (ref 3.6–11.0)

## 2017-12-14 MED ORDER — ALPRAZOLAM 0.25 MG PO TABS
0.2500 mg | ORAL_TABLET | Freq: Three times a day (TID) | ORAL | Status: DC | PRN
  Administered 2017-12-14 – 2017-12-15 (×2): 0.25 mg via ORAL
  Filled 2017-12-14 (×2): qty 1

## 2017-12-14 MED ORDER — BUDESONIDE 0.5 MG/2ML IN SUSP
0.5000 mg | Freq: Two times a day (BID) | RESPIRATORY_TRACT | Status: DC
  Administered 2017-12-14 – 2017-12-15 (×2): 0.5 mg via RESPIRATORY_TRACT
  Filled 2017-12-14 (×2): qty 2

## 2017-12-14 MED ORDER — METHYLPREDNISOLONE SODIUM SUCC 125 MG IJ SOLR
60.0000 mg | Freq: Two times a day (BID) | INTRAMUSCULAR | Status: DC
  Administered 2017-12-14 – 2017-12-15 (×2): 60 mg via INTRAVENOUS
  Filled 2017-12-14 (×2): qty 2

## 2017-12-14 NOTE — Progress Notes (Signed)
[employer] [facility] [place] [facility]   PATIENT NAME: Ashley [name]    MR#:  [id number]  DATE OF BIRTH:  November 30, 1956  SUBJECTIVE:   Patient here due to acute on chronic respiratory failure with hypoxia secondary to COPD exacerbation. Feels a little bit better today. Still has some cough, shortness of breath. Seems a bit anxious.  REVIEW OF SYSTEMS:    Review of Systems  Constitutional: Negative for chills and fever.  HENT: Negative for congestion and tinnitus.   Eyes: Negative for blurred vision and double vision.  Respiratory: Positive for cough and shortness of breath. Negative for wheezing.   Cardiovascular: Negative for chest pain, orthopnea and PND.  Gastrointestinal: Negative for abdominal pain, diarrhea, nausea and vomiting.  Genitourinary: Negative for dysuria and hematuria.  Neurological: Negative for dizziness, sensory change and focal weakness.  Psychiatric/Behavioral: The patient is nervous/anxious.   All other systems reviewed and are negative.   Nutrition: Heart healthy Tolerating Diet: Yes Tolerating PT: Ambulatory   DRUG ALLERGIES:   Allergies  Allergen Reactions  . Codeine Nausea Only    VITALS:  Blood pressure (!) 113/56, pulse 67, temperature 98.2 F (36.8 C), temperature source Oral, resp. rate 18, height 5\' 2"  (1.575 m), weight 33.9 kg (74 lb 12.8 oz), SpO2 100 %.  PHYSICAL EXAMINATION:   Physical Exam  GENERAL:  61 y.o.-year-old patient lying in bed in mild Resp. Distress.  EYES: Pupils equal, round, reactive to light and accommodation. No scleral icterus. Extraocular muscles intact.  HEENT: Head atraumatic, normocephalic. Oropharynx and nasopharynx clear.  NECK:  Supple, no jugular venous distention. No thyroid enlargement, no tenderness.  LUNGS: Good air entry bilaterally, prolonged inspiratory and expiratory phase. Minimal end expiratory wheezing bilaterally. No rhonchi. Negative use of accessory muscles. CARDIOVASCULAR: S1, S2  normal. No murmurs, rubs, or gallops.  ABDOMEN: Soft, nontender, nondistended. Bowel sounds present. No organomegaly or mass.  EXTREMITIES: No cyanosis, clubbing or edema b/l.    NEUROLOGIC: Cranial nerves II through XII are intact. No focal Motor or sensory deficits b/l.   PSYCHIATRIC: The patient is alert and oriented x 3. Anxious SKIN: No obvious rash, lesion, or ulcer.    LABORATORY PANEL:   CBC Recent Labs  Lab 12/14/17 0556  WBC 12.6*  HGB 12.9  HCT 38.4  PLT 155   ------------------------------------------------------------------------------------------------------------------  Chemistries  Recent Labs  Lab 12/13/17 0738 12/14/17 0556  NA 139 143  K 3.9 4.2  CL 99* 109  CO2 31 30  GLUCOSE 118* 136*  BUN 12 17  CREATININE 0.75 0.48  CALCIUM 9.2 9.1  AST 26  --   ALT 18  --   ALKPHOS 100  --   BILITOT 1.0  --    ------------------------------------------------------------------------------------------------------------------  Cardiac Enzymes Recent Labs  Lab 12/13/17 0738  TROPONINI <0.03   ------------------------------------------------------------------------------------------------------------------  RADIOLOGY:  Ct Angio Chest Pe W And/or Wo Contrast  Result Date: 12/13/2017 CLINICAL DATA:  Increasing shortness of breath since last night. History of COPD and atrial fibrillation. Some dry cough. EXAM: CT ANGIOGRAPHY CHEST WITH CONTRAST TECHNIQUE: Multidetector CT imaging of the chest was performed using the standard protocol during bolus administration of intravenous contrast. Multiplanar CT image reconstructions and MIPs were obtained to evaluate the vascular anatomy. CONTRAST:  60mL ISOVUE-370 IOPAMIDOL (ISOVUE-370) INJECTION 76% COMPARISON:  Current chest radiograph.  Chest CT, 10/25/2016. FINDINGS: Cardiovascular: Satisfactory opacification of the pulmonary arteries to the segmental level. No evidence of pulmonary embolism. Normal heart size. No  pericardial effusion. No coronary  calcifications visualized. Mild aortic atherosclerosis. No dissection or aneurysm. Mediastinum/Nodes: No neck base, axillary, mediastinal or hilar masses or adenopathy. Trachea is widely patent. There is wall thickening of the distal esophagus. Lungs/Pleura: Advanced emphysema. Stable pleuroparenchymal scarring at the lung apices, greatest in the left upper lobe, with associated retraction of the left oblique fissure anteriorly and superiorly. There is an oval nodule in the left lower lobe, image 92, series 9, increased in size from the prior exam, currently measuring 6 x 3 x 3 mm, mean 4.5 mm. No other nodules. No evidence of pneumonia. No pulmonary edema. No pleural effusion or pneumothorax. Upper Abdomen: No acute abnormality. Musculoskeletal: No chest wall abnormality. No acute or significant osseous findings. Review of the MIP images confirms the above findings. IMPRESSION: 1. No evidence of a pulmonary embolism. 2. Wall thickening of the distal esophagus. Consider esophagitis if there are consistent clinical symptoms, which could be further assessed with endoscopy or a barium swallow. 3. Advanced emphysema with chronic lung scarring mostly in the left upper lobe. No acute findings in the lungs. 4. Left lower lobe nodule, which appears increased in size when compared to the prior CT. Nodule small with a mean size of 4.5 mm. Recommend follow-up CT in 3-6 months to reassess. 5. Aortic atherosclerosis. Aortic Atherosclerosis (ICD10-I70.0) and Emphysema (ICD10-J43.9). Electronically Signed   By: Amie Portlandavid  Ormond M.D.   On: 12/13/2017 09:29   Dg Chest Portable 1 View  Result Date: 12/13/2017 CLINICAL DATA:  61 year old female with shortness of breath since last night. COPD. EXAM: PORTABLE CHEST 1 VIEW COMPARISON:  11/27/2017 and earlier. FINDINGS: Portable AP upright view at 0739 hours. Stable large lung volumes. Architectural distortion in the left upper lung laterally and in  apex is stable. No pneumothorax, pulmonary edema, pleural effusion or acute pulmonary opacity. Stable mediastinal contours, bilateral hilar retraction due to apical volume loss. No acute osseous abnormality identified. IMPRESSION: Stable chronic lung disease. No superimposed acute findings are identified. Electronically Signed   By: Odessa FlemingH  Hall M.D.   On: 12/13/2017 08:21     ASSESSMENT AND PLAN:   61 year old female with past medical history of COPD on home oxygen, anxiety, atrial fibrillation, essential hypertension who presented to the hospital due to shortness of breath.  1. Acute on chronic respiratory failure with hypoxia-secondary to COPD exacerbation. -Continue IV steroids but will taper the dose, continue duo nebs, will add Pulmicort nebs. -Patient is oriented oxygen at home. Chest x-ray was negative for acute pneumonia.  2. COPD exacerbation-this causes of patient's worsening shortness of breath combined with underlying anxiety. -Continue IV steroids, scheduled DuoNeb's, Pulmicort nebs.  3. Anxiety-continue Xanax as needed.  4. Chronic afibrillation-rate controlled. -Continue amiodarone, continue Eliquis.  5. GERD-continue Protonix.  6. Tobacco abuse-continue nicotine patch.   All the records are reviewed and case discussed with Care Management/Social Worker. Management plans discussed with the patient, family and they are in agreement.  CODE STATUS: Full code  DVT Prophylaxis: Eliquis  TOTAL TIME TAKING CARE OF THIS PATIENT: 30 minutes.   POSSIBLE D/C IN 1-2 DAYS, DEPENDING ON CLINICAL CONDITION.   Houston SirenSAINANI,VIVEK J M.D on 12/14/2017 at 12:45 PM  Between 7am to 6pm - Pager - [phone]  After 6pm go to [email or web address] - Scientist, research (life sciences)password EPAS [facility]  [facility] [place] [facility]  Office  [phone]  CC: Primary care physician; [name], [name], FNP

## 2017-12-14 NOTE — Plan of Care (Signed)
  Activity: Ability to implement measures to reduce episodes of fatigue will improve 12/14/2017 1517 - Progressing by Garwin Brothershomas, Anzlee Hinesley Lynn, RN  Pt's IVF discontinued this shift per MD order; pt able to ambulate oob to br with less fatigue this shift

## 2017-12-14 NOTE — Plan of Care (Signed)
  Respiratory: Ability to maintain a clear airway will improve 12/14/2017 1517 - Progressing by Garwin Brothershomas, Raneem Mendolia Lynn, RN  Dr Cherlynn KaiserSainani ordered medication changes this shift in order to improve pt's respiratory effort

## 2017-12-15 MED ORDER — IPRATROPIUM-ALBUTEROL 0.5-2.5 (3) MG/3ML IN SOLN: 3.0000 mL | Freq: Four times a day (QID) | RESPIRATORY_TRACT | 1 refills | Status: DC | PRN

## 2017-12-15 MED ORDER — PREDNISONE 10 MG PO TABS: ORAL_TABLET | ORAL | 0 refills | Status: DC

## 2017-12-15 MED ORDER — AMIODARONE HCL 200 MG PO TABS: 200.0000 mg | ORAL_TABLET | Freq: Every day | ORAL | Status: AC

## 2017-12-15 MED ORDER — BUDESONIDE-FORMOTEROL FUMARATE 160-4.5 MCG/ACT IN AERO: 2.0000 | INHALATION_SPRAY | Freq: Two times a day (BID) | RESPIRATORY_TRACT | 2 refills | Status: DC

## 2017-12-15 NOTE — Progress Notes (Signed)
Pt discharged via wheelchair by nursing to the visitor's entrance 

## 2017-12-15 NOTE — Care Management Note (Signed)
Case Management Note  Patient Details  Name: Ashley [name] MRN: [id number] Date of Birth: 28-May-1956  Subjective/Objective:     Uninsured  [name] was provided with a MATCH coupon for medication assistance, and was given applications to the [facility] and [facility]. [name] reports that she already has a home nebulizer and home 02 provided by [facility].          Action/Plan:   Expected Discharge Date:  12/15/17               Expected Discharge Plan:     In-House Referral:     Discharge planning Services     Post Acute Care Choice:    Choice offered to:     DME Arranged:    DME Agency:     HH Arranged:    HH Agency:     Status of Service:     If discussed at [employer] of [employer] Meetings, dates discussed:    Additional Comments:  Deloise Marchant A, RN 12/15/2017, 10:33 AM

## 2017-12-15 NOTE — Discharge Instructions (Signed)
[facility] - home oxygen °

## 2017-12-15 NOTE — Progress Notes (Signed)
MD order received to discharge pt home with home health today; see separate note from Care Management regarding home health; verbally reviewed AVS with pt; no questions voiced at this time

## 2017-12-15 NOTE — Discharge Summary (Signed)
[employer] [facility]   PATIENT NAME: Ashley [name]    MR#:  [id number]  DATE OF BIRTH:  26-Mar-1956  DATE OF ADMISSION:  12/13/2017 ADMITTING PHYSICIAN: Ramonita Lab, MD  DATE OF DISCHARGE: 12/15/2017  PRIMARY CARE PHYSICIAN: Toy Cookey, FNP    ADMISSION DIAGNOSIS:  COPD exacerbation (HCC) [J44.1]  DISCHARGE DIAGNOSIS:  Active Problems:   COPD with acute exacerbation (HCC)   SECONDARY DIAGNOSIS:   Past Medical History:  Diagnosis Date  . Anxiety   . COPD (chronic obstructive pulmonary disease) (HCC)   . Hypertension     HOSPITAL COURSE:   61 year old female with past medical history of COPD on home oxygen, anxiety, atrial fibrillation, essential hypertension who presented to the hospital due to shortness of breath.  1. Acute on chronic respiratory failure with hypoxia-secondary to COPD exacerbation. -Patient was admitted to the hospital started on IV steroids, scheduled DuoNeb nebs, Pulmicort nebs. She has clinically improved with aggressive therapy. Her chest x-ray was negative for pneumonia. -She is being discharged home with a long prednisone taper, DuoNeb nebs as needed, her maintenance inhalers and oxygen.  2. COPD exacerbation-this was the causes of patient's worsening shortness of breath combined with underlying anxiety. -Patient was treated aggressively with IV steroids, scheduled DuoNeb's, Pulmicort nebs. She has improved now and being discharged on oral prednisone taper, when necessary DuoNeb's, maintenance of inhalers with Symbicort and Spiriva at home. She will continue Xanax at bedtime for anxiety.  3. Anxiety- She will cont. Her Xanax at bedtime.   4. Chronic afibrillation-rate controlled and pt. Will cont. Amiodarone - will continue Eliquis.  5. Tobacco abuse- while in the hospital pt. Was on nicotine patch. She was strongly advised to quit smoking.     DISCHARGE CONDITIONS:   Stable  CONSULTS OBTAINED:     DRUG ALLERGIES:   Allergies  Allergen Reactions  . Codeine Nausea Only    DISCHARGE MEDICATIONS:   Allergies as of 12/15/2017      Reactions   Codeine Nausea Only      Medication List    STOP taking these medications   promethazine 12.5 MG tablet Commonly known as:  PHENERGAN     TAKE these medications   albuterol 108 (90 Base) MCG/ACT inhaler Commonly known as:  PROAIR HFA Inhale 2 puffs into the lungs every 6 (six) hours as needed for wheezing or shortness of breath. What changed:  Another medication with the same name was removed. Continue taking this medication, and follow the directions you see here.   ALPRAZolam 0.25 MG tablet Commonly known as:  XANAX Take 1 tablet (0.25 mg total) by mouth at bedtime as needed for anxiety.   amiodarone 200 MG tablet Commonly known as:  PACERONE Take 1 tablet (200 mg total) by mouth daily.   apixaban 5 MG Tabs tablet Commonly known as:  ELIQUIS Take 1 tablet (5 mg total) by mouth 2 (two) times daily.   budesonide-formoterol 160-4.5 MCG/ACT inhaler Commonly known as:  SYMBICORT Inhale 2 puffs into the lungs 2 (two) times daily.   docusate sodium 100 MG capsule Commonly known as:  COLACE Take 1 capsule (100 mg total) by mouth 2 (two) times daily.   feeding supplement (ENSURE ENLIVE) Liqd Take 237 mLs by mouth 3 (three) times daily between meals.   ipratropium-albuterol 0.5-2.5 (3) MG/3ML Soln Commonly known as:  DUONEB Take 3 mLs by nebulization every 6 (six) hours as needed.   predniSONE 10 MG tablet Commonly known as:  DELTASONE Label  & dispense according to the schedule below. 5 Pills PO for 2 days then, 4 Pills PO for 2 days, 3 Pills PO for 2 days, 2 Pills PO for 2 days, 1 Pill PO for 2 days then STOP. What changed:  additional instructions   tiotropium 18 MCG inhalation capsule Commonly known as:  SPIRIVA Place 18 mcg into inhaler and inhale daily.            Durable Medical Equipment  (From  admission, onward)        Start     Ordered   12/15/17 0000  DME Nebulizer machine    Question:  Patient needs a nebulizer to treat with the following condition  Answer:  COPD (chronic obstructive pulmonary disease) (HCC)   12/15/17 1018        DISCHARGE INSTRUCTIONS:   DIET:  Regular diet  DISCHARGE CONDITION:  Stable  ACTIVITY:  Activity as tolerated  OXYGEN:  Home Oxygen: Yes.     Oxygen Delivery: 5 liters/min via Patient connected to nasal cannula oxygen  DISCHARGE LOCATION:  home   If you experience worsening of your admission symptoms, develop shortness of breath, life threatening emergency, suicidal or homicidal thoughts you must seek medical attention immediately by calling 911 or calling your MD immediately  if symptoms less severe.  You Must read complete instructions/literature along with all the possible adverse reactions/side effects for all the Medicines you take and that have been prescribed to you. Take any new Medicines after you have completely understood and accpet all the possible adverse reactions/side effects.   Please note  You were cared for by a hospitalist during your hospital stay. If you have any questions about your discharge medications or the care you received while you were in the hospital after you are discharged, you can call the unit and asked to speak with the hospitalist on call if the hospitalist that took care of you is not available. Once you are discharged, your primary care physician will handle any further medical issues. Please note that NO REFILLS for any discharge medications will be authorized once you are discharged, as it is imperative that you return to your primary care physician (or establish a relationship with a primary care physician if you do not have one) for your aftercare needs so that they can reassess your need for medications and monitor your lab values.     Today   Shortness of breath improved since admission.  Still has exertional shortness of breath but that's related to her severe COPD. She was advised to do pursed lip breathing when she gets anxious or worsening shortness of breath.  VITAL SIGNS:  Blood pressure (!) 105/51, pulse 71, temperature 97.8 F (36.6 C), temperature source Oral, resp. rate 18, height 5\' 2"  (1.575 m), weight 33.9 kg (74 lb 12.8 oz), SpO2 99 %.  I/O:    Intake/Output Summary (Last 24 hours) at 12/15/2017 1248 Last data filed at 12/15/2017 0900 Gross per 24 hour  Intake 600 ml  Output -  Net 600 ml    PHYSICAL EXAMINATION:   GENERAL:  61 y.o.-year-old patient lying in bed in NAD.  EYES: Pupils equal, round, reactive to light and accommodation. No scleral icterus. Extraocular muscles intact.  HEENT: Head atraumatic, normocephalic. Oropharynx and nasopharynx clear.  NECK:  Supple, no jugular venous distention. No thyroid enlargement, no tenderness.  LUNGS: Good air entry bilaterally, prolonged inspiratory and expiratory phase. No wheezing, No rhonchi. Negative use of accessory  muscles. CARDIOVASCULAR: S1, S2 normal. No murmurs, rubs, or gallops.  ABDOMEN: Soft, nontender, nondistended. Bowel sounds present. No organomegaly or mass.  EXTREMITIES: No cyanosis, clubbing or edema b/l.    NEUROLOGIC: Cranial nerves II through XII are intact. No focal Motor or sensory deficits b/l.   PSYCHIATRIC: The patient is alert and oriented x 3.  SKIN: No obvious rash, lesion, or ulcer.    DATA REVIEW:   CBC Recent Labs  Lab 12/14/17 0556  WBC 12.6*  HGB 12.9  HCT 38.4  PLT 155    Chemistries  Recent Labs  Lab 12/13/17 0738 12/14/17 0556  NA 139 143  K 3.9 4.2  CL 99* 109  CO2 31 30  GLUCOSE 118* 136*  BUN 12 17  CREATININE 0.75 0.48  CALCIUM 9.2 9.1  AST 26  --   ALT 18  --   ALKPHOS 100  --   BILITOT 1.0  --     Cardiac Enzymes Recent Labs  Lab 12/13/17 0738  TROPONINI <0.03      RADIOLOGY:  No results found.    Management plans  discussed with the patient, family and they are in agreement.  CODE STATUS:     Code Status Orders  (From admission, onward)        Start     Ordered   12/13/17 1144  Full code  Continuous     12/13/17 1143    TOTAL TIME TAKING CARE OF THIS PATIENT: 40 minutes.    [place] SirenSAINANI,Finlay Godbee J M.D on 12/15/2017 at 12:48 PM  Between 7am to 6pm - Pager - [phone]  After 6pm go to [email or web address] - Scientist, research (life sciences)password EPAS [email or web address]  [facility]  Office  [phone]  CC: Primary care physician; [name], [name], FNP

## 2017-12-17 ENCOUNTER — Emergency Department: Payer: Self-pay

## 2017-12-17 ENCOUNTER — Other Ambulatory Visit: Payer: Self-pay

## 2017-12-17 ENCOUNTER — Inpatient Hospital Stay
Admission: EM | Admit: 2017-12-17 | Discharge: 2017-12-21 | DRG: 190 | Disposition: A | Discharge status: Home Health | Payer: Self-pay | Attending: Internal Medicine | Admitting: Internal Medicine

## 2017-12-17 DIAGNOSIS — J9620 Acute and chronic respiratory failure, unspecified whether with hypoxia or hypercapnia: Secondary | ICD-10-CM | POA: Diagnosis present

## 2017-12-17 DIAGNOSIS — J441 Chronic obstructive pulmonary disease with (acute) exacerbation: Principal | ICD-10-CM | POA: Diagnosis present

## 2017-12-17 DIAGNOSIS — Z7901 Long term (current) use of anticoagulants: Secondary | ICD-10-CM

## 2017-12-17 DIAGNOSIS — I071 Rheumatic tricuspid insufficiency: Secondary | ICD-10-CM | POA: Diagnosis present

## 2017-12-17 DIAGNOSIS — K219 Gastro-esophageal reflux disease without esophagitis: Secondary | ICD-10-CM | POA: Diagnosis present

## 2017-12-17 DIAGNOSIS — I10 Essential (primary) hypertension: Secondary | ICD-10-CM | POA: Diagnosis present

## 2017-12-17 DIAGNOSIS — I272 Pulmonary hypertension, unspecified: Secondary | ICD-10-CM | POA: Diagnosis present

## 2017-12-17 DIAGNOSIS — F064 Anxiety disorder due to known physiological condition: Secondary | ICD-10-CM | POA: Diagnosis present

## 2017-12-17 DIAGNOSIS — E43 Unspecified severe protein-calorie malnutrition: Secondary | ICD-10-CM | POA: Diagnosis present

## 2017-12-17 DIAGNOSIS — Z885 Allergy status to narcotic agent status: Secondary | ICD-10-CM

## 2017-12-17 DIAGNOSIS — Z681 Body mass index (BMI) 19 or less, adult: Secondary | ICD-10-CM

## 2017-12-17 DIAGNOSIS — Z66 Do not resuscitate: Secondary | ICD-10-CM | POA: Diagnosis not present

## 2017-12-17 DIAGNOSIS — Z515 Encounter for palliative care: Secondary | ICD-10-CM | POA: Diagnosis not present

## 2017-12-17 DIAGNOSIS — F17210 Nicotine dependence, cigarettes, uncomplicated: Secondary | ICD-10-CM | POA: Diagnosis present

## 2017-12-17 DIAGNOSIS — I482 Chronic atrial fibrillation: Secondary | ICD-10-CM | POA: Diagnosis present

## 2017-12-17 DIAGNOSIS — Z7951 Long term (current) use of inhaled steroids: Secondary | ICD-10-CM

## 2017-12-17 DIAGNOSIS — Z9981 Dependence on supplemental oxygen: Secondary | ICD-10-CM

## 2017-12-17 DIAGNOSIS — Z79899 Other long term (current) drug therapy: Secondary | ICD-10-CM

## 2017-12-17 DIAGNOSIS — R531 Weakness: Secondary | ICD-10-CM | POA: Diagnosis present

## 2017-12-17 DIAGNOSIS — R911 Solitary pulmonary nodule: Secondary | ICD-10-CM | POA: Diagnosis present

## 2017-12-17 LAB — CBC WITH DIFFERENTIAL/PLATELET
BASOS PCT: 0 %
Basophils Absolute: 0 10*3/uL (ref 0–0.1)
EOS ABS: 0 10*3/uL (ref 0–0.7)
Eosinophils Relative: 1 %
HCT: 46.5 % (ref 35.0–47.0)
HEMOGLOBIN: 15.3 g/dL (ref 12.0–16.0)
Lymphocytes Relative: 7 %
Lymphs Abs: 0.5 10*3/uL — ABNORMAL LOW (ref 1.0–3.6)
MCH: 30.2 pg (ref 26.0–34.0)
MCHC: 32.9 g/dL (ref 32.0–36.0)
MCV: 91.7 fL (ref 80.0–100.0)
Monocytes Absolute: 0.4 10*3/uL (ref 0.2–0.9)
Monocytes Relative: 6 %
NEUTROS PCT: 86 %
Neutro Abs: 6.2 10*3/uL (ref 1.4–6.5)
PLATELETS: 192 10*3/uL (ref 150–440)
RBC: 5.07 MIL/uL (ref 3.80–5.20)
RDW: 13.9 % (ref 11.5–14.5)
WBC: 7.2 10*3/uL (ref 3.6–11.0)

## 2017-12-17 LAB — COMPREHENSIVE METABOLIC PANEL
ALK PHOS: 117 U/L (ref 38–126)
ALT: 19 U/L (ref 14–54)
AST: 22 U/L (ref 15–41)
Albumin: 4 g/dL (ref 3.5–5.0)
Anion gap: 7 (ref 5–15)
BILIRUBIN TOTAL: 0.8 mg/dL (ref 0.3–1.2)
BUN: 10 mg/dL (ref 6–20)
CALCIUM: 9.3 mg/dL (ref 8.9–10.3)
CO2: 36 mmol/L — ABNORMAL HIGH (ref 22–32)
CREATININE: 0.57 mg/dL (ref 0.44–1.00)
Chloride: 98 mmol/L — ABNORMAL LOW (ref 101–111)
Glucose, Bld: 90 mg/dL (ref 65–99)
Potassium: 3.9 mmol/L (ref 3.5–5.1)
Sodium: 141 mmol/L (ref 135–145)
TOTAL PROTEIN: 7.2 g/dL (ref 6.5–8.1)

## 2017-12-17 MED ORDER — ALBUTEROL SULFATE (2.5 MG/3ML) 0.083% IN NEBU
2.5000 mg | INHALATION_SOLUTION | RESPIRATORY_TRACT | Status: AC
  Administered 2017-12-17: 2.5 mg via RESPIRATORY_TRACT

## 2017-12-17 MED ORDER — ALPRAZOLAM 0.25 MG PO TABS: 0.2500 mg | ORAL_TABLET | Freq: Every evening | ORAL | Status: DC | PRN

## 2017-12-17 MED ORDER — IPRATROPIUM-ALBUTEROL 0.5-2.5 (3) MG/3ML IN SOLN
3.0000 mL | Freq: Four times a day (QID) | RESPIRATORY_TRACT | Status: DC
  Administered 2017-12-17 – 2017-12-21 (×14): 3 mL via RESPIRATORY_TRACT
  Filled 2017-12-17 (×14): qty 3

## 2017-12-17 MED ORDER — ORAL CARE MOUTH RINSE
15.0000 mL | Freq: Two times a day (BID) | OROMUCOSAL | Status: DC
Start: 2017-12-17 — End: 2017-12-21
  Administered 2017-12-17 – 2017-12-20 (×7): 15 mL via OROMUCOSAL

## 2017-12-17 MED ORDER — METHYLPREDNISOLONE SODIUM SUCC 125 MG IJ SOLR
80.0000 mg | Freq: Once | INTRAMUSCULAR | Status: AC
  Administered 2017-12-17: 80 mg via INTRAVENOUS
  Filled 2017-12-17: qty 2

## 2017-12-17 MED ORDER — IPRATROPIUM-ALBUTEROL 0.5-2.5 (3) MG/3ML IN SOLN: 3.0000 mL | Freq: Four times a day (QID) | RESPIRATORY_TRACT | Status: DC | PRN

## 2017-12-17 MED ORDER — IPRATROPIUM-ALBUTEROL 0.5-2.5 (3) MG/3ML IN SOLN: 3.0000 mL | Freq: Once | RESPIRATORY_TRACT | Status: DC

## 2017-12-17 MED ORDER — AMIODARONE HCL 200 MG PO TABS
200.0000 mg | ORAL_TABLET | Freq: Every day | ORAL | Status: DC
Start: 2017-12-17 — End: 2017-12-21
  Administered 2017-12-17 – 2017-12-21 (×5): 200 mg via ORAL
  Filled 2017-12-17 (×5): qty 1

## 2017-12-17 MED ORDER — APIXABAN 5 MG PO TABS
5.0000 mg | ORAL_TABLET | Freq: Two times a day (BID) | ORAL | Status: DC
  Administered 2017-12-17 – 2017-12-21 (×8): 5 mg via ORAL
  Filled 2017-12-17 (×8): qty 1

## 2017-12-17 MED ORDER — ALBUTEROL SULFATE (2.5 MG/3ML) 0.083% IN NEBU: 2.5000 mg | INHALATION_SOLUTION | RESPIRATORY_TRACT | Status: DC | PRN

## 2017-12-17 MED ORDER — MOMETASONE FURO-FORMOTEROL FUM 200-5 MCG/ACT IN AERO
2.0000 | INHALATION_SPRAY | Freq: Two times a day (BID) | RESPIRATORY_TRACT | Status: DC
  Administered 2017-12-17 – 2017-12-18 (×2): 2 via RESPIRATORY_TRACT
  Filled 2017-12-17: qty 8.8

## 2017-12-17 MED ORDER — ALBUTEROL SULFATE (2.5 MG/3ML) 0.083% IN NEBU
INHALATION_SOLUTION | RESPIRATORY_TRACT | Status: AC
  Administered 2017-12-17: 2.5 mg via RESPIRATORY_TRACT
  Filled 2017-12-17: qty 3

## 2017-12-17 MED ORDER — ALBUTEROL SULFATE (2.5 MG/3ML) 0.083% IN NEBU
5.0000 mg | INHALATION_SOLUTION | Freq: Once | RESPIRATORY_TRACT | Status: AC
  Administered 2017-12-17: 5 mg via RESPIRATORY_TRACT
  Filled 2017-12-17: qty 6

## 2017-12-17 MED ORDER — PANTOPRAZOLE SODIUM 40 MG PO TBEC
40.0000 mg | DELAYED_RELEASE_TABLET | Freq: Every day | ORAL | Status: DC
  Administered 2017-12-18 – 2017-12-20 (×2): 40 mg via ORAL
  Filled 2017-12-17 (×3): qty 1

## 2017-12-17 MED ORDER — METHYLPREDNISOLONE SODIUM SUCC 125 MG IJ SOLR
60.0000 mg | Freq: Four times a day (QID) | INTRAMUSCULAR | Status: DC
Start: 2017-12-17 — End: 2017-12-18
  Administered 2017-12-17 – 2017-12-18 (×4): 60 mg via INTRAVENOUS
  Filled 2017-12-17 (×3): qty 2

## 2017-12-17 MED ORDER — TIOTROPIUM BROMIDE MONOHYDRATE 18 MCG IN CAPS
18.0000 ug | ORAL_CAPSULE | Freq: Every day | RESPIRATORY_TRACT | Status: DC
  Administered 2017-12-18 – 2017-12-21 (×4): 18 ug via RESPIRATORY_TRACT
  Filled 2017-12-17: qty 5

## 2017-12-17 MED ORDER — ENSURE ENLIVE PO LIQD
1.0000 | Freq: Three times a day (TID) | ORAL | Status: DC
  Administered 2017-12-17 – 2017-12-20 (×8): 237 mL via ORAL

## 2017-12-17 MED ORDER — DOCUSATE SODIUM 100 MG PO CAPS
100.0000 mg | ORAL_CAPSULE | Freq: Two times a day (BID) | ORAL | Status: DC
  Administered 2017-12-17 – 2017-12-20 (×6): 100 mg via ORAL
  Filled 2017-12-17 (×6): qty 1

## 2017-12-17 NOTE — ED Provider Notes (Signed)
[facility] Emergency Department Provider Note   ____________________________________________   First MD Initiated Contact with Patient 12/17/17 1145     (approximate)  I have reviewed the triage vital signs and the nursing notes.   HISTORY  Chief Complaint Shortness of Breath    HPI Ashley [name] is a 61 y.o. female who was recently in the hospital discharged 2 days ago.  She comes back today complaining of increasing shortness of breath and now coughing up green phlegm.  Usually she coughs up clear phlegm.  She is constantly on 2 L of oxygen.  Laying in bed oxygen saturation goes down to 94% with talking.  Patient is able to talk in 4-5 word sentences.  Past Medical History:  Diagnosis Date  . Anxiety   . COPD (chronic obstructive pulmonary disease) (HCC)   . Hypertension     Patient Active Problem List   Diagnosis Date Noted  . Acute exacerbation of chronic obstructive pulmonary disease (COPD) (HCC) 12/17/2017  . Acute on chronic respiratory failure with hypoxia (HCC) 11/27/2017  . COPD with acute exacerbation (HCC) 07/08/2017  . COPD exacerbation (HCC) 07/08/2017  . Sepsis (HCC) 02/17/2017  . COPD (chronic obstructive pulmonary disease) (HCC) 06/01/2016  . HTN (hypertension) 06/01/2016  . Diarrhea 10/05/2015  . Protein-calorie malnutrition, severe 10/02/2015  . Malnutrition (HCC) 10/02/2015  . Pressure ulcer 10/01/2015  . SVT (supraventricular tachycardia) (HCC) 09/30/2015  . Pneumonia 09/30/2015  . Chest pain 09/22/2015    Past Surgical History:  Procedure Laterality Date  . ABDOMINAL HYSTERECTOMY      Prior to Admission medications   Medication Sig Start Date End Date Taking? Authorizing Provider  albuterol (PROAIR HFA) 108 (90 Base) MCG/ACT inhaler Inhale 2 puffs into the lungs every 6 (six) hours as needed for wheezing or shortness of breath. 06/02/16  Yes Gouru, Deanna ArtisAruna, MD  ALPRAZolam Prudy Feeler(XANAX) 0.25 MG tablet Take 1 tablet (0.25 mg  total) by mouth at bedtime as needed for anxiety. 12/03/17  Yes Ashley BaasKalisetti, Radhika, MD  amiodarone (PACERONE) 200 MG tablet Take 1 tablet (200 mg total) by mouth daily. 12/15/17  Yes Sainani, Rolly PancakeVivek J, MD  apixaban (ELIQUIS) 5 MG TABS tablet Take 1 tablet (5 mg total) by mouth 2 (two) times daily. 12/03/17  Yes Ashley BaasKalisetti, Radhika, MD  budesonide-formoterol (SYMBICORT) 160-4.5 MCG/ACT inhaler Inhale 2 puffs into the lungs 2 (two) times daily. 12/15/17  Yes Sainani, Rolly PancakeVivek J, MD  feeding supplement, ENSURE ENLIVE, (ENSURE ENLIVE) LIQD Take 237 mLs by mouth 3 (three) times daily between meals. 06/02/16  Yes Gouru, Aruna, MD  ipratropium-albuterol (DUONEB) 0.5-2.5 (3) MG/3ML SOLN Take 3 mLs by nebulization every 6 (six) hours as needed. 12/15/17  Yes Sainani, Rolly PancakeVivek J, MD  predniSONE (DELTASONE) 10 MG tablet Label  & dispense according to the schedule below. 5 Pills PO for 2 days then, 4 Pills PO for 2 days, 3 Pills PO for 2 days, 2 Pills PO for 2 days, 1 Pill PO for 2 days then STOP. 12/15/17  Yes Sainani, Rolly PancakeVivek J, MD  tiotropium (SPIRIVA) 18 MCG inhalation capsule Place 18 mcg into inhaler and inhale daily.   Yes [provider]  docusate sodium (COLACE) 100 MG capsule Take 1 capsule (100 mg total) by mouth 2 (two) times daily. Patient not taking: Reported on 12/13/2017 12/03/17   Ashley BaasKalisetti, Radhika, MD    Allergies Codeine  Family History  Problem Relation Age of Onset  . CAD Mother   . CAD Father   .  Cancer - Prostate Brother   . Cancer - Lung Brother     Social History Social History   Tobacco Use  . Smoking status: Current Every Day Smoker    Packs/day: 0.50    Types: Cigarettes  . Smokeless tobacco: Never Used  Substance Use Topics  . Alcohol use: No  . Drug use: No    Review of Systems  Constitutional: No fever/chills but feels cold Eyes: No visual changes. ENT: No sore throat. Cardiovascular: Denies chest pain. Respiratory:  shortness of breath. Gastrointestinal:  No abdominal pain.  No nausea, no vomiting.  No diarrhea.  No constipation. Genitourinary: Negative for dysuria. Musculoskeletal: Negative for back pain. Skin: Negative for rash. Neurological: Negative for headaches, focal weakness   ____________________________________________   PHYSICAL EXAM:  VITAL SIGNS: ED Triage Vitals [12/17/17 1145]  Enc Vitals Group     BP (!) 190/84     Pulse Rate 84     Resp (!) 26     Temp 97.9 F (36.6 C)     Temp Source Oral     SpO2 96 %     Weight 75 lb (34 kg)     Height      Head Circumference      Peak Flow      Pain Score      Pain Loc      Pain Edu?      Excl. in GC?     Constitutional: Alert and oriented.  Cachectic looking at working to breathe Eyes: Conjunctivae are normal.  Head: Atraumatic. Nose: No congestion/rhinnorhea. Mouth/Throat: Mucous membranes are moist.  Oropharynx non-erythematous. Neck: No stridor.  Cardiovascular: Normal rate, regular rhythm. Grossly normal heart sounds.  Good peripheral circulation. Respiratory: Increased respiratory effort.  Using accessory muscles, retractions. Lungs decreased air movement and some crackles in the right base Gastrointestinal: Soft and nontender. No distention. No abdominal bruits. No CVA tenderness. Musculoskeletal: No lower extremity tenderness nor edema.  No joint effusions. Neurologic:  Normal speech and language. No gross focal neurologic deficits are appreciated. No gait instability. Skin:  Skin is warm, dry and intact. No rash noted. Psychiatric: Mood and affect are normal. Speech and behavior are normal.  ____________________________________________   LABS (all labs ordered are listed, but only abnormal results are displayed)  Labs Reviewed  COMPREHENSIVE METABOLIC PANEL - Abnormal; Notable for the following components:      Result Value   Chloride 98 (*)    CO2 36 (*)    All other components within normal limits  CBC WITH DIFFERENTIAL/PLATELET - Abnormal;  Notable for the following components:   Lymphs Abs 0.5 (*)    All other components within normal limits   ____________________________________________  EKG   ____________________________________________  RADIOLOGY  _Chest x-ray read as no acute changes ___________________________________________   PROCEDURES  Procedure(s) performed:  Procedures  Critical Care performed:  ____________________________________________   INITIAL IMPRESSION / ASSESSMENT AND PLAN / ED COURSE  Old records were reviewed including the ones from this last admission Patient becomes very short of breath even trying to get out of bed.  Is unable to ambulate to check her O2 sat that way.  We will have to admit her.  Clinical Course as of Dec 17 1716  Tue Dec 17, 2017  1536 Hemoglobin: 15.3 [PM]    Clinical Course User Index [PM] Arnaldo Natal, MD     ____________________________________________   FINAL CLINICAL IMPRESSION(S) / ED DIAGNOSES  Final diagnoses:  COPD exacerbation (HCC)  ED Discharge Orders    None       Note:  This document was prepared using Dragon voice recognition software and may include unintentional dictation errors.    Arnaldo NatalMalinda, Temitayo Covalt F, MD 12/17/17 [phone]

## 2017-12-17 NOTE — ED Triage Notes (Signed)
Pt presents via EMS c/o SOB. Hx COPD. D/c from hospital on Sunday per EMS report. Wears 2L 02 at home.

## 2017-12-17 NOTE — ED Notes (Signed)
Patient reassessed reports not feeling as if her breathing is any better. MD made aware. See new orders.

## 2017-12-17 NOTE — H&P (Signed)
[facility] - [place] at [facility]   PATIENT NAME: Ashley [name]    MR#:  [id number]  DATE OF BIRTH:  06-27-1956  DATE OF ADMISSION:  12/17/2017  PRIMARY CARE PHYSICIAN: Toy Cookey, FNP   REQUESTING/REFERRING PHYSICIAN: Arnaldo Natal, MD    CHIEF COMPLAINT:  Shortness of breath and cough  HISTORY OF PRESENT ILLNESS:  Ashley [name]  is a 61 y.o. female with a known history of chronic COPD, multiple multiple admissions for COPD exacerbation, essential hypertension, chronic atrial fibrillation, anxiety was just discharged from the hospital with similar complaint on 12/15/2017. Patient was admitted on December 5 and December 21 with same complaints. Patient reports that she is so short of breath and having cough from yesterday. Chest x-rays negative. Chronically lives on 2 L of oxygen. Patient was given Solu-Medrol and bronchodilator therapy and hospitalist team is called to admit the patient. Husband at bedside.  PAST MEDICAL HISTORY:   Past Medical History:  Diagnosis Date  . Anxiety   . COPD (chronic obstructive pulmonary disease) (HCC)   . Hypertension     PAST SURGICAL HISTOIRY:   Past Surgical History:  Procedure Laterality Date  . ABDOMINAL HYSTERECTOMY      SOCIAL HISTORY:   Social History   Tobacco Use  . Smoking status: Current Every Day Smoker    Packs/day: 0.50    Types: Cigarettes  . Smokeless tobacco: Never Used  Substance Use Topics  . Alcohol use: No    FAMILY HISTORY:   Family History  Problem Relation Age of Onset  . CAD Mother   . CAD Father   . Cancer - Prostate Brother   . Cancer - Lung Brother     DRUG ALLERGIES:   Allergies  Allergen Reactions  . Codeine Nausea Only    REVIEW OF SYSTEMS:  CONSTITUTIONAL: No fever, fatigue or weakness.  EYES: No blurred or double vision.  EARS, NOSE, AND THROAT: No tinnitus or ear pain.  RESPIRATORY: No cough, reports worsening of her chronic shortness of breath,  wheezing . Denies hemoptysis.  CARDIOVASCULAR: No chest pain, orthopnea, edema.  GASTROINTESTINAL: No nausea, vomiting, diarrhea or abdominal pain.  GENITOURINARY: No dysuria, hematuria.  ENDOCRINE: No polyuria, nocturia,  HEMATOLOGY: No anemia, easy bruising or bleeding SKIN: No rash or lesion. MUSCULOSKELETAL: No joint pain or arthritis.   NEUROLOGIC: No tingling, numbness, weakness.  PSYCHIATRY: No anxiety or depression.   MEDICATIONS AT HOME:   Prior to Admission medications   Medication Sig Start Date End Date Taking? Authorizing Provider  albuterol (PROAIR HFA) 108 (90 Base) MCG/ACT inhaler Inhale 2 puffs into the lungs every 6 (six) hours as needed for wheezing or shortness of breath. 06/02/16  Yes Shenna Brissette, Deanna Artis, MD  ALPRAZolam Prudy Feeler) 0.25 MG tablet Take 1 tablet (0.25 mg total) by mouth at bedtime as needed for anxiety. 12/03/17  Yes Enid Baas, MD  amiodarone (PACERONE) 200 MG tablet Take 1 tablet (200 mg total) by mouth daily. 12/15/17  Yes Sainani, Rolly Pancake, MD  apixaban (ELIQUIS) 5 MG TABS tablet Take 1 tablet (5 mg total) by mouth 2 (two) times daily. 12/03/17  Yes Enid Baas, MD  budesonide-formoterol (SYMBICORT) 160-4.5 MCG/ACT inhaler Inhale 2 puffs into the lungs 2 (two) times daily. 12/15/17  Yes Sainani, Rolly Pancake, MD  feeding supplement, ENSURE ENLIVE, (ENSURE ENLIVE) LIQD Take 237 mLs by mouth 3 (three) times daily between meals. 06/02/16  Yes Merl Guardino, MD  ipratropium-albuterol (DUONEB) 0.5-2.5 (3) MG/3ML SOLN Take 3 mLs  by nebulization every 6 (six) hours as needed. 12/15/17  Yes Sainani, Rolly PancakeVivek J, MD  predniSONE (DELTASONE) 10 MG tablet Label  & dispense according to the schedule below. 5 Pills PO for 2 days then, 4 Pills PO for 2 days, 3 Pills PO for 2 days, 2 Pills PO for 2 days, 1 Pill PO for 2 days then STOP. 12/15/17  Yes Sainani, Rolly PancakeVivek J, MD  tiotropium (SPIRIVA) 18 MCG inhalation capsule Place 18 mcg into inhaler and inhale daily.   Yes [provider]  docusate sodium (COLACE) 100 MG capsule Take 1 capsule (100 mg total) by mouth 2 (two) times daily. Patient not taking: Reported on 12/13/2017 12/03/17   Enid BaasKalisetti, Radhika, MD      VITAL SIGNS:  Blood pressure (!) 169/77, pulse 91, temperature 98.2 F (36.8 C), temperature source Oral, resp. rate 20, height 5\' 2"  (1.575 m), weight 32 kg (70 lb 8 oz), SpO2 91 %.  PHYSICAL EXAMINATION:  GENERAL:  61 y.o.-year-old patient lying in the bed with no acute distress. Thin looking , cachectic EYES: Pupils equal, round, reactive to light and accommodation. No scleral icterus. Extraocular muscles intact.  HEENT: Head atraumatic, normocephalic. Oropharynx and nasopharynx clear.  NECK:  Supple, no jugular venous distention. No thyroid enlargement, no tenderness.  LUNGS: Moderate breath sounds bilaterally, minimal diffuse wheezing, no rales,rhonchi or crepitation. No use of accessory muscles of respiration.  CARDIOVASCULAR: S1, S2 normal. No murmurs, rubs, or gallops.  ABDOMEN: Soft, nontender, nondistended. Bowel sounds present. No organomegaly or mass.  EXTREMITIES: No pedal edema, cyanosis, or clubbing.  NEUROLOGIC: Cranial nerves II through XII are intact. Muscle strength 5/5 in all extremities. Sensation intact. Gait not checked.  PSYCHIATRIC: The patient is alert and oriented x 3.  SKIN: No obvious rash, lesion, or ulcer.   LABORATORY PANEL:   CBC Recent Labs  Lab 12/17/17 1217  WBC 7.2  HGB 15.3  HCT 46.5  PLT 192   ------------------------------------------------------------------------------------------------------------------  Chemistries  Recent Labs  Lab 12/17/17 1217  NA 141  K 3.9  CL 98*  CO2 36*  GLUCOSE 90  BUN 10  CREATININE 0.57  CALCIUM 9.3  AST 22  ALT 19  ALKPHOS 117  BILITOT 0.8   ------------------------------------------------------------------------------------------------------------------  Cardiac Enzymes Recent Labs  Lab  12/13/17 0738  TROPONINI <0.03   ------------------------------------------------------------------------------------------------------------------  RADIOLOGY:  Dg Chest 2 View  Result Date: 12/17/2017 CLINICAL DATA:  Shortness of breath.  COPD. EXAM: CHEST  2 VIEW COMPARISON:  12/13/2017 and 02/17/2017 FINDINGS: Stable hyperinflation and emphysematous disease. Chronic architecture distortion and scarring in the left upper lung and left apex region. No new airspace disease. Heart and mediastinum are within normal limits and stable. No large pleural effusions. No acute bone abnormality. IMPRESSION: Emphysema with chronic changes in left upper lung. No acute findings. Electronically Signed   By: Richarda OverlieAdam  Henn M.D.   On: 12/17/2017 12:06    EKG:   Orders placed or performed during the hospital encounter of 12/17/17  . ED EKG  . ED EKG  . ED EKG  . ED EKG    IMPRESSION AND PLAN:   Ashley [name]  is a 61 y.o. female with a known history of chronic COPD, multiple multiple admissions for COPD exacerbation, essential hypertension, chronic atrial fibrillation, anxiety was just discharged from the hospital with similar complaint on 12/15/2017. Patient was admitted on December 5 and December 21 with same complaints. Patient reports that she is so short of breath and having cough from  yesterday. Chest x-rays negative. Chronically lives on 2 L  # Acute on chronic respiratory failure- secondary to COPD exacerbation Admit patient to MedSurg unit IV steroids and bronchodilator therapy Patient should stop smoking Meets COPD Gold criteria, consult pulmonology, case manager and social worker Implement COPD Gold protocol  # .Chronic atrial fibrillation Continue amiodarone and eliquis  #Anxiety continue her home medications Xanax  #Essential hypertension Currently patient is not on any antihypertensives  #Severe malnutrition Continue ensure feeding supplements   #Tobacco abuse  disorder Counseled patient to quit smoking for 5 minutes. She is agreeable with the nicotine patch   GI and DVT prophylaxis    All the records are reviewed and case discussed with ED provider. Management plans discussed with the patient, family and they are in agreement.  CODE STATUS: fc, husband is healthcare power of attorney  TOTAL TIME TAKING CARE OF THIS PATIENT: 43minutes.   Note: This dictation was prepared with Dragon dictation along with smaller phrase technology. Any transcriptional errors that result from this process are unintentional.  Ramonita LabGouru, Rykar Lebleu M.D on 12/17/2017 at 7:29 PM  Between 7am to 6pm - Pager - [phone]  After 6pm go to [email or web address] - password [email or web address] [facility]  [facility]  Office  [phone]  CC: Primary care physician; [name], [name], FNP

## 2017-12-17 NOTE — Progress Notes (Signed)
Patient refused bed alarm. Educated and demonstrates understanding

## 2017-12-17 NOTE — ED Notes (Signed)
Assisted to ambulated pt in room. Pt became very weak and "out of breath," pt returned safely back to bed. Oxygen saturation maintained between 89-90% upon ambulation.

## 2017-12-18 LAB — INFLUENZA PANEL BY PCR (TYPE A & B)
INFLAPCR: NEGATIVE
Influenza B By PCR: NEGATIVE

## 2017-12-18 LAB — PROCALCITONIN

## 2017-12-18 MED ORDER — METHYLPREDNISOLONE SODIUM SUCC 40 MG IJ SOLR
40.0000 mg | Freq: Every day | INTRAMUSCULAR | Status: DC
  Administered 2017-12-19 – 2017-12-21 (×3): 40 mg via INTRAVENOUS
  Filled 2017-12-18 (×3): qty 1

## 2017-12-18 MED ORDER — MOMETASONE FURO-FORMOTEROL FUM 200-5 MCG/ACT IN AERO
2.0000 | INHALATION_SPRAY | Freq: Two times a day (BID) | RESPIRATORY_TRACT | Status: DC
  Administered 2017-12-18 – 2017-12-21 (×6): 2 via RESPIRATORY_TRACT
  Filled 2017-12-18: qty 8.8

## 2017-12-18 MED ORDER — LORAZEPAM 2 MG/ML IJ SOLN
0.5000 mg | Freq: Once | INTRAMUSCULAR | Status: AC
  Administered 2017-12-18: 0.5 mg via INTRAVENOUS
  Filled 2017-12-18: qty 1

## 2017-12-18 MED ORDER — BUDESONIDE 0.5 MG/2ML IN SUSP
0.5000 mg | Freq: Two times a day (BID) | RESPIRATORY_TRACT | Status: DC
  Administered 2017-12-18: 0.5 mg via RESPIRATORY_TRACT
  Filled 2017-12-18: qty 2

## 2017-12-18 MED ORDER — ADULT MULTIVITAMIN W/MINERALS CH
1.0000 | ORAL_TABLET | Freq: Every day | ORAL | Status: DC
  Administered 2017-12-19 – 2017-12-20 (×2): 1 via ORAL
  Filled 2017-12-18 (×2): qty 1

## 2017-12-18 MED ORDER — ALPRAZOLAM 0.25 MG PO TABS
0.2500 mg | ORAL_TABLET | Freq: Four times a day (QID) | ORAL | Status: DC | PRN
  Administered 2017-12-18 – 2017-12-19 (×3): 0.25 mg via ORAL
  Filled 2017-12-18 (×3): qty 1

## 2017-12-18 MED ORDER — MONTELUKAST SODIUM 10 MG PO TABS
10.0000 mg | ORAL_TABLET | Freq: Every day | ORAL | Status: DC
  Administered 2017-12-18 – 2017-12-20 (×3): 10 mg via ORAL
  Filled 2017-12-18 (×3): qty 1

## 2017-12-18 NOTE — Care Management Note (Signed)
Case Management Note  Patient Details  Name: Ashley BaasSusan E [name] MRN: [id number] Date of Birth: 1956-07-09  Subjective/Objective:    Admitted to [facility] with the diagnosis of acute exacerbation of COPD. Discharged from this facility 12/15/17. Lives with husband, [name] [phone]). Dr. Burnett ShengHedrick is listed as primary care physician.    [facility]. [facility].  No home health. No skilled facility. [facility] provides home oxygen. Usually 2 liters per nasal cannula continuous. No insurance. Doesn't qualify for charity Home Health services.  Nebulizer, raised toilet seat, shower chair, and rollator in the home.   Husband is at the bedside. Respiratory treatment in progress           Action/Plan: Physical therapy evaluation completed. Recommending home with home health and supervision. [facility] about [facility] given to husband. Husband requested personal care services. Information given   Expected Discharge Date:  12/19/17               Expected Discharge Plan:     In-House Referral:     Discharge planning Services     Post Acute Care Choice:    Choice offered to:     DME Arranged:    DME Agency:     HH Arranged:    HH Agency:     Status of Service:     If discussed at [employer] Meetings, dates discussed:    Additional Comments:  Gwenette GreetBrenda S Braxtyn Dorff, RN MSN CCM Care Management [phone] 12/18/2017, 1:56 PM

## 2017-12-18 NOTE — Evaluation (Signed)
Physical Therapy Evaluation Patient Details Name: Ashley BaasSusan E [name] MRN: [id number] DOB: 05/25/1956 Today's Date: 12/18/2017   History of Present Illness  Pt is a 61 y/o F who presented with SOB and cough.  Pt with multiple recent admissions for COPD exacerbation and pt continues to smoke, agreeable to nicotine patch this admission. Chest x-ray negative.  Pt chronically on 2L O2.     Clinical Impression  Pt admitted with above diagnosis. Pt currently with functional limitations due to the deficits listed below (see PT Problem List). Mrs. Ashley [name] was agitated saying, "Let's just get this done!" but agreeable to work with therapy.  At baseline she ambulates limited household distances with rollator and requires assist for sponge bathing and IADLs. Her husband is gone during the day at work and the pt does not have any additional support. Educated pt in pursed lip breathing sitting EOB and while ambulating.  Despite max verbal cues and demonstration the pt is unable to perform consistently, question effort put forth by pt.  SpO2 remaining at or above 90% on 2L O2 while ambulating; however, once pt seated EOB following ambulation SpO2 down to 86% on 2L O2 and 87% on 2L O2 at end of session, RN notified.  Pt demonstrates mild instability but no LOB.  CM planning to [facility] with the pt as pt is without insurance. Pt will benefit from skilled PT to increase their independence and safety with mobility to allow discharge to the venue listed below.      Follow Up Recommendations Home health PT;Supervision/Assistance - 24 hour(Pt without insurance, CM will provide [facility] info)    Equipment Recommendations  None recommended by PT    Recommendations for Other Services OT consult(Energy conservation techniques and ADLs)     Precautions / Restrictions Precautions Precautions: Fall;Other (comment) Precaution Comments: O2 Restrictions Weight Bearing Restrictions: No       Mobility  Bed Mobility Overal bed mobility: Independent             General bed mobility comments: No physical assist or cues needed.  Pt performs independently.   Transfers Overall transfer level: Needs assistance Equipment used: None Transfers: Sit to/from Stand Sit to Stand: Supervision         General transfer comment: Supervision for safety as pt demonstrates mild instability but no LOB.    Ambulation/Gait Ambulation/Gait assistance: Min guard Ambulation Distance (Feet): 60 Feet Assistive device: None Gait Pattern/deviations: Step-through pattern;Decreased stride length;Trunk flexed Gait velocity: decreased Gait velocity interpretation: Below normal speed for age/gender General Gait Details: Cues for pursed lip breathing with SpO2 remaining at or above 90% on 2L O2 while ambulating; however, once pt seated EOB following ambulation SpO2 down to 86% on 2L O2 and 87% on 2L O2 at end of session, RN notified.  Pt demonstrates mild instability but no LOB.  She reports dizziness after ambualting ~30 ft and reports that this occurs regularly at home when she gets up to ambulate, RN notified.  Dizziness resolved quickly after sitting EOB.   Stairs            Wheelchair Mobility    Modified Rankin (Stroke Patients Only)       Balance Overall balance assessment: Needs assistance Sitting-balance support: No upper extremity supported;Feet supported Sitting balance-Leahy Scale: Fair     Standing balance support: No upper extremity supported;During functional activity Standing balance-Leahy Scale: Fair  Pertinent Vitals/Pain Pain Assessment: No/denies pain    Home Living Family/patient expects to be discharged to:: Private residence Living Arrangements: Spouse/significant other Available Help at Discharge: Family;Available PRN/intermittently(Husband works during day, no other family/friend help) Type of Home:  House Home Access: Stairs to enter Entrance Stairs-Rails: None Entrance Stairs-Number of Steps: 1 Home Layout: One level Home Equipment: ("I have it all", "Let's just get this over with!") Additional Comments: Limited information provided as pt agitated and saying, "Let's just get this over with!" when asking information about home layout and equipment.     Prior Function Level of Independence: Needs assistance   Gait / Transfers Assistance Needed: Pt ambulates limited household distances with rollator.   ADL's / Homemaking Assistance Needed: Pt requires assist for sponge bathing.  Husband doing the cooking, cleaning, driving.         Hand Dominance        Extremity/Trunk Assessment   Upper Extremity Assessment Upper Extremity Assessment: Defer to OT evaluation    Lower Extremity Assessment Lower Extremity Assessment: Generalized weakness    Cervical / Trunk Assessment Cervical / Trunk Assessment: Kyphotic  Communication   Communication: No difficulties  Cognition Arousal/Alertness: Awake/alert Behavior During Therapy: Agitated Overall Cognitive Status: Within Functional Limits for tasks assessed                                        General Comments      Exercises Other Exercises Other Exercises: Pursed lip breathing sitting EOB and while ambulating.  Despite max verbal cues and demonstration the pt is unable to perform consistently, question effort put forth by pt.    Assessment/Plan    PT Assessment Patient needs continued PT services  PT Problem List Decreased strength;Decreased activity tolerance;Decreased balance;Decreased knowledge of use of DME;Decreased safety awareness;Cardiopulmonary status limiting activity       PT Treatment Interventions DME instruction;Gait training;Stair training;Functional mobility training;Therapeutic activities;Therapeutic exercise;Balance training;Neuromuscular re-education;Patient/family education    PT  Goals (Current goals can be found in the Care Plan section)  Acute Rehab PT Goals Patient Stated Goal: to feel better PT Goal Formulation: With patient Time For Goal Achievement: 01/01/18 Potential to Achieve Goals: Fair    Frequency Min 2X/week   Barriers to discharge Inaccessible home environment;Decreased caregiver support Step to enter home, no assist during the day when husband away at work    Co-evaluation               AM-PAC PT "6 Clicks" Daily Activity  Outcome Measure Difficulty turning over in bed (including adjusting bedclothes, sheets and blankets)?: None Difficulty moving from lying on back to sitting on the side of the bed? : None Difficulty sitting down on and standing up from a chair with arms (e.g., wheelchair, bedside commode, etc,.)?: A Little Help needed moving to and from a bed to chair (including a wheelchair)?: A Little Help needed walking in hospital room?: A Little Help needed climbing 3-5 steps with a railing? : A Little 6 Click Score: 20    End of Session Equipment Utilized During Treatment: Gait belt;Oxygen Activity Tolerance: Treatment limited secondary to medical complications (Comment);Patient limited by fatigue(hypoxia) Patient left: in bed;with call bell/phone within reach;with bed alarm set;with family/visitor present Nurse Communication: Mobility status;Other (comment)(SpO2) PT Visit Diagnosis: Unsteadiness on feet (R26.81);Other abnormalities of gait and mobility (R26.89);Muscle weakness (generalized) (M62.81)    Time: 1191-4782 PT Time Calculation (min) (  ACUTE ONLY): 12 min   Charges:   PT Evaluation $PT Eval Low Complexity: 1 Low     PT G Codes:   PT G-Codes **NOT FOR INPATIENT CLASS** Functional Assessment Tool Used: AM-PAC 6 Clicks Basic Mobility;Clinical judgement Functional Limitation: Mobility: Walking and moving around Mobility: Walking and Moving Around Current Status ([id number]([id number]): At least 20 percent but less than 40 percent  impaired, limited or restricted Mobility: Walking and Moving Around Goal Status [phone](G8979): At least 1 percent but less than 20 percent impaired, limited or restricted    Encarnacion ChuAshley Danniela Mcbrearty PT, DPT 12/18/2017, 10:55 AM

## 2017-12-18 NOTE — Evaluation (Signed)
Occupational Therapy Evaluation Patient Details Name: Ashley [name] MRN: [id number] DOB: [birth year] Today's Date: 12/18/2017    History of Present Illness Pt is a 61 y/o F who presented with SOB and cough.  Pt with multiple recent admissions for COPD exacerbation and pt continues to smoke, agreeable to nicotine patch this admission. Chest x-ray negative.  Pt chronically on 2L O2.    Clinical Impression   Patient was seen for OT evaluation this date.  Patient lives at home with her husband in a one story home with one step to enter.  She has chronic COPD however reports recent decline in function and has been hospitalized 3 times in the last month.  Her husband works during the day and they report they do not have insurance and no other family or friends to help at home during the day.  Patient presents with muscle weakness, decreased transfers, functional mobility and decreased ability to perform self care tasks.  Her oxygen levels drop when she engages in basic tasks. She would benefit from skilled OT to maximize her safety and independence in daily tasks.  She likely needs 24 hour supervision and home health OT if possible.      Follow Up Recommendations  Home health OT    Equipment Recommendations       Recommendations for Other Services       Precautions / Restrictions Precautions Precautions: Fall Precaution Comments: O2 Restrictions Weight Bearing Restrictions: No Other Position/Activity Restrictions: Patient reports she passes out when getting up to go to the bathroom.       Mobility Bed Mobility Overal bed mobility: Independent             General bed mobility comments: No physical assist or cues needed.  Pt performs independently.   Transfers Overall transfer level: Needs assistance Equipment used: None Transfers: Sit to/from Stand Sit to Stand: Supervision         General transfer comment: Supervision for safety as pt demonstrates mild instability but no  LOB.      Balance Overall balance assessment: Needs assistance Sitting-balance support: No upper extremity supported;Feet supported Sitting balance-Leahy Scale: Fair     Standing balance support: No upper extremity supported;During functional activity Standing balance-Leahy Scale: Fair                             ADL either performed or assessed with clinical judgement   ADL Overall ADL's : Needs assistance/impaired Eating/Feeding: Modified independent   Grooming: Sitting;Set up   Upper Body Bathing: Minimal assistance   Lower Body Bathing: Moderate assistance   Upper Body Dressing : Modified independent   Lower Body Dressing: Minimal assistance Lower Body Dressing Details (indicate cue type and reason): decreased O2 sats when participating in dressing tasks. Toilet Transfer: Supervision/safety           Functional mobility during ADLs: Supervision/safety General ADL Comments: [name] O2 sats tend to fall into the 80s when participating in self care tasks.  She reports she has passed out at home when trying to get up to the commode and her husband works during the day.  She would benefit from home health, potentially an aide to assist during the day however she reports no insurance and is unsure what she would qualify for.      Vision Baseline Vision/History: Wears glasses Wears Glasses: At all times       Perception     Praxis  Pertinent Vitals/Pain Pain Assessment: No/denies pain     Hand Dominance Right   Extremity/Trunk Assessment Upper Extremity Assessment Upper Extremity Assessment: Generalized weakness   Lower Extremity Assessment Lower Extremity Assessment: Defer to PT evaluation   Cervical / Trunk Assessment Cervical / Trunk Assessment: Kyphotic   Communication Communication Communication: No difficulties   Cognition Arousal/Alertness: Awake/alert Behavior During Therapy: Agitated Overall Cognitive Status: Within Functional  Limits for tasks assessed                                 General Comments: Patient emotionally labile at times regarding her situation.   General Comments       Exercises Exercises: Other exercises Other Exercises Other Exercises: Pursed lip breathing sitting EOB and while ambulating.  Despite max verbal cues and demonstration the pt is unable to perform consistently, question effort put forth by pt.    Shoulder Instructions      Home Living Family/patient expects to be discharged to:: Private residence Living Arrangements: Spouse/significant other(spouse works during the day) Available Help at Discharge: Family;Available PRN/intermittently(when spouse is at work during the day, she has no other help) Type of Home: House Home Access: Stairs to enter Entergy CorporationEntrance Stairs-Number of Steps: 1 Entrance Stairs-Rails: None Home Layout: One level     Bathroom Shower/Tub: (performs sponge baths only)   Bathroom Toilet: Handicapped height     Home Equipment: Environmental consultantWalker - 4 wheels;Wheelchair - manual;Bedside commode   Additional Comments: Patient emotionally labile and agitated and states, "No one cares, they just want to get me out of here.  My husband works during the day and its been a bad month for me, this is my third time here."      Prior Functioning/Environment Level of Independence: Needs assistance  Gait / Transfers Assistance Needed: uses rollator for functional mobility but reports recent decline in the past 2 weeks. ADL's / Homemaking Assistance Needed: Assistance with sponge bath and homemaking skills such as cooking, cleaning, driving.  She is independent with her medication management and was able to transfer to the commode however reports recent decline and has passed out when getting up at home.Husband works during the day and they report no other family to assist.    Comments: Husband would like to have information on assistance at home or the cost of a facility.   They currently do not have insurance and he is aware he will likely have to pay out of pocket for home services unless they can qualify for assistance.         OT Problem List: Decreased strength;Cardiopulmonary status limiting activity;Decreased activity tolerance;Decreased knowledge of use of DME or AE      OT Treatment/Interventions: Self-care/ADL training;DME and/or AE instruction;Therapeutic activities;Therapeutic exercise;Energy conservation;Patient/family education    OT Goals(Current goals can be found in the care plan section) Acute Rehab OT Goals Patient Stated Goal: to be able to take care of myself OT Goal Formulation: With patient Time For Goal Achievement: 12/28/17 Potential to Achieve Goals: Fair ADL Goals Pt Will Perform Lower Body Dressing: (P) with adaptive equipment;with modified independence Pt Will Transfer to Toilet: (P) with modified independence  OT Frequency: Min 1X/week   Barriers to D/C:            Co-evaluation              AM-PAC PT "6 Clicks" Daily Activity     Outcome Measure Help from another  person eating meals?: None Help from another person taking care of personal grooming?: A Little Help from another person toileting, which includes using toliet, bedpan, or urinal?: A Little Help from another person bathing (including washing, rinsing, drying)?: A Little Help from another person to put on and taking off regular upper body clothing?: A Little Help from another person to put on and taking off regular lower body clothing?: A Little 6 Click Score: 19   End of Session    Activity Tolerance: Treatment limited secondary to agitation Patient left: in bed;with call bell/phone within reach;with family/visitor present  OT Visit Diagnosis: Muscle weakness (generalized) (M62.81);Other (comment)(energy conservation techniques)                Time: [id number] OT Time Calculation (min): 20 min Charges:  OT General Charges $OT Visit: 1 Visit OT  Evaluation $OT Eval Low Complexity: 1 Low G-Codes: OT G-codes **NOT FOR INPATIENT CLASS** Functional Assessment Tool Used: AM-PAC 6 Clicks Daily Activity Functional Limitation: Self care Self Care Current Status (V4098(G8987): At least 40 percent but less than 60 percent impaired, limited or restricted Self Care Goal Status (J1914(G8988): At least 20 percent but less than 40 percent impaired, limited or restricted   Avontae Burkhead T Jahaad Penado, OTR/L, CLT   Rhet Rorke 12/18/2017, 12:10 PM

## 2017-12-18 NOTE — Progress Notes (Signed)
Initial Nutrition Assessment  DOCUMENTATION CODES:   Severe malnutrition in context of chronic illness, Underweight  INTERVENTION:  Recommend liberalizing to a regular diet.  Continue Ensure Enlive po TID, each supplement provides 350 kcal and 20 grams of protein.  Provide MVI daily.  NUTRITION DIAGNOSIS:   Severe Malnutrition related to chronic illness(COPD) as evidenced by severe fat depletion, severe muscle depletion.  GOAL:   Patient will meet greater than or equal to 90% of their needs  MONITOR:   PO intake, Supplement acceptance, Labs, Weight trends, I & O's  REASON FOR ASSESSMENT:   Malnutrition Screening Tool, Consult Assessment of nutrition requirement/status  ASSESSMENT:   61 year old female with PMHx of COPD, HTN, anxiety now admitted with acute exacerbation of COPD.   Noted patient has had 2 recent admissions for acute exacerbation of COPD.  Met with patient and her husband at bedside. Patient was sitting on the bed and was very short of breath, so she was unable to provide any history. Husband reports patient has had a poor appetite for a while now. She eats 3 very small meals per day, but is unable to provide any details on intake. She drinks 1-2 bottles of Ensure daily.  He reports her weight has been stable for a while now. Her UBW had only been 100 lbs.  Medications reviewed and include: Colace, Solu-Medrol 40 mg daily IV, pantoprazole.  Labs reviewed: Chloride 98, CO2 36.  NUTRITION - FOCUSED PHYSICAL EXAM:    Most Recent Value  Orbital Region  Severe depletion  Upper Arm Region  Severe depletion  Thoracic and Lumbar Region  Severe depletion  Buccal Region  Severe depletion  Temple Region  Severe depletion  Clavicle Bone Region  Severe depletion  Clavicle and Acromion Bone Region  Severe depletion  Scapular Bone Region  Severe depletion  Dorsal Hand  Severe depletion  Patellar Region  Severe depletion  Anterior Thigh Region  Severe depletion   Posterior Calf Region  Severe depletion  Edema (RD Assessment)  None  Hair  Reviewed  Eyes  Reviewed  Mouth  Reviewed  Skin  Reviewed  Nails  Reviewed     Diet Order:  Diet Heart Room service appropriate? Yes; Fluid consistency: Thin  EDUCATION NEEDS:   Not appropriate for education at this time  Skin:  Skin Assessment: Reviewed RN Assessment  Last BM:  PTA (12/16/2017 per chart)  Height:   Ht Readings from Last 1 Encounters:  12/17/17 5' 2"  (1.575 m)    Weight:   Wt Readings from Last 1 Encounters:  12/17/17 70 lb 8 oz (32 kg)    Ideal Body Weight:  50 kg  BMI:  Body mass index is 12.89 kg/m.  Estimated Nutritional Needs:   Kcal:  1200-1400  Protein:  50-60 grams (1.5-1.8 grams/kg)  Fluid:  1.2-1.4 L/day  Willey Blade, MS, RD, LDN Office: [phone] Pager: [phone] After Hours/Weekend Pager: [phone]

## 2017-12-18 NOTE — Clinical Social Work Note (Signed)
Clinical Social Work Assessment  Patient Details  Name: Ashley [name] MRN: [id number] Date of Birth: July 02, 1956  Date of referral:  12/18/17               Reason for consult:  Other (Comment Required)(COPD Gold Consult. )                Permission sought to share information with:    Permission granted to share information::     Name::        Agency::     Relationship::     Contact Information:     Housing/Transportation Living arrangements for the past 2 months:  Single Family Home Source of Information:  Patient Patient Interpreter Needed:  None Criminal Activity/Legal Involvement Pertinent to Current Situation/Hospitalization:  No - Comment as needed Significant Relationships:  Siblings, Spouse Lives with:  Spouse Do you feel safe going back to the place where you live?  Yes Need for family participation in patient care:  Yes (Comment)  Care giving concerns:  Patient lives in [place] with her husband Ashley [name].    Social Worker assessment / plan:  Holiday representative (CSW) received COPD Gold consult. PT is recommending home health. CSW met with patient briefly alone at bedside. Patient was alert and oriented X4 and was sitting up in the bed. Patient denied anxiety and depression symptoms and reported no needs. Please reconsult if future social work needs arise. CSW signing off.   Employment status:  Disabled (Comment on whether or not currently receiving Disability), Retired Forensic scientist:  Self Pay (Medicaid Pending) PT Recommendations:  Home with [place] / Referral to community resources:  Outpatient Psychiatric Care (Comment Required)  Patient/Family's Response to care:  Patient reported no needs.   Patient/Family's Understanding of and Emotional Response to Diagnosis, Current Treatment, and Prognosis:  Patient was pleasant but guarded with her answers.   Emotional Assessment Appearance:  Appears stated age Attitude/Demeanor/Rapport:    Affect  (typically observed):  Calm, Guarded Orientation:  Oriented to Self, Oriented to Place, Oriented to  Time, Oriented to Situation Alcohol / Substance use:  Not Applicable Psych involvement (Current and /or in the community):  No (Comment)  Discharge Needs  Concerns to be addressed:  No discharge needs identified Readmission within the last 30 days:  No Current discharge risk:  None Barriers to Discharge:  No Barriers Identified   Ashley [name], Ashley Beets, LCSW 12/18/2017, 10:34 PM

## 2017-12-18 NOTE — Progress Notes (Addendum)
Date: 12/18/2017,   MRN# [id number] Enid BaasSusan E Frieze 17-Jun-1956 Code Status:     Code Status Orders  (From admission, onward)        Start     Ordered   12/17/17 1746  Full code  Continuous     12/17/17 1745    Code Status History    Date Active Date Inactive Code Status Order ID Comments User Context   12/13/2017 11:43 12/15/2017 15:55 Full Code [id number]  Ramonita LabGouru, Aruna, MD ED   11/27/2017 06:19 12/03/2017 16:25 Full Code [id number]  Arnaldo Nataliamond, Michael S, MD Inpatient   07/08/2017 18:25 07/10/2017 12:49 Full Code [id number]  Altamese DillingVachhani, Vaibhavkumar, MD Inpatient   02/17/2017 14:25 02/19/2017 16:29 Full Code [id number]  Enid BaasKalisetti, Radhika, MD Inpatient   06/01/2016 02:00 06/02/2016 18:06 Full Code [id number]  Gery Prayrosley, Debby, MD Inpatient   09/30/2015 16:04 10/05/2015 15:50 Full Code [id number]  Altamese DillingVachhani, Vaibhavkumar, MD ED   09/22/2015 17:47 09/24/2015 14:04 Full Code [id number]  Enid BaasKalisetti, Radhika, MD Inpatient     Hosp day:@LENGTHOFSTAYDAYS @ Referring MD: @ATDPROV @       CC: Multiple copd admissions  HPI: This is a 61 yr old lady, hx of cigarette smoking, who comes back in with copd/asthma flare.  Of note she also has significant anxiety. The later is being addressed.. Less so today.  [facility] like sxs. There is no chest pain, edema, calf pain, hemoptysis or palpitation. On amiodarone for AFIB as well she is in eliquis. No symptomatic gerd, pnd, or exposure to noxious inhalants. She is on oxygen. Chest ct noted below. .    Study Conclusions  - Left ventricle: The cavity size was mildly dilated. Systolic   function was normal. The estimated ejection fraction was 50%.   Diffuse hypokinesis. - Mitral valve: There was mild regurgitation. - Right ventricle: The cavity size was mildly dilated. - Right atrium: The atrium was mildly dilated. - Tricuspid valve: There was moderate regurgitation. - Pulmonary arteries: PA peak pressure: 41 mm Hg (S). - Pericardium,  extracardiac: A trivial pericardial effusion was   identified posterior to the heart.  Impressions:  - The right ventricular systolic pressure was increased consistent   with mild pulmonary hypertension. Bordeline LVEF and no thrombii   seen in LV/LA. Scan on 12/04/2017 12:34 PM by Default, Provider, MDScan on 12/04/2017 12:34 PM by     PMHX:   Past Medical History:  Diagnosis Date  . Anxiety   . COPD (chronic obstructive pulmonary disease) (HCC)   . Hypertension    Surgical Hx:  Past Surgical History:  Procedure Laterality Date  . ABDOMINAL HYSTERECTOMY     Family Hx:  Family History  Problem Relation Age of Onset  . CAD Mother   . CAD Father   . Cancer - Prostate Brother   . Cancer - Lung Brother    Social Hx:   Social History   Tobacco Use  . Smoking status: Current Every Day Smoker    Packs/day: 0.50    Types: Cigarettes  . Smokeless tobacco: Never Used  Substance Use Topics  . Alcohol use: No  . Drug use: No   Medication:    Home Medication:    Current Medication: @CURMEDTAB @   Allergies:  Codeine  Review of Systems: Gen:  Denies  fever, sweats, chills HEENT: Denies blurred vision, double vision, ear pain, eye pain, hearing loss, nose bleeds, sore throat Cvc:  No dizziness, chest pain or heaviness Resp:   Sob, wheezing, on  oxygen Gi: Denies swallowing difficulty, stomach pain, nausea or vomiting, diarrhea, constipation, bowel incontinence Gu:  Denies bladder incontinence, burning urine Ext:   No Joint pain, stiffness or swelling Skin: No skin rash, easy bruising or bleeding or hives Endoc:  No polyuria, polydipsia , polyphagia or weight change Psych: No depression, insomnia or hallucinations  Other:  All other systems negative  Physical Examination:   VS: BP (!) 111/44   Pulse 93   Temp 98.2 F (36.8 C) (Oral)   Resp 18   Ht 5\' 2"  (1.575 m)   Wt 70 lb 8 oz (32 kg)   SpO2 96%   BMI 12.89 kg/m   General Appearance: mild distress, on  oxygen at 2 liters, small frame.  Neuro: without focal findings, mental status, speech normal, alert and oriented, cranial nerves 2-12 intact, reflexes normal and symmetric, sensation grossly normal  HEENT: PERRLA, EOM intact, no ptosis, no other lesions noticed NECK: Supple, cachetic, no stridor.  Pulmonary:.No wheezing, No rales  Somewhat coarse Cardiovascular:  Irreg, Normal S1,S2.  No m/r/g.  .    Abdomen:Benign, Soft, non-tender, No masses, hepatosplenomegaly, No lymphadenopathy Endoc: No evident thyromegaly, no signs of acromegaly or Cushing features Skin:   warm, no rashes, no ecchymosis  Extremities: normal, no cyanosis, clubbing, no edema, warm with normal capillary refill.    Labs results:   Recent Labs    12/17/17 1217  HGB 15.3  HCT 46.5  MCV 91.7  WBC 7.2  BUN 10  CREATININE 0.57  GLUCOSE 90  CALCIUM 9.3  ,      Rad results:  CLINICAL DATA:  Shortness of breath.  COPD.  EXAM: CHEST  2 VIEW  COMPARISON:  12/13/2017 and 02/17/2017  FINDINGS: Stable hyperinflation and emphysematous disease. Chronic architecture distortion and scarring in the left upper lung and left apex region. No new airspace disease. Heart and mediastinum are within normal limits and stable. No large pleural effusions. No acute bone abnormality.  IMPRESSION: Emphysema with chronic changes in left upper lung. No acute findings.   Electronically Signed   By: Richarda Overlie M.D.   On: 12/17/2017 12:06   IMPRESSION: chest ct 1. No evidence of a pulmonary embolism. 2. Wall thickening of the distal esophagus. Consider esophagitis if there are consistent clinical symptoms, which could be further assessed with endoscopy or a barium swallow. 3. Advanced emphysema with chronic lung scarring mostly in the left upper lobe. No acute findings in the lungs. 4. Left lower lobe nodule, which appears increased in size when compared to the prior CT. Nodule small with a mean size of 4.5  mm. Recommend follow-up CT in 3-6 months to reassess. 5. Aortic atherosclerosis.  Aortic Atherosclerosis (ICD10-I70.0) and Emphysema (ICD10-J43.9).   Electronically Signed   By: Amie Portland M.D.   On: 12/13/2017 09:29    Assessment and Plan: Hx of copd/asthma flare. Improving. Anxiety contributing. ? Silent reflux contribution as well. No chf, no fibrosis, no signs of amiodarone toxicity. Per echo there is elevated right sided presures ( pulm htn type III) Restart her dulera, spiriva, albuterol, oxygen as you doing Consider prolong prednisone ( pred taper, then prednisone 10 mg q day until she see [place] at [facility]  in 2 weeks).  Add singulair 10 mg qhs Following with you  Left lower lobe 4.5 mm nodule, appears to be increasing in size Repeat non contrast chest ct in 3-4 months  Thickening of the distal esophageal wall. Suspect chronic  reflux  Protonix 40 mg  q day  There clearly a strong presence of anxiety Agree with a trial of low dose benzo   I have personally obtained a history, examined the patient, evaluated laboratory and imaging results, formulated the assessment and plan and placed orders.  The Patient requires high complexity decision making for assessment and support, frequent evaluation and titration of therapies, application of advanced monitoring technologies and extensive interpretation of multiple databases.   Herbon Fleming,M.D. Pulmonary  [facility]

## 2017-12-18 NOTE — Progress Notes (Signed)
Patient ID: Ashley BaasSusan E Birchard, female   DOB: [birth year], 61 y.o.   MRN: [id number]  [employer] [facility] PROGRESS NOTE  Ashley BaasSusan E [name] [id number] DOB: [birth year] DOA: 12/17/2017 PCP: [name], [name], FNP  HPI/Subjective: Patient feeling very short of breath.  Was short of breath after nebulizer treatments.  Patient also anxious.  Objective: Vitals:   12/18/17 0724 12/18/17 1437  BP:    Pulse:    Resp:    Temp:    SpO2: 93% 93%    Filed Weights   12/17/17 1145 12/17/17 1732  Weight: 34 kg (75 lb) 32 kg (70 lb 8 oz)    ROS: Review of Systems  Constitutional: Negative for chills and fever.  Eyes: Negative for blurred vision.  Respiratory: Positive for cough, shortness of breath and wheezing.   Cardiovascular: Positive for chest pain.  Gastrointestinal: Negative for abdominal pain, constipation, diarrhea, nausea and vomiting.  Genitourinary: Negative for dysuria.  Musculoskeletal: Negative for joint pain.  Neurological: Negative for dizziness and headaches.   Exam: Physical Exam  Constitutional: She is oriented to person, place, and time.  HENT:  Nose: No mucosal edema.  Mouth/Throat: No oropharyngeal exudate or posterior oropharyngeal edema.  Eyes: Conjunctivae, EOM and lids are normal. Pupils are equal, round, and reactive to light.  Neck: No JVD present. Carotid bruit is not present. No edema present. No thyroid mass and no thyromegaly present.  Cardiovascular: S1 normal and S2 normal. Exam reveals no gallop.  No murmur heard. Pulses:      Dorsalis pedis pulses are 2+ on the right side, and 2+ on the left side.  Respiratory: No respiratory distress. She has decreased breath sounds in the right upper field, the right middle field, the right lower field, the left upper field, the left middle field and the left lower field. She has wheezes in the right lower field and the left lower field. She has no rhonchi. She has no rales.  GI: Soft. Bowel sounds are normal. There is no  tenderness.  Musculoskeletal:       Right ankle: She exhibits no swelling.       Left ankle: She exhibits no swelling.  Lymphadenopathy:    She has no cervical adenopathy.  Neurological: She is alert and oriented to person, place, and time. No cranial nerve deficit.  Skin: Skin is warm. No rash noted. Nails show no clubbing.  Psychiatric: She has a normal mood and affect.      Data Reviewed: Basic Metabolic Panel: Recent Labs  Lab 12/13/17 0738 12/14/17 0556 12/17/17 1217  NA 139 143 141  K 3.9 4.2 3.9  CL 99* 109 98*  CO2 31 30 36*  GLUCOSE 118* 136* 90  BUN 12 17 10   CREATININE 0.75 0.48 0.57  CALCIUM 9.2 9.1 9.3   Liver Function Tests: Recent Labs  Lab 12/13/17 0738 12/17/17 1217  AST 26 22  ALT 18 19  ALKPHOS 100 117  BILITOT 1.0 0.8  PROT 6.9 7.2  ALBUMIN 3.8 4.0   CBC: Recent Labs  Lab 12/13/17 0738 12/14/17 0556 12/17/17 1217  WBC 7.1 12.6* 7.2  NEUTROABS 4.8  --  6.2  HGB 15.5 12.9 15.3  HCT 46.2 38.4 46.5  MCV 91.3 91.5 91.7  PLT 208 155 192   Cardiac Enzymes: Recent Labs  Lab 12/13/17 0738  TROPONINI <0.03   BNP (last 3 results) Recent Labs    11/27/17 0407 12/13/17 0738  BNP 45.0 42.0      Studies: Dg Chest 2  View  Result Date: 12/17/2017 CLINICAL DATA:  Shortness of breath.  COPD. EXAM: CHEST  2 VIEW COMPARISON:  12/13/2017 and 02/17/2017 FINDINGS: Stable hyperinflation and emphysematous disease. Chronic architecture distortion and scarring in the left upper lung and left apex region. No new airspace disease. Heart and mediastinum are within normal limits and stable. No large pleural effusions. No acute bone abnormality. IMPRESSION: Emphysema with chronic changes in left upper lung. No acute findings. Electronically Signed   By: Richarda OverlieAdam  Henn M.D.   On: 12/17/2017 12:06    Scheduled Meds: . amiodarone  200 mg Oral Daily  . apixaban  5 mg Oral BID  . budesonide (PULMICORT) nebulizer solution  0.5 mg Nebulization BID  . docusate  sodium  100 mg Oral BID  . feeding supplement (ENSURE ENLIVE)  1 Bottle Oral TID BM  . ipratropium-albuterol  3 mL Nebulization Q6H  . mouth rinse  15 mL Mouth Rinse BID  . [START ON 12/19/2017] methylPREDNISolone (SOLU-MEDROL) injection  40 mg Intravenous Daily  . [START ON 12/19/2017] multivitamin with minerals  1 tablet Oral Daily  . pantoprazole  40 mg Oral Daily  . tiotropium  18 mcg Inhalation Daily   Continuous Infusions:  Assessment/Plan:  1. Acute on chronic respiratory failure.  Continue oxygen supplementation. 2. COPD exacerbation on Solu-Medrol daily, continue DuoNeb nebulizer solution.  Patient did not do well after budesonide nebulizers switch back to [facility] inhal[facility]. 3. Anxiety.  I needed to give an IV dose of Ativan to have the patient breathe better.  Make Xanax more frequently. 4. Chronic atrial fibrillation on amiodarone and Eliquis. 5. Severe malnutrition on Ensure 6. Patient states that she stopped smoking 1 month ago but as per prior notes may still be smoking. 7. Weakness.  Physical therapy evaluation  Code Status:     Code Status Orders  (From admission, onward)        Start     Ordered   12/17/17 1746  Full code  Continuous     12/17/17 1745    Code Status History    Date Active Date Inactive Code Status Order ID Comments User Context   12/13/2017 11:43 12/15/2017 15:55 Full Code [id number]  Ramonita LabGouru, Aruna, MD ED   11/27/2017 06:19 12/03/2017 16:25 Full Code [id number]  Arnaldo Nataliamond, Michael S, MD Inpatient   07/08/2017 18:25 07/10/2017 12:49 Full Code [id number]  Altamese DillingVachhani, Vaibhavkumar, MD Inpatient   02/17/2017 14:25 02/19/2017 16:29 Full Code [id number]  Ashley BaasKalisetti, Radhika, MD Inpatient   06/01/2016 02:00 06/02/2016 18:06 Full Code [id number]  Gery Prayrosley, Debby, MD Inpatient   09/30/2015 16:04 10/05/2015 15:50 Full Code [id number]  Altamese DillingVachhani, Vaibhavkumar, MD ED   09/22/2015 17:47 09/24/2015 14:04 Full Code [id number]  Ashley BaasKalisetti, Radhika, MD Inpatient     Family  Communication: Husband and son at the bedside Disposition Plan: Patient will need to move better air prior to disposition  Time spent: 28 minutes  Yonis Carreon [employer]ieting  [employer] [facility]

## 2017-12-19 LAB — PROCALCITONIN: Procalcitonin: <0.1 ng/mL

## 2017-12-19 MED ORDER — MORPHINE SULFATE (CONCENTRATE) 10 MG/0.5ML PO SOLN
5.0000 mg | ORAL | Status: DC | PRN
  Administered 2017-12-19 – 2017-12-20 (×2): 5 mg via ORAL
  Filled 2017-12-19 (×2): qty 1

## 2017-12-19 MED ORDER — SERTRALINE HCL 50 MG PO TABS
50.0000 mg | ORAL_TABLET | Freq: Every day | ORAL | Status: DC
  Administered 2017-12-19 – 2017-12-21 (×3): 50 mg via ORAL
  Filled 2017-12-19 (×3): qty 1

## 2017-12-19 MED ORDER — MORPHINE SULFATE (CONCENTRATE) 10 MG/0.5ML PO SOLN: 5.0000 mg | ORAL | Status: DC | PRN

## 2017-12-19 NOTE — Progress Notes (Signed)
OT Cancellation Note  Patient Details Name: Ashley [name] MRN: [id number] DOB: 1956/09/22   Cancelled Treatment:    Reason Eval/Treat Not Completed: Other (comment). Pt with MD upon attempt. Will re-attempt OT treatment at later time as pt is available.  Richrd PrimeJamie Stiller, MPH, MS, OTR/L ascom [phone] 12/19/17, 2:01 PM

## 2017-12-19 NOTE — Progress Notes (Signed)
PT Cancellation Note  Patient Details Name: Ashley BaasSusan E Deleon MRN: [id number] DOB: Apr 03, 1956   Cancelled Treatment:     Attempted x 3 this date.  Pt requested I return each attempt due to SOB then finally requested to be seen tomorrow.  Stated she was getting ready to try a new medication and would hopefully feel better tomorrow.  Will continue as appropriate.   [name] Praveen Coia 12/19/2017, 3:12 PM

## 2017-12-19 NOTE — Progress Notes (Signed)
Patient ID: Ashley BaasSusan E Bordonaro, female   DOB: [birth year], 61 y.o.   MRN: [id number]  ACP note.  Patient and husband at bedside.  Patient has end-stage COPD.  Spirometry from 3 years ago showed severe COPD.  Patient states that things have progressed over time but over the last month things have been really bad.  She states she is tired.  I discussed that she is end-stage COPD because she is on oxygen.  I described that it is poor prognosis that she keeps coming back into the hospital.  Anxiety could be contributing to her shortness of breath.  I gave IV Ativan yesterday which helped a little bit.  She is on as needed Xanax.  I will start Zoloft.  I also started Roxanol for symptom management.  I discussed palliative care consultation and potential hospice at home.  Patient converted over to a DNR.  Time spent on ACP discussion 20 minutes  Dr. Alford Highlandichard Poet Hineman

## 2017-12-19 NOTE — Plan of Care (Signed)
Pt gets sob with with minimal exertion. O2 sats in the mid 90's, but very anxious. Xanax given for anxiety with some improvement. Zoloft initiated. Roxanol prn. Poor appetite. Pt does not drink the Ensure.

## 2017-12-19 NOTE — Progress Notes (Signed)
Patient ID: Ashley BaasSusan E [name], female   DOB: Jan 22, 1956, 61 y.o.   MRN: [id number]  [employer] [facility] PROGRESS NOTE  Ashley [name] [id number] DOB: Jan 22, 1956 DOA: 12/17/2017 PCP: [name], [name], FNP  HPI/Subjective: Patient feeling very short of breath.  Patient stated that after the IV Ativan given yesterday she felt a little bit better.  She is feeling very short of breath again today.  Anxiety is playing a role in her shortness of breath.  Objective: Vitals:   12/19/17 1255 12/19/17 1300  BP: (!) 130/51   Pulse: 92   Resp: 16   Temp: 99.4 F (37.4 C)   SpO2: 96% 94%    Filed Weights   12/17/17 1145 12/17/17 1732  Weight: 34 kg (75 lb) 32 kg (70 lb 8 oz)    ROS: Review of Systems  Constitutional: Negative for chills and fever.  Eyes: Negative for blurred vision.  Respiratory: Positive for cough, shortness of breath and wheezing.   Cardiovascular: Positive for chest pain.  Gastrointestinal: Negative for abdominal pain, constipation, diarrhea, nausea and vomiting.  Genitourinary: Negative for dysuria.  Musculoskeletal: Negative for joint pain.  Neurological: Negative for dizziness and headaches.   Exam: Physical Exam  Constitutional: She is oriented to person, place, and time.  HENT:  Nose: No mucosal edema.  Mouth/Throat: No oropharyngeal exudate or posterior oropharyngeal edema.  Eyes: Conjunctivae, EOM and lids are normal. Pupils are equal, round, and reactive to light.  Neck: No JVD present. Carotid bruit is not present. No edema present. No thyroid mass and no thyromegaly present.  Cardiovascular: S1 normal and S2 normal. Exam reveals no gallop.  No murmur heard. Pulses:      Dorsalis pedis pulses are 2+ on the right side, and 2+ on the left side.  Respiratory: No respiratory distress. She has decreased breath sounds in the right middle field, the right lower field, the left middle field and the left lower field. She has no wheezes. She has no rhonchi. She has no  rales.  GI: Soft. Bowel sounds are normal. There is no tenderness.  Musculoskeletal:       Right ankle: She exhibits no swelling.       Left ankle: She exhibits no swelling.  Lymphadenopathy:    She has no cervical adenopathy.  Neurological: She is alert and oriented to person, place, and time. No cranial nerve deficit.  Skin: Skin is warm. No rash noted. Nails show no clubbing.  Psychiatric: She has a normal mood and affect.      Data Reviewed: Basic Metabolic Panel: Recent Labs  Lab 12/13/17 0738 12/14/17 0556 12/17/17 1217  NA 139 143 141  K 3.9 4.2 3.9  CL 99* 109 98*  CO2 31 30 36*  GLUCOSE 118* 136* 90  BUN 12 17 10   CREATININE 0.75 0.48 0.57  CALCIUM 9.2 9.1 9.3   Liver Function Tests: Recent Labs  Lab 12/13/17 0738 12/17/17 1217  AST 26 22  ALT 18 19  ALKPHOS 100 117  BILITOT 1.0 0.8  PROT 6.9 7.2  ALBUMIN 3.8 4.0   CBC: Recent Labs  Lab 12/13/17 0738 12/14/17 0556 12/17/17 1217  WBC 7.1 12.6* 7.2  NEUTROABS 4.8  --  6.2  HGB 15.5 12.9 15.3  HCT 46.2 38.4 46.5  MCV 91.3 91.5 91.7  PLT 208 155 192   Cardiac Enzymes: Recent Labs  Lab 12/13/17 0738  TROPONINI <0.03   BNP (last 3 results) Recent Labs    11/27/17 0407 12/13/17 [id number]  BNP 45.0 42.0       Scheduled Meds: . amiodarone  200 mg Oral Daily  . apixaban  5 mg Oral BID  . docusate sodium  100 mg Oral BID  . feeding supplement (ENSURE ENLIVE)  1 Bottle Oral TID BM  . ipratropium-albuterol  3 mL Nebulization Q6H  . mouth rinse  15 mL Mouth Rinse BID  . methylPREDNISolone (SOLU-MEDROL) injection  40 mg Intravenous Daily  . mometasone-formoterol  2 puff Inhalation BID  . montelukast  10 mg Oral QHS  . multivitamin with minerals  1 tablet Oral Daily  . pantoprazole  40 mg Oral Daily  . sertraline  50 mg Oral Daily  . tiotropium  18 mcg Inhalation Daily    Assessment/Plan:  1. Acute on chronic respiratory failure.  Continue oxygen supplementation. 2. COPD exacerbation on  Solu-Medrol daily, continue DuoNeb nebulizer solution.  Patient did not do well after budesonide nebulizers switch [facility]. 3. Anxiety.  Continue Xanax and start Zoloft 4. Chronic atrial fibrillation on amiodarone and Eliquis. 5. Severe malnutrition on Ensure 6. Patient states that she stopped smoking 1 month ago but as per prior notes may still be smoking. 7. Weakness.  Physical therapy evaluation  Code Status:     Code Status Orders  (From admission, onward)        Start     Ordered   12/17/17 1746  Full code  Continuous     12/17/17 1745    Code Status History    Date Active Date Inactive Code Status Order ID Comments User Context   12/13/2017 11:43 12/15/2017 15:55 Full Code [id number]  Ramonita LabGouru, Aruna, MD ED   11/27/2017 06:19 12/03/2017 16:25 Full Code [id number]  Arnaldo Nataliamond, Michael S, MD Inpatient   07/08/2017 18:25 07/10/2017 12:49 Full Code [id number]  Altamese DillingVachhani, Vaibhavkumar, MD Inpatient   02/17/2017 14:25 02/19/2017 16:29 Full Code [id number]  Ashley BaasKalisetti, Radhika, MD Inpatient   06/01/2016 02:00 06/02/2016 18:06 Full Code [id number]  Gery Prayrosley, Debby, MD Inpatient   09/30/2015 16:04 10/05/2015 15:50 Full Code [id number]  Altamese DillingVachhani, Vaibhavkumar, MD ED   09/22/2015 17:47 09/24/2015 14:04 Full Code [id number]  Ashley BaasKalisetti, Radhika, MD Inpatient     Family Communication: Husband at the bedside Disposition Plan: To be determined  Time spent: 45 minutes, including ACP time  [employer] Corporationichard Duran Ohern  [facility]

## 2017-12-19 NOTE — Progress Notes (Signed)
OT Cancellation Note  Patient Details Name: Ashley [name] MRN: [id number] DOB: 1956-12-18   Cancelled Treatment:    Reason Eval/Treat Not Completed: Patient declined, no reason specified. Upon 2nd attempt to treat, pt politely declining OT treatment with focus on energy conservation this date, despite encouragement, stating, "they already went over all that with me, honey". Left handout on energy conservation strategies for pt to review at her leisure. Pt thanked OT. Will re-attempt at later date/time as pt is available and willing to participate.  Richrd PrimeJamie Stiller, MPH, MS, OTR/L ascom [phone] 12/19/17, 3:09 PM

## 2017-12-19 NOTE — Consult Note (Signed)
Consultation Note Date: 12/19/2017   Patient Name: Ashley [name]  DOB: [birth year]  MRN: [id number]  Age / Sex: 61 y.o., female  PCP: [name], [name], FNP Referring Physician: Alford HighlandWieting, Richard, MD  Reason for Consultation: Establishing goals of care  HPI/Patient Profile: Ashley [name]  is a 61 y.o. female with a known history of chronic COPD, multiple multiple admissions for COPD exacerbation, essential hypertension, chronic atrial fibrillation, anxiety was admitted on December 5 and December 21 with same complaints. Patient reports that she has been so short of breath and has had a cough.    Clinical Assessment and Goals of Care: Ms. Ashley [name] is resting in bed. She is alert and oriented. She states she has anxiety at baseline. She states she 1 month ago she was able to shop and do light chores around the house. For the past month, she has been unable to get from the bed or couch to the bathroom. She has not bee eating as well.   She states [place] [facility] [place] [facility] [place]. She states her COPD was worsening at that time. She continued to smoke a pack of cigarettes per day until last month.   We discussed diagnosis, prognosis, GOC, EOL wishes disposition and options.  A detailed discussion was had today regarding advanced directives.  Concepts specific to code status, artifical feeding and hydration, IV antibiotics and rehospitalization was had.  The difference between an aggressive medical intervention path and a hospice comfort care path for this patient at this time was had.  Values and goals of care important to patient and family were discussed.  MOST form: DNR section B with additional measures, no feeding tube, okay with IVF and abx.   Concept of Hospice and Palliative Care were discussed. Family leaning toward Hospice. Will revist tomorrow at 12:00     SUMMARY OF RECOMMENDATIONS   Now  DNR/DNI. Will revisit for further GOC tomorrow.   Code Status/Advance Care Planning:  DNR    Symptom Management:   Agree with Zoloft  Agree with Morphine for anxiety and air hunger.   Palliative Prophylaxis:   Aspiration  Additional Recommendations (Limitations, Scope, Preferences):  No Artificial Feeding and No Tracheostomy   Prognosis:   < 6 months Depending on response to medications.   Discharge Planning: To Be Determined      Primary Diagnoses: Present on Admission: . Acute exacerbation of chronic obstructive pulmonary disease (COPD) (HCC)   I have reviewed the medical record, interviewed the patient and family, and examined the patient. The following aspects are pertinent.  Past Medical History:  Diagnosis Date  . Anxiety   . COPD (chronic obstructive pulmonary disease) (HCC)   . Hypertension    Social History   Socioeconomic History  . Marital status: Married    Spouse name: None  . Number of children: None  . Years of education: None  . Highest education level: None  Social Needs  . Financial resource strain: None  . Food insecurity - worry: None  .  Food insecurity - inability: None  . Transportation needs - medical: None  . Transportation needs - non-medical: None  Occupational History  . None  Tobacco Use  . Smoking status: Current Every Day Smoker    Packs/day: 0.50    Types: Cigarettes  . Smokeless tobacco: Never Used  Substance and Sexual Activity  . Alcohol use: No  . Drug use: No  . Sexual activity: Yes  Other Topics Concern  . None  Social History Narrative   Lives at home with husband. Steady on ambulation normally at home. Dyspnea with exertion at baseline.   Family History  Problem Relation Age of Onset  . CAD Mother   . CAD Father   . Cancer - Prostate Brother   . Cancer - Lung Brother    Scheduled Meds: . amiodarone  200 mg Oral Daily  . apixaban  5 mg Oral BID  . docusate sodium  100 mg Oral BID  . feeding  supplement (ENSURE ENLIVE)  1 Bottle Oral TID BM  . ipratropium-albuterol  3 mL Nebulization Q6H  . mouth rinse  15 mL Mouth Rinse BID  . methylPREDNISolone (SOLU-MEDROL) injection  40 mg Intravenous Daily  . mometasone-formoterol  2 puff Inhalation BID  . montelukast  10 mg Oral QHS  . multivitamin with minerals  1 tablet Oral Daily  . pantoprazole  40 mg Oral Daily  . sertraline  50 mg Oral Daily  . tiotropium  18 mcg Inhalation Daily   Continuous Infusions: PRN Meds:.albuterol, ALPRAZolam, morphine CONCENTRATE Medications Prior to Admission:  Prior to Admission medications   Medication Sig Start Date End Date Taking? Authorizing Provider  albuterol (PROAIR HFA) 108 (90 Base) MCG/ACT inhaler Inhale 2 puffs into the lungs every 6 (six) hours as needed for wheezing or shortness of breath. 06/02/16  Yes Gouru, Deanna ArtisAruna, MD  ALPRAZolam Prudy Feeler(XANAX) 0.25 MG tablet Take 1 tablet (0.25 mg total) by mouth at bedtime as needed for anxiety. 12/03/17  Yes Ashley BaasKalisetti, Radhika, MD  amiodarone (PACERONE) 200 MG tablet Take 1 tablet (200 mg total) by mouth daily. 12/15/17  Yes Sainani, Rolly PancakeVivek J, MD  apixaban (ELIQUIS) 5 MG TABS tablet Take 1 tablet (5 mg total) by mouth 2 (two) times daily. 12/03/17  Yes Ashley BaasKalisetti, Radhika, MD  budesonide-formoterol (SYMBICORT) 160-4.5 MCG/ACT inhaler Inhale 2 puffs into the lungs 2 (two) times daily. 12/15/17  Yes Sainani, Rolly PancakeVivek J, MD  feeding supplement, ENSURE ENLIVE, (ENSURE ENLIVE) LIQD Take 237 mLs by mouth 3 (three) times daily between meals. 06/02/16  Yes Gouru, Aruna, MD  ipratropium-albuterol (DUONEB) 0.5-2.5 (3) MG/3ML SOLN Take 3 mLs by nebulization every 6 (six) hours as needed. 12/15/17  Yes Sainani, Rolly PancakeVivek J, MD  predniSONE (DELTASONE) 10 MG tablet Label  & dispense according to the schedule below. 5 Pills PO for 2 days then, 4 Pills PO for 2 days, 3 Pills PO for 2 days, 2 Pills PO for 2 days, 1 Pill PO for 2 days then STOP. 12/15/17  Yes Sainani, Rolly PancakeVivek J, MD    tiotropium (SPIRIVA) 18 MCG inhalation capsule Place 18 mcg into inhaler and inhale daily.   Yes [provider]  docusate sodium (COLACE) 100 MG capsule Take 1 capsule (100 mg total) by mouth 2 (two) times daily. Patient not taking: Reported on 12/13/2017 12/03/17   Ashley BaasKalisetti, Radhika, MD   Allergies  Allergen Reactions  . Codeine Nausea Only   Review of Systems  Respiratory: Positive for cough, chest tightness, shortness of breath and wheezing.  Physical Exam  HENT:  Head: Atraumatic.  Pulmonary/Chest:  Some work of breathing noted.  Skin: Skin is warm and dry.    Vital Signs: BP (!) 130/51 (BP Location: Left Arm)   Pulse 92   Temp 99.4 F (37.4 C) (Oral)   Resp 16   Ht 5\' 2"  (1.575 m)   Wt 32 kg (70 lb 8 oz)   SpO2 94%   BMI 12.89 kg/m  Pain Assessment: 0-10   Pain Score: 0-No pain   SpO2: SpO2: 94 % O2 Device:SpO2: 94 % O2 Flow Rate: .O2 Flow Rate (L/min): 2 L/min  IO: Intake/output summary:   Intake/Output Summary (Last 24 hours) at 12/19/2017 1612 Last data filed at 12/19/2017 0900 Gross per 24 hour  Intake 360 ml  Output -  Net 360 ml    LBM: Last BM Date: 12/19/17 Baseline Weight: Weight: 34 kg (75 lb) Most recent weight: Weight: 32 kg (70 lb 8 oz)     Palliative Assessment/Data: 40%     Time In: 3:30 Time Out: 4:40 Time Total: 70 min Greater than 50%  of this time was spent counseling and coordinating care related to the above assessment and plan.  Signed by: Morton Stall, NP   Please contact Palliative Medicine Team phone at [phone] for questions and concerns.  For individual provider: See Loretha Stapler

## 2017-12-20 DIAGNOSIS — Z515 Encounter for palliative care: Secondary | ICD-10-CM

## 2017-12-20 DIAGNOSIS — Z7189 Other specified counseling: Secondary | ICD-10-CM

## 2017-12-20 DIAGNOSIS — J441 Chronic obstructive pulmonary disease with (acute) exacerbation: Principal | ICD-10-CM

## 2017-12-20 LAB — PROCALCITONIN: Procalcitonin: <0.1 ng/mL

## 2017-12-20 MED ORDER — ALPRAZOLAM 0.25 MG PO TABS: 0.2500 mg | ORAL_TABLET | Freq: Every evening | ORAL | Status: DC | PRN

## 2017-12-20 MED ORDER — ONDANSETRON HCL 4 MG/2ML IJ SOLN
4.0000 mg | Freq: Four times a day (QID) | INTRAMUSCULAR | Status: DC | PRN
  Administered 2017-12-20: 4 mg via INTRAVENOUS
  Filled 2017-12-20: qty 2

## 2017-12-20 MED ORDER — ALPRAZOLAM 0.25 MG PO TABS
0.2500 mg | ORAL_TABLET | Freq: Four times a day (QID) | ORAL | Status: DC | PRN
  Administered 2017-12-20 – 2017-12-21 (×2): 0.25 mg via ORAL
  Filled 2017-12-20 (×2): qty 1

## 2017-12-20 MED ORDER — NYSTATIN 100000 UNIT/ML MT SUSP
5.0000 mL | Freq: Four times a day (QID) | OROMUCOSAL | Status: DC
  Administered 2017-12-20 – 2017-12-21 (×4): 500000 [IU] via ORAL
  Filled 2017-12-20 (×4): qty 5

## 2017-12-20 MED ORDER — MORPHINE SULFATE (CONCENTRATE) 10 MG/0.5ML PO SOLN
3.0000 mg | ORAL | Status: DC | PRN
  Administered 2017-12-21: 14:00:00 3 mg via ORAL
  Filled 2017-12-20: qty 1

## 2017-12-20 NOTE — Care Management (Signed)
Spoke with Fulton Reekrystal Williams, Palliative NP. States that she is recommending home with hospice services.  Discussed Hosp[facility] Service [facility]. Will update Dayna BarkerKaren Robertson RN representative for [facility] Gwenette GreetBrenda S Marvin Grabill RN MSN [facility] Care management [phone]

## 2017-12-20 NOTE — Progress Notes (Signed)
New referral for [facility] at home received from [facility] following a Palliative Medicine consult. Patient is a 61 year old woman with a known history of chronic COPD and  multiple admissions for COPD exacerbation, essential hypertension, chronic atrial fibrillation and anxiety She was admitted on December 5 and December 21 with same complaints. Palliative Medicine was consulted for goals of care and have met with patient and her family over the last few days. They have chosen to return home with the support of hospice services. Writer met in the family room with patient's husband Ashley [name] and her brother Ashley [name] to initiate education regarding hospice services, philosophy and team approach to care with understanding voiced. Patient will require a hospital bed and oxygen to be in place prior to discharge. Plan is for discharge tomorrow by car. Hospice information and contact number given to [name].  Patient seen sitting up in bed, alert, appreciative of services to be provided, much reassurance given top both patient and her husband regarding current lack of insurance coverage. Patient will discharge with signed DNR and MOST form (in place in hospital chart) as well as prescriptions for xanax and liquid morphine. Prescriptions reviewed with attending physician Dr. Leslye Peer today. Patient information faxed to referral. DME ordered. Hospital care team updated. Flo Shanks RN, BSN, [facility] [place], hospital Liaison [phone]

## 2017-12-20 NOTE — Plan of Care (Signed)
  Progressing Education: Knowledge of General Education information will improve 12/20/2017 1546 - Progressing by Kathreen CosierMalcolm, Neidra Girvan A, RN Health Behavior/Discharge Planning: Ability to manage health-related needs will improve 12/20/2017 1546 - Progressing by Kathreen CosierMalcolm, Clark Cuff A, RN Clinical Measurements: Ability to maintain clinical measurements within normal limits will improve 12/20/2017 1546 - Progressing by Kathreen CosierMalcolm, Emilliano Dilworth A, RN Will remain free from infection 12/20/2017 1546 - Progressing by Kathreen CosierMalcolm, Mylena Sedberry A, RN Diagnostic test results will improve 12/20/2017 1546 - Progressing by Kathreen CosierMalcolm, Sulaiman Imbert A, RN Respiratory complications will improve 12/20/2017 1546 - Progressing by Kathreen CosierMalcolm, Magdalyn Arenivas A, RN Cardiovascular complication will be avoided 12/20/2017 1546 - Progressing by Kathreen CosierMalcolm, Messi Twedt A, RN Nutrition: Adequate nutrition will be maintained 12/20/2017 1546 - Progressing by Kathreen CosierMalcolm, Brodrick Curran A, RN Pain Managment: General experience of comfort will improve 12/20/2017 1546 - Progressing by Kathreen CosierMalcolm, Amreen Raczkowski A, RN Activity: Ability to implement measures to reduce episodes of fatigue will improve 12/20/2017 1546 - Progressing by Kathreen CosierMalcolm, Blakeley Scheier A, RN Ability to tolerate increased activity will improve 12/20/2017 1546 - Progressing by Kathreen CosierMalcolm, Amarah Brossman A, RN Will verbalize the importance of balancing activity with adequate rest periods 12/20/2017 1546 - Progressing by Kathreen CosierMalcolm, Roseline Ebarb A, RN Education: Knowledge of disease or condition will improve 12/20/2017 1546 - Progressing by Kathreen CosierMalcolm, Terez Montee A, RN Knowledge of the prescribed therapeutic regimen will improve 12/20/2017 1546 - Progressing by Kathreen CosierMalcolm, Doretha Goding A, RN

## 2017-12-20 NOTE — Progress Notes (Addendum)
Daily Progress Note   Patient Name: Ashley [name]       Date: 12/20/2017 DOB: 02-17-1956  Age: 61 y.o. MRN#: [id number] Attending Physician: Alford Highland, MD Primary Care Physician: Toy Cookey, FNP Admit Date: 12/17/2017  Reason for Consultation/Follow-up: Establishing goals of care  Subjective: Ashley [name] is resting in bed. She states the doses of Morphine have helped, but make her feel loopy. She appears calm. She states with exertion she remains very SOB. Morphine dose decreased to 3mg  from 5mg . She has severe COPD and no longer sees benefit in coming to the hospital and would like to go home and stay there without return to the hospital. She would like to be as comfortable as possible for the time she has left. Large family in to see patient and per her request, their questions were answered as well as possible.    MOST form completed for comfort level care.   Length of Stay: 3  Current Medications: Scheduled Meds:  . amiodarone  200 mg Oral Daily  . apixaban  5 mg Oral BID  . docusate sodium  100 mg Oral BID  . feeding supplement (ENSURE ENLIVE)  1 Bottle Oral TID BM  . ipratropium-albuterol  3 mL Nebulization Q6H  . mouth rinse  15 mL Mouth Rinse BID  . methylPREDNISolone (SOLU-MEDROL) injection  40 mg Intravenous Daily  . mometasone-formoterol  2 puff Inhalation BID  . montelukast  10 mg Oral QHS  . multivitamin with minerals  1 tablet Oral Daily  . nystatin  5 mL Oral QID  . pantoprazole  40 mg Oral Daily  . sertraline  50 mg Oral Daily  . tiotropium  18 mcg Inhalation Daily    Continuous Infusions:   PRN Meds: albuterol, ALPRAZolam, morphine CONCENTRATE, ondansetron (ZOFRAN) IV  Physical Exam  Constitutional: No distress.  Pulmonary/Chest: Effort normal.    Neurological: She is alert.  Oriented  Skin: Skin is warm and dry.            Vital Signs: BP (!) 169/75 (BP Location: Left Arm)   Pulse 78   Temp 98.6 F (37 C) (Oral)   Resp 18   Ht 5\' 2"  (1.575 m)   Wt 32 kg (70 lb 8 oz)   SpO2 92%   BMI 12.89 kg/m  SpO2: SpO2:  92 % O2 Device: O2 Device: Nasal Cannula O2 Flow Rate: O2 Flow Rate (L/min): 2 L/min  Intake/output summary: No intake or output data in the 24 hours ending 12/20/17 1305 LBM: Last BM Date: 12/19/17 Baseline Weight: Weight: 34 kg (75 lb) Most recent weight: Weight: 32 kg (70 lb 8 oz)       Palliative Assessment/Data: 30%    Flowsheet Rows     Most Recent Value  Intake Tab  Referral Department  Hospitalist  Unit at Time of Referral  Med/Surg Unit  Palliative Care Primary Diagnosis  Pulmonary  Date Notified  12/19/17  Palliative Care Type  New Palliative care  Reason for referral  Clarify Goals of Care  Date of Admission  12/17/17  Date first seen by Palliative Care  12/19/17  # of days Palliative referral response time  0 Day(s)  # of days IP prior to Palliative referral  2  Clinical Assessment  Psychosocial & Spiritual Assessment  Palliative Care Outcomes      Patient Active Problem List   Diagnosis Date Noted  . Acute exacerbation of chronic obstructive pulmonary disease (COPD) (HCC) 12/17/2017  . Acute on chronic respiratory failure with hypoxia (HCC) 11/27/2017  . COPD with acute exacerbation (HCC) 07/08/2017  . COPD exacerbation (HCC) 07/08/2017  . Sepsis (HCC) 02/17/2017  . COPD (chronic obstructive pulmonary disease) (HCC) 06/01/2016  . HTN (hypertension) 06/01/2016  . Diarrhea 10/05/2015  . Protein-calorie malnutrition, severe 10/02/2015  . Malnutrition (HCC) 10/02/2015  . Pressure ulcer 10/01/2015  . SVT (supraventricular tachycardia) (HCC) 09/30/2015  . Pneumonia 09/30/2015  . Chest pain 09/22/2015    Palliative Care Assessment & Plan   Patient Profile: Ashley RightSusanKingis a61  y.o.femalewith a known history of chronic COPD,multiple multiple admissions for COPD exacerbation, essential hypertension, chronic atrial fibrillation, anxiety was admitted on December 5 and December 21 with same complaints. Patient reports that she has been so short of breath and has had a cough.  Assessment/Recommendations/Plan:  Recommend home hospice.   Goals of Care and Additional Recommendations:  Limitations on Scope of Treatment: No Artificial Feeding, No Diagnostics, No IV Antibiotics, No IV Fluids and No Lab Draws  Code Status:    Code Status Orders  (From admission, onward)        Start     Ordered   12/19/17 1612  Do not attempt resuscitation (DNR)  Continuous    Question Answer Comment  In the event of cardiac or respiratory ARREST Do not call a "code blue"   In the event of cardiac or respiratory ARREST Do not perform Intubation, CPR, defibrillation or ACLS   In the event of cardiac or respiratory ARREST Use medication by any route, position, wound care, and other measures to relive pain and suffering. May use oxygen, suction and manual treatment of airway obstruction as needed for comfort.      12/19/17 1611    Code Status History    Date Active Date Inactive Code Status Order ID Comments User Context   12/17/2017 17:45 12/19/2017 16:11 Full Code [id number]  Ramonita LabGouru, Aruna, MD Inpatient   12/13/2017 11:43 12/15/2017 15:55 Full Code [id number]  Ramonita LabGouru, Aruna, MD ED   11/27/2017 06:19 12/03/2017 16:25 Full Code [id number]  Arnaldo Nataliamond, Michael S, MD Inpatient   07/08/2017 18:25 07/10/2017 12:49 Full Code [id number]  Altamese DillingVachhani, Vaibhavkumar, MD Inpatient   02/17/2017 14:25 02/19/2017 16:29 Full Code [id number]  Enid BaasKalisetti, Radhika, MD Inpatient   06/01/2016 02:00 06/02/2016 18:06 Full Code [id number]  Gery Prayrosley, Debby, MD Inpatient  09/30/2015 16:04 10/05/2015 15:50 Full Code [id number]  Altamese DillingVachhani, Vaibhavkumar, MD ED   09/22/2015 17:47 09/24/2015 14:04 Full Code [id number]  Enid BaasKalisetti, Radhika,  MD Inpatient       Prognosis:   < 6 months End stage COPD and functional decline such that patient cannot get out of bed and walk more than a few steps from the bedside. Severe anxiety from SOB. Per notes[facility] showing severe [facility] and continued smoking of a pack per day until approx 1 month ago.   Discharge Planning:  Home with Hospice  Care plan was discussed with Dr. Renae GlossWieting  Thank you for allowing the Palliative Medicine Team to assist in the care of this patient.   Time In: 12:15 Time Out: 1:20 Total Time 65 min Prolonged Time Billed  Yes      Greater than 50%  of this time was spent counseling and coordinating care related to the above assessment and plan.  Morton Stallrystal Radley Barto, NP  Please contact Palliative Medicine Team phone at [phone] for questions and concerns.

## 2017-12-20 NOTE — Progress Notes (Signed)
Patient ID: Ashley BaasSusan E Lonzo, female   DOB: [birth year], 61 y.o.   MRN: [id number]   [employer] [facility] PROGRESS NOTE  Ashley [name] [id number] DOB: [birth year] DOA: 12/17/2017 PCP: [name], [name], FNP  HPI/Subjective: Patient tried the Roxanol last night and did well with it.  She stated it helped her breathing.  She did not try another dose yet.  I asked the nurse around 12 noon to give another dose.  Objective: Vitals:   12/19/17 2037 12/20/17 0507  BP: (!) 145/67 (!) 169/75  Pulse: 76 78  Resp: 20 18  Temp: 98.6 F (37 C) 98.6 F (37 C)  SpO2: 92% 92%    Filed Weights   12/17/17 1145 12/17/17 1732  Weight: 34 kg (75 lb) 32 kg (70 lb 8 oz)    ROS: Review of Systems  Constitutional: Negative for chills and fever.  Eyes: Negative for blurred vision.  Respiratory: Positive for cough, shortness of breath and wheezing.   Cardiovascular: Positive for chest pain.  Gastrointestinal: Negative for abdominal pain, constipation, diarrhea, nausea and vomiting.  Genitourinary: Negative for dysuria.  Musculoskeletal: Negative for joint pain.  Neurological: Negative for dizziness and headaches.   Exam: Physical Exam  Constitutional: She is oriented to person, place, and time.  HENT:  Nose: No mucosal edema.  Mouth/Throat: No oropharyngeal exudate or posterior oropharyngeal edema.  Positive for thrush on tongue  Eyes: Conjunctivae, EOM and lids are normal. Pupils are equal, round, and reactive to light.  Neck: No JVD present. Carotid bruit is not present. No edema present. No thyroid mass and no thyromegaly present.  Cardiovascular: S1 normal and S2 normal. Exam reveals no gallop.  No murmur heard. Pulses:      Dorsalis pedis pulses are 2+ on the right side, and 2+ on the left side.  Respiratory: No respiratory distress. She has decreased breath sounds in the right middle field, the right lower field, the left middle field and the left lower field. She has no wheezes. She has no  rhonchi. She has no rales.  GI: Soft. Bowel sounds are normal. There is no tenderness.  Musculoskeletal:       Right ankle: She exhibits no swelling.       Left ankle: She exhibits no swelling.  Lymphadenopathy:    She has no cervical adenopathy.  Neurological: She is alert and oriented to person, place, and time. No cranial nerve deficit.  Skin: Skin is warm. No rash noted. Nails show no clubbing.  Psychiatric: She has a normal mood and affect.      Data Reviewed: Basic Metabolic Panel: Recent Labs  Lab 12/14/17 0556 12/17/17 1217  NA 143 141  K 4.2 3.9  CL 109 98*  CO2 30 36*  GLUCOSE 136* 90  BUN 17 10  CREATININE 0.48 0.57  CALCIUM 9.1 9.3   Liver Function Tests: Recent Labs  Lab 12/17/17 1217  AST 22  ALT 19  ALKPHOS 117  BILITOT 0.8  PROT 7.2  ALBUMIN 4.0   CBC: Recent Labs  Lab 12/14/17 0556 12/17/17 1217  WBC 12.6* 7.2  NEUTROABS  --  6.2  HGB 12.9 15.3  HCT 38.4 46.5  MCV 91.5 91.7  PLT 155 192   BNP (last 3 results) Recent Labs    11/27/17 0407 12/13/17 0738  BNP 45.0 42.0     Scheduled Meds: . amiodarone  200 mg Oral Daily  . apixaban  5 mg Oral BID  . docusate sodium  100 mg Oral BID  .  feeding supplement (ENSURE ENLIVE)  1 Bottle Oral TID BM  . ipratropium-albuterol  3 mL Nebulization Q6H  . mouth rinse  15 mL Mouth Rinse BID  . methylPREDNISolone (SOLU-MEDROL) injection  40 mg Intravenous Daily  . mometasone-formoterol  2 puff Inhalation BID  . montelukast  10 mg Oral QHS  . nystatin  5 mL Oral QID  . pantoprazole  40 mg Oral Daily  . sertraline  50 mg Oral Daily  . tiotropium  18 mcg Inhalation Daily    Assessment/Plan:  1. Acute on chronic respiratory failure.  Continue oxygen supplementation. 2. COPD exacerbation on Solu-Medrol daily, continue DuoNeb nebulizer solution.  Patient did not do well after budesonide nebulizers [facility].  Felt better after Roxanol given. 3. Anxiety.  Continue Xanax and  started Zoloft yesterday. 4. Chronic atrial fibrillation on amiodarone and Eliquis. 5. Severe malnutrition on Ensure 6. Patient states that she stopped smoking 1 month ago but as per prior notes may still be smoking. 7. Weakness.  Very limited with her movement. 8. Case discussed with palliative care team and hospice liaison. Potential discharge home tomorrow with hospice.  Code Status:     Code Status Orders  (From admission, onward)        Start     Ordered   12/17/17 1746  Full code  Continuous     12/17/17 1745    Code Status History    Date Active Date Inactive Code Status Order ID Comments User Context   12/13/2017 11:43 12/15/2017 15:55 Full Code [id number]  Ramonita LabGouru, Aruna, MD ED   11/27/2017 06:19 12/03/2017 16:25 Full Code [id number]  Arnaldo Nataliamond, Michael S, MD Inpatient   07/08/2017 18:25 07/10/2017 12:49 Full Code [id number]  Altamese DillingVachhani, Vaibhavkumar, MD Inpatient   02/17/2017 14:25 02/19/2017 16:29 Full Code [id number]  Ashley BaasKalisetti, Radhika, MD Inpatient   06/01/2016 02:00 06/02/2016 18:06 Full Code [id number]  Gery Prayrosley, Debby, MD Inpatient   09/30/2015 16:04 10/05/2015 15:50 Full Code [id number]  Altamese DillingVachhani, Vaibhavkumar, MD ED   09/22/2015 17:47 09/24/2015 14:04 Full Code [id number]  Ashley BaasKalisetti, Radhika, MD Inpatient     Family Communication: Husband and family at the bedside Disposition Plan: Potentially home with hospice tomorrow  Time spent: 26 minutes.  Case discussed with hospice liaison and palliative care nurse.  Rexann Lueras [employer]ieting  [employer]

## 2017-12-20 NOTE — Progress Notes (Signed)
Physical Therapy Treatment Patient Details Name: Ashley [name] MRN: [id number] DOB: 1956-04-26 Today's Date: 12/20/2017    History of Present Illness Pt is a 61 y/o F who presented with SOB and cough.  Pt with multiple recent admissions for COPD exacerbation and pt continues to smoke, agreeable to nicotine patch this admission. Chest x-ray negative.  Pt chronically on 2L O2.     PT Comments    Pt presented in bed with family/visitors present. Pt agreeable to attempt PT but requesting to use BSC commode first. Pt mod I with bed mobility, supervision with transfers, and min guard with short distance gait. Pt with LOB when turning to [facility] with PTA guarding for correction. Pt noted to be consistently using axillary ms for breathing despite cues for PLB. Pt stating feeling too fatigued after use of BSC to continue with any activities. Pt returned to bed and left with husband present and needs met.   Follow Up Recommendations        Equipment Recommendations  None recommended by PT    Recommendations for Other Services       Precautions / Restrictions Precautions Precautions: Fall Precaution Comments: O2 Restrictions Weight Bearing Restrictions: No    Mobility  Bed Mobility Overal bed mobility: Independent             General bed mobility comments: No physical assist or cues needed.  Pt performs independently.   Transfers Overall transfer level: Needs assistance Equipment used: None Transfers: Sit to/from Stand Sit to Stand: Supervision         General transfer comment: from bed and [facility]  Ambulation/Gait Ambulation/Gait assistance: Min guard Ambulation Distance (Feet): 6 Feet Assistive device: None Gait Pattern/deviations: Step-through pattern;Narrow base of support Gait velocity: decreased Gait velocity interpretation: Below normal speed for age/gender General Gait Details: one later LOB when ambulating to [facility]   Stairs            Wheelchair Mobility     Modified Rankin (Stroke Patients Only)       Balance Overall balance assessment: Needs assistance Sitting-balance support: No upper extremity supported;Feet supported Sitting balance-Leahy Scale: Fair     Standing balance support: No upper extremity supported;During functional activity Standing balance-Leahy Scale: Fair                              Cognition Arousal/Alertness: Awake/alert Behavior During Therapy: Anxious Overall Cognitive Status: Within Functional Limits for tasks assessed                                        Exercises      General Comments        Pertinent Vitals/Pain Pain Assessment: No/denies pain    Home Living                      Prior Function            PT Goals (current goals can now be found in the care plan section) Acute Rehab PT Goals Patient Stated Goal: to be able to take care of myself PT Goal Formulation: With patient Time For Goal Achievement: 01/01/18 Potential to Achieve Goals: Fair Progress towards PT goals: Not progressing toward goals - comment    Frequency    Min 2X/week      PT Plan  Co-evaluation              AM-PAC PT "6 Clicks" Daily Activity  Outcome Measure  Difficulty turning over in bed (including adjusting bedclothes, sheets and blankets)?: None Difficulty moving from lying on back to sitting on the side of the bed? : None Difficulty sitting down on and standing up from a chair with arms (e.g., wheelchair, bedside commode, etc,.)?: A Little Help needed moving to and from a bed to chair (including a wheelchair)?: A Little Help needed walking in hospital room?: A Little Help needed climbing 3-5 steps with a railing? : A Lot 6 Click Score: 19    End of Session Equipment Utilized During Treatment: Gait belt;Oxygen Activity Tolerance: Patient limited by fatigue Patient left: in bed;with call bell/phone within reach;with bed alarm set;with  family/visitor present   PT Visit Diagnosis: Unsteadiness on feet (R26.81)     Time: 0370-9643 PT Time Calculation (min) (ACUTE ONLY): 10 min  Charges:  $Therapeutic Activity: 8-22 mins                      Niklas Chretien  Deborha Moseley, PTA 12/20/2017, 2:43 PM

## 2017-12-20 NOTE — Progress Notes (Signed)
PT Cancellation Note  Patient Details Name: Ashley [name] MRN: [id number] DOB: August 14, 1956   Cancelled Treatment:    Reason Eval/Treat Not Completed: Other (comment)   Pt with large amount of visitors in room.  Will return as time allows today.   [name] [name] 12/20/2017, 11:25 AM

## 2017-12-20 NOTE — Care Management Note (Signed)
Case Management Note  Patient Details  Name: Enid BaasSusan E Burpee MRN: [id number] Date of Birth: 01-23-1956  Subjective/Objective:                    Action/Plan:   Expected Discharge Date:  12/19/17               Expected Discharge Plan:     In-House Referral:     Discharge planning Services     Post Acute Care Choice:    Choice offered to:     DME Arranged:    DME Agency:     HH Arranged:    HH Agency:     Status of Service:     If discussed at [employer] Length of Stay Meetings, dates discussed:    Additional Comments:  Gwenette GreetBrenda S Sir Mallis, RN 12/20/2017, 1:03 PM

## 2017-12-20 NOTE — Progress Notes (Signed)
OT Cancellation Note  Patient Details Name: Ashley BaasSusan E Storer MRN: [id number] DOB: June 18, 1956   Cancelled Treatment:    Reason Eval/Treat Not Completed: Medical issues which prohibited therapy(OT services withheld today. Pt. is transitioning to Hospice care.  Olegario MessierElaine Classie Weng, MS, OTR/L 12/20/2017, 4:04 PM

## 2017-12-21 MED ORDER — SERTRALINE HCL 50 MG PO TABS
50.0000 mg | ORAL_TABLET | Freq: Every day | ORAL | 0 refills | Status: AC
Start: 2017-12-22 — End: ?

## 2017-12-21 MED ORDER — BUDESONIDE-FORMOTEROL FUMARATE 160-4.5 MCG/ACT IN AERO: 2.0000 | INHALATION_SPRAY | Freq: Two times a day (BID) | RESPIRATORY_TRACT | 0 refills | Status: AC

## 2017-12-21 MED ORDER — ALBUTEROL SULFATE HFA 108 (90 BASE) MCG/ACT IN AERS: 2.0000 | INHALATION_SPRAY | Freq: Four times a day (QID) | RESPIRATORY_TRACT | 1 refills | Status: AC | PRN

## 2017-12-21 MED ORDER — IPRATROPIUM-ALBUTEROL 0.5-2.5 (3) MG/3ML IN SOLN: 3.0000 mL | Freq: Four times a day (QID) | RESPIRATORY_TRACT | 1 refills | Status: AC | PRN

## 2017-12-21 MED ORDER — PREDNISONE 10 MG PO TABS: ORAL_TABLET | ORAL | 0 refills | Status: AC

## 2017-12-21 MED ORDER — NYSTATIN 100000 UNIT/ML MT SUSP: 5.0000 mL | Freq: Four times a day (QID) | OROMUCOSAL | 0 refills | Status: AC

## 2017-12-21 MED ORDER — ALPRAZOLAM 0.25 MG PO TABS: 0.2500 mg | ORAL_TABLET | Freq: Four times a day (QID) | ORAL | 0 refills | Status: AC | PRN

## 2017-12-21 MED ORDER — MORPHINE SULFATE (CONCENTRATE) 10 MG/0.5ML PO SOLN: 5.0000 mg | ORAL | 0 refills | Status: AC | PRN

## 2017-12-21 NOTE — Discharge Summary (Signed)
[employer] - [place] at [facility]   PATIENT NAME: Ashley [name]    MR#:  [id number]  DATE OF BIRTH:  Jan 21, 1956  DATE OF ADMISSION:  12/17/2017 ADMITTING PHYSICIAN: Ramonita LabAruna Gouru, MD  DATE OF DISCHARGE: 12/21/2017  2:10 PM  PRIMARY CARE PHYSICIAN: [name], [name], FNP    ADMISSION DIAGNOSIS:  COPD exacerbation (HCC) [J44.1]  DISCHARGE DIAGNOSIS:  Active Problems:   Acute exacerbation of chronic obstructive pulmonary disease (COPD) (HCC)   SECONDARY DIAGNOSIS:   Past Medical History:  Diagnosis Date  . Anxiety   . COPD (chronic obstructive pulmonary disease) (HCC)   . Hypertension     HOSPITAL COURSE:   1.  Acute on chronic respiratory failure.  Continue oxygen supplementation at home. 2.  COPD exacerbation.  The patient had severe COPD 3 years ago on spirometry.  The patient has numerous hospitalizations recently.  Has not been doing well recently.  I started Roxanol for air hunger.  I started Zoloft and Xanax for anxiety.  Patient symptomatically feeling better.  Patient will be discharged home with hospice following.  Can go back on her nebulizers and inhalers at home.  A quick prednisone taper.  The patient was on 40 mg of Solu-Medrol IV while here. 3.  Anxiety on Xanax and Zoloft 4.  Chronic atrial fibrillation on amiodarone and Eliquis 5.  Severe malnutrition on Ensure 6.  Former smoker 7.  Weakness.  Very limited with movement secondary to shortness of breath.  Patient will go home with hospice care today  DISCHARGE CONDITIONS:   Guarded  CONSULTS OBTAINED:  Treatment Team:  Mertie MooresFleming, Herbon E, MD  DRUG ALLERGIES:   Allergies  Allergen Reactions  . Codeine Nausea Only    DISCHARGE MEDICATIONS:   Allergies as of 12/21/2017      Reactions   Codeine Nausea Only      Medication List    STOP taking these medications   docusate sodium 100 MG capsule Commonly known as:  COLACE     TAKE these medications   albuterol 108 (90 Base)  MCG/ACT inhaler Commonly known as:  PROAIR HFA Inhale 2 puffs into the lungs every 6 (six) hours as needed for wheezing or shortness of breath.   ALPRAZolam 0.25 MG tablet Commonly known as:  XANAX Take 1 tablet (0.25 mg total) by mouth every 6 (six) hours as needed for anxiety. What changed:  when to take this   amiodarone 200 MG tablet Commonly known as:  PACERONE Take 1 tablet (200 mg total) by mouth daily.   apixaban 5 MG Tabs tablet Commonly known as:  ELIQUIS Take 1 tablet (5 mg total) by mouth 2 (two) times daily.   budesonide-formoterol 160-4.5 MCG/ACT inhaler Commonly known as:  SYMBICORT Inhale 2 puffs into the lungs 2 (two) times daily.   feeding supplement (ENSURE ENLIVE) Liqd Take 237 mLs by mouth 3 (three) times daily between meals.   ipratropium-albuterol 0.5-2.5 (3) MG/3ML Soln Commonly known as:  DUONEB Take 3 mLs by nebulization every 6 (six) hours as needed.   morphine CONCENTRATE 10 MG/0.5ML Soln concentrated solution Take 0.25 mLs (5 mg total) by mouth every 2 (two) hours as needed for moderate pain, severe pain or shortness of breath (dyspnea).   nystatin 100000 UNIT/ML suspension Commonly known as:  MYCOSTATIN Take 5 mLs (500,000 Units total) by mouth 4 (four) times daily.   predniSONE 10 MG tablet Commonly known as:  DELTASONE 3 tabs po day 1; 2 tabs po day2; 1 tab po  day3; 12/ tab po day4,5 What changed:  additional instructions   sertraline 50 MG tablet Commonly known as:  ZOLOFT Take 1 tablet (50 mg total) by mouth daily. Start taking on:  12/22/2017   tiotropium 18 MCG inhalation capsule Commonly known as:  SPIRIVA Place 18 mcg into inhaler and inhale daily.            Durable Medical Equipment  (From admission, onward)        Start     Ordered   12/20/17 1525  For home use only DME Hospital bed  Once    Question Answer Comment  Patient has (list medical condition): dx end stage copd on hospice   Bed type Semi-electric       12/20/17 1524       DISCHARGE INSTRUCTIONS:   Follow-up with hospice at home  If you experience worsening of your admission symptoms, develop shortness of breath, life threatening emergency, suicidal or homicidal thoughts you must seek medical attention immediately by calling 911 or calling your MD immediately  if symptoms less severe.  You Must read complete instructions/literature along with all the possible adverse reactions/side effects for all the Medicines you take and that have been prescribed to you. Take any new Medicines after you have completely understood and accept all the possible adverse reactions/side effects.   Please note  You were cared for by a hospitalist during your hospital stay. If you have any questions about your discharge medications or the care you received while you were in the hospital after you are discharged, you can call the unit and asked to speak with the hospitalist on call if the hospitalist that took care of you is not available. Once you are discharged, your primary care physician will handle any further medical issues. Please note that NO REFILLS for any discharge medications will be authorized once you are discharged, as it is imperative that you return to your primary care physician (or establish a relationship with a primary care physician if you do not have one) for your aftercare needs so that they can reassess your need for medications and monitor your lab values.    Today   CHIEF COMPLAINT:   Chief Complaint  Patient presents with  . Shortness of Breath    HISTORY OF PRESENT ILLNESS:  Ashley [name]  is a 61 y.o. female with a known history of end-stage COPD on oxygen presenting back to the hospital with shortness of breath   VITAL SIGNS:  Blood pressure (!) 152/58, pulse 84, temperature 99 F (37.2 C), temperature source Oral, resp. rate (!) 24, height 5\' 2"  (1.575 m), weight 32 kg (70 lb 8 oz), SpO2 93 %.    PHYSICAL EXAMINATION:   GENERAL:  61 y.o.-year-old patient lying in the bed with no acute distress.  EYES: Pupils equal, round, reactive to light and accommodation. No scleral icterus. Extraocular muscles intact.  HEENT: Head atraumatic, normocephalic. Oropharynx and nasopharynx clear.  NECK:  no jugular venous distention. No thyroid enlargement, no tenderness.  LUNGS: Decreased breath sounds bilaterally, no wheezing, rales,rhonchi or crepitation. No use of accessory muscles of respiration.  CARDIOVASCULAR: S1, S2 normal. No murmurs, rubs, or gallops.  ABDOMEN: Soft, non-tender, non-distended. Bowel sounds present. No organomegaly or mass.  EXTREMITIES: No pedal edema, cyanosis, or clubbing.  NEUROLOGIC: Cranial nerves II through XII are intact. Muscle strength 5/5 in all extremities. Sensation intact. Gait not checked.  PSYCHIATRIC: The patient is alert and oriented x 3.  SKIN: No  obvious rash, lesion, or ulcer.   DATA REVIEW:   CBC Recent Labs  Lab 12/17/17 1217  WBC 7.2  HGB 15.3  HCT 46.5  PLT 192    Chemistries  Recent Labs  Lab 12/17/17 1217  NA 141  K 3.9  CL 98*  CO2 36*  GLUCOSE 90  BUN 10  CREATININE 0.57  CALCIUM 9.3  AST 22  ALT 19  ALKPHOS 117  BILITOT 0.8    Management plans discussed with the patient, family and they are in agreement.  CODE STATUS:     Code Status Orders  (From admission, onward)        Start     Ordered   12/19/17 1612  Do not attempt resuscitation (DNR)  Continuous    Question Answer Comment  In the event of cardiac or respiratory ARREST Do not call a "code blue"   In the event of cardiac or respiratory ARREST Do not perform Intubation, CPR, defibrillation or ACLS   In the event of cardiac or respiratory ARREST Use medication by any route, position, wound care, and other measures to relive pain and suffering. May use oxygen, suction and manual treatment of airway obstruction as needed for comfort.      12/19/17 1611    Code Status History     Date Active Date Inactive Code Status Order ID Comments User Context   12/17/2017 17:45 12/19/2017 16:11 Full Code [id number]  Ramonita LabGouru, Aruna, MD Inpatient   12/13/2017 11:43 12/15/2017 15:55 Full Code [id number]  Ramonita LabGouru, Aruna, MD ED   11/27/2017 06:19 12/03/2017 16:25 Full Code [id number]  Arnaldo Nataliamond, Michael S, MD Inpatient   07/08/2017 18:25 07/10/2017 12:49 Full Code [id number]  Altamese DillingVachhani, Vaibhavkumar, MD Inpatient   02/17/2017 14:25 02/19/2017 16:29 Full Code [id number]  Enid BaasKalisetti, Radhika, MD Inpatient   06/01/2016 02:00 06/02/2016 18:06 Full Code [id number]  Gery Prayrosley, Debby, MD Inpatient   09/30/2015 16:04 10/05/2015 15:50 Full Code [id number]  Altamese DillingVachhani, Vaibhavkumar, MD ED   09/22/2015 17:47 09/24/2015 14:04 Full Code [id number]  Enid BaasKalisetti, Radhika, MD Inpatient      TOTAL TIME TAKING CARE OF THIS PATIENT: 35 minutes.    Alford Highlandichard Lurlean Kernen M.D on 12/21/2017 at 3:56 PM  Between 7am to 6pm - Pager - [phone]  After 6pm go to [email or web address] - password [email or web address]  [employer] Office  [phone]  CC: Primary care physician; [name], [name], FNP

## 2017-12-21 NOTE — Care Management Note (Signed)
Case Management Note  Patient Details  Name: Ashley [name] MRN: [id number] Date of Birth: [birth year]  Subjective/Objective:     [place] [place] [facility] requesting an update about when hospital bed will be delivered to [name]'s home per family does not want to take her home until it is delivered. Awaiting reply from Verlon Auwanda Ferguson [facility]                Action/Plan:   Expected Discharge Date:  12/21/17               Expected Discharge Plan:  Home w Hospice Care  In-House Referral:     Discharge planning Services  CM Consult  Post Acute Care Choice:  Hospice Choice offered to:  Patient  DME Arranged:    DME Agency:     HH Arranged:    HH Agency:     Status of Service:     If discussed at [employer] Meetings, dates discussed:    Additional Comments:  Brilee Port A, RN 12/21/2017, 11:12 AM

## 2017-12-21 NOTE — Progress Notes (Signed)
Care management has confirmed that needed equipment is at home.  Pt to be dc home.  Hospice will see her tomorrow or Monday. Dc instructions, DNR and RX sent home with pt.

## 2017-12-21 NOTE — Progress Notes (Signed)
Dc home via w/c. Family at side

## 2017-12-21 NOTE — Discharge Instructions (Signed)

## 2017-12-21 NOTE — Plan of Care (Signed)
  Education: Knowledge of General Education information will improve 12/21/2017 0556 - Progressing by Geri SeminoleJackson, Zoey Bidwell A, RN   Health Behavior/Discharge Planning: Ability to manage health-related needs will improve 12/21/2017 0556 - Progressing by Geri SeminoleJackson, Janeece Blok A, RN   Clinical Measurements: Ability to maintain clinical measurements within normal limits will improve 12/21/2017 0556 - Progressing by Geri SeminoleJackson, Jayceon Troy A, RN Will remain free from infection 12/21/2017 0556 - Progressing by Geri SeminoleJackson, Evonna Stoltz A, RN Diagnostic test results will improve 12/21/2017 0556 - Progressing by Geri SeminoleJackson, Khaliyah Northrop A, RN Respiratory complications will improve 12/21/2017 0556 - Progressing by Geri SeminoleJackson, Emran Molzahn A, RN Cardiovascular complication will be avoided 12/21/2017 0556 - Progressing by Geri SeminoleJackson, Abbigale Mcelhaney A, RN   Nutrition: Adequate nutrition will be maintained 12/21/2017 0556 - Progressing by Geri SeminoleJackson, Taisley Mordan A, RN   Pain Managment: General experience of comfort will improve 12/21/2017 0556 - Progressing by Geri SeminoleJackson, Neo Yepiz A, RN   Activity: Ability to implement measures to reduce episodes of fatigue will improve 12/21/2017 0556 - Progressing by Geri SeminoleJackson, Tracyann Duffell A, RN Ability to tolerate increased activity will improve 12/21/2017 0556 - Progressing by Geri SeminoleJackson, Alam Guterrez A, RN Will verbalize the importance of balancing activity with adequate rest periods 12/21/2017 0556 - Progressing by Geri SeminoleJackson, Glenwood Revoir A, RN   Education: Knowledge of disease or condition will improve 12/21/2017 0556 - Progressing by Geri SeminoleJackson, Leea Rambeau A, RN Knowledge of the prescribed therapeutic regimen will improve 12/21/2017 0556 - Progressing by Geri SeminoleJackson, Mada Sadik A, RN

## 2017-12-21 NOTE — Care Management Note (Signed)
Case Management Note  Patient Details  Name: Ashley [name] MRN: [id number] Date of Birth: 01-Apr-1956  Subjective/Objective:      Hospital bed has been delivered to [name]'s home. [facility] to provide Hospice services in the home.               Action/Plan:   Expected Discharge Date:  12/21/17               Expected Discharge Plan:  Home w Hospice Care  In-House Referral:     Discharge planning Services  CM Consult  Post Acute Care Choice:  Hospice Choice offered to:  Patient  DME Arranged:    DME Agency:     HH Arranged:    HH Agency:     Status of Service:     If discussed at [employer] Meetings, dates discussed:    Additional Comments:  [name] A, RN 12/21/2017, 12:27 PM

## 2018-06-23 DEATH — deceased
